# Patient Record
Sex: Female | Born: 1945 | Race: White | Hispanic: No | Smoking: Current every day smoker
Health system: Southern US, Community
[De-identification: ages and names within clinical notes are randomized; demographics above are authoritative.]

## PROBLEM LIST (undated history)

## (undated) DIAGNOSIS — C801 Malignant (primary) neoplasm, unspecified: Secondary | ICD-10-CM

## (undated) DIAGNOSIS — Z800 Family history of malignant neoplasm of digestive organs: Secondary | ICD-10-CM

## (undated) DIAGNOSIS — I10 Essential (primary) hypertension: Secondary | ICD-10-CM

## (undated) DIAGNOSIS — Z803 Family history of malignant neoplasm of breast: Secondary | ICD-10-CM

## (undated) DIAGNOSIS — Z720 Tobacco use: Secondary | ICD-10-CM

## (undated) HISTORY — DX: Family history of malignant neoplasm of digestive organs: Z80.0

## (undated) HISTORY — DX: Family history of malignant neoplasm of breast: Z80.3

## (undated) HISTORY — PX: HERNIA REPAIR: SHX51

## (undated) HISTORY — PX: OTHER SURGICAL HISTORY: SHX169

## (undated) HISTORY — PX: VESICO-VAGINAL FISTULA REPAIR: SHX5129

## (undated) HISTORY — PX: TONSILLECTOMY: SUR1361

---

## 1998-08-27 ENCOUNTER — Encounter: Payer: Self-pay | Admitting: Hematology and Oncology

## 1998-08-27 ENCOUNTER — Ambulatory Visit (HOSPITAL_COMMUNITY): Admission: RE | Admit: 1998-08-27 | Discharge: 1998-08-27 | Payer: Self-pay | Admitting: Hematology and Oncology

## 1998-12-26 ENCOUNTER — Other Ambulatory Visit: Admission: RE | Admit: 1998-12-26 | Discharge: 1998-12-26 | Payer: Self-pay | Admitting: *Deleted

## 1999-09-11 ENCOUNTER — Ambulatory Visit (HOSPITAL_COMMUNITY): Admission: RE | Admit: 1999-09-11 | Discharge: 1999-09-11 | Payer: Self-pay | Admitting: Hematology and Oncology

## 1999-09-11 ENCOUNTER — Encounter: Payer: Self-pay | Admitting: Hematology and Oncology

## 2002-07-13 ENCOUNTER — Ambulatory Visit (HOSPITAL_COMMUNITY): Admission: RE | Admit: 2002-07-13 | Discharge: 2002-07-13 | Payer: Self-pay | Admitting: Family Medicine

## 2002-07-13 ENCOUNTER — Encounter: Payer: Self-pay | Admitting: Family Medicine

## 2002-07-27 ENCOUNTER — Encounter: Payer: Self-pay | Admitting: Family Medicine

## 2002-07-27 ENCOUNTER — Ambulatory Visit (HOSPITAL_COMMUNITY): Admission: RE | Admit: 2002-07-27 | Discharge: 2002-07-27 | Payer: Self-pay | Admitting: Family Medicine

## 2004-01-09 ENCOUNTER — Ambulatory Visit (HOSPITAL_COMMUNITY): Admission: RE | Admit: 2004-01-09 | Discharge: 2004-01-09 | Payer: Self-pay | Admitting: Family Medicine

## 2007-06-22 ENCOUNTER — Ambulatory Visit (HOSPITAL_COMMUNITY): Admission: RE | Admit: 2007-06-22 | Discharge: 2007-06-22 | Payer: Self-pay | Admitting: Family Medicine

## 2007-08-19 ENCOUNTER — Encounter: Admission: RE | Admit: 2007-08-19 | Discharge: 2007-08-19 | Payer: Self-pay | Admitting: Family Medicine

## 2007-08-19 ENCOUNTER — Encounter (INDEPENDENT_AMBULATORY_CARE_PROVIDER_SITE_OTHER): Payer: Self-pay | Admitting: Radiology

## 2007-08-27 ENCOUNTER — Ambulatory Visit (HOSPITAL_COMMUNITY): Admission: RE | Admit: 2007-08-27 | Discharge: 2007-08-27 | Payer: Self-pay | Admitting: Surgery

## 2007-08-29 ENCOUNTER — Encounter: Admission: RE | Admit: 2007-08-29 | Discharge: 2007-08-29 | Payer: Self-pay | Admitting: Family Medicine

## 2007-09-16 ENCOUNTER — Encounter: Admission: RE | Admit: 2007-09-16 | Discharge: 2007-09-16 | Payer: Self-pay | Admitting: Surgery

## 2007-09-17 ENCOUNTER — Encounter (INDEPENDENT_AMBULATORY_CARE_PROVIDER_SITE_OTHER): Payer: Self-pay | Admitting: Surgery

## 2007-09-17 ENCOUNTER — Ambulatory Visit (HOSPITAL_BASED_OUTPATIENT_CLINIC_OR_DEPARTMENT_OTHER): Admission: RE | Admit: 2007-09-17 | Discharge: 2007-09-18 | Payer: Self-pay | Admitting: Surgery

## 2008-10-24 ENCOUNTER — Ambulatory Visit (HOSPITAL_COMMUNITY): Admission: RE | Admit: 2008-10-24 | Discharge: 2008-10-24 | Payer: Self-pay | Admitting: Family Medicine

## 2010-11-03 ENCOUNTER — Encounter: Payer: Self-pay | Admitting: Family Medicine

## 2010-11-03 ENCOUNTER — Encounter: Payer: Self-pay | Admitting: Internal Medicine

## 2010-11-04 ENCOUNTER — Encounter: Payer: Self-pay | Admitting: Family Medicine

## 2011-02-25 NOTE — Op Note (Signed)
NAME:  CLIMMIE, CRONCE                ACCOUNT NO.:  [id number]   MEDICAL RECORD NO.:  [id number]          PATIENT TYPE:  AMB   LOCATION:  DSC                          FACILITY:  [facility]   PHYSICIAN:  Currie Paris, M.D.DATE OF BIRTH:  1945/10/14   DATE OF PROCEDURE:  09/17/2007  DATE OF DISCHARGE:                               OPERATIVE REPORT   OFFICE MEDICAL RECORD NUMBER:  [id number]   PREOPERATIVE DIAGNOSIS:  Carcinoma, left breast, upper outer quadrant.   POSTOPERATIVE DIAGNOSIS:  Carcinoma, left breast, upper outer quadrant.   OPERATION:  Left total mastectomy.   SURGEON:  Currie Paris, M.D.   ASSISTANT:  Lennie Muckle, M.D.   ANESTHESIA:  General endotracheal.   CLINICAL HISTORY:  This is a 65 year old lady, status post a left  lumpectomy and axillary dissection for a stage II left breast cancer  treated many years ago.  She now has a new left breast cancer and  elected to proceed to mastectomy.  A preoperative sentinel node scan  showed no evidence of sentinel nodes and no plan for sentinel node  evaluation was made for today.  She has already had a prior node  dissection on this side.   DESCRIPTION OF PROCEDURE:  The patient was seen in the holding area and  she had no further questions.   The patient taken to the operating room and after satisfactory general  anesthesia had been obtained, the left breast was prepped and draped.  The time-out was performed.   I outlined an elliptical incision, trying to get fairly wide in the  upper outer quadrant to make sure I was well over the area of the new  tumor.  I raised a skin flap medially to the sternum, superiorly towards  the clavicle, laterally into the axilla and to the latissimus.  I then  made the inferior flap going to just beyond the inframammary fold and  then laterally to the latissimus; this was all done with cautery.  The  breast was then removed, starting medially and working laterally, and  taking the fascia.  I opened the clavipectoral fascia and entered the  axilla, but there were basically no axillary contents present, since she  had had a previous dissection.  I did identify both the long thoracic  and thoracodorsal nerves and there was really no intervening tissue  here.  There was a questionable small node just at the edge of the  pectoralis, which I took out.   The breast was then disconnected from its lateral attachments on the  latissimus; this was sent to Pathology.   The wound was irrigated and checked for hemostasis.  Once I was sure  everything was dry, I placed a 19 Blake drain laterally and it go up  towards the axilla and then over the pectoralis.  A last check was made  for hemostasis and then the incision closed with staples.  The patient  tolerated the procedure well.  There were no operative complications.  All counts were correct.      Currie Paris, M.D.  Electronically Signed  CJS/MEDQ  D:  09/17/2007  T:  09/18/2007  Job:  [id number]   cc:   Corrie Mckusick, M.D.  Lynett Fish, M.D.

## 2011-07-21 LAB — URINALYSIS, ROUTINE W REFLEX MICROSCOPIC
Bilirubin Urine: NEGATIVE
Glucose, UA: NEGATIVE
Ketones, ur: NEGATIVE
Nitrite: NEGATIVE
Protein, ur: NEGATIVE
Specific Gravity, Urine: 1.024
pH: 6

## 2011-07-21 LAB — COMPREHENSIVE METABOLIC PANEL
AST: 17
Albumin: 3.6
BUN: 14
Calcium: 9.3
Creatinine, Ser: 0.77
Glucose, Bld: 141 — ABNORMAL HIGH
Potassium: 4.4
Sodium: 141
Total Bilirubin: 0.6

## 2011-07-21 LAB — CBC
HCT: 38
Hemoglobin: 12.9
MCHC: 33.9
Platelets: 348
RBC: 4.5
WBC: 7

## 2011-07-21 LAB — DIFFERENTIAL
Basophils Absolute: 0.1
Eosinophils Absolute: 0.1 — ABNORMAL LOW
Lymphocytes Relative: 22
Lymphs Abs: 1.6
Monocytes Absolute: 0.6

## 2012-05-27 NOTE — H&P (Signed)
  NTS SOAP Note  Vital Signs:  Vitals as of: 05/27/2012: Systolic 165: Diastolic 106: Heart Rate 100: Temp 97.7F: Height 38ft 1in: Weight 168Lbs 0 Ounces: BMI 32  BMI : 31.74 kg/m2  Subjective: This 66 Years 1 Months old Female presents for screening TCS.  Has not had a colonoscopy in the recent past.  Denies blood per rectum.  No family h/o colon carcinoma.  Also has some dysphagia, with swallowing problems at the cervical region.  No nausea, vomiting.   Review of Symptoms:  Constitutional:unremarkable   Head:unremarkable    Eyes:unremarkable   Nose/Mouth/Throat:unremarkable Cardiovascular:  unremarkable   Respiratory:unremarkable   Genitourinary:unremarkable     Musculoskeletal:unremarkable   Skin:unremarkable Hematolgic/Lymphatic:unremarkable     Allergic/Immunologic:unremarkable     Past Medical History:    Reviewed   Past Medical History  Surgical History: mastectomy, fistula repair from childbirth Medical Problems:  Diabetes, High Blood pressure Allergies: duricef Medications: diovan, janumet, glipizide   Social History:Reviewed  Social History  Preferred Language: English ([place]) Race:  White Ethnicity: Not Hispanic / Latino Age: 66 Years 1 Months Marital Status:  D Alcohol: rarely Recreational drug(s):  No   Smoking Status: Current every day smoker reviewed on 05/27/2012 Started Date: 10/14/1967 Packs per day: 0.50   Family History:  Reviewed   Family History  Is there a family history of:No family h/o colon cancer    Objective Information: General:  Well appearing, well nourished in no distress. Neck:  Supple without lymphadenopathy.  Heart:  RRR, no murmur Lungs:    CTA bilaterally, no wheezes, rhonchi, rales.  Breathing unlabored. Abdomen:Soft, NT/ND, no HSM, no masses.   deferred to procedure  Assessment:Dysphagia, need for screening TCS  Diagnosis &amp;  Procedure: DiagnosisCode: V76.51, ProcedureCode: 16109, DiagnosisCode: 787.20,    Plan:Scheduled for EGD, TCS on 06/04/12.   Patient Education:Alternative treatments to surgery were discussed with patient (and family).  Risks and benefits  of procedure were fully explained to the patient (and family) who gave informed consent. Patient/family questions were addressed.  Follow-up:Pending Surgery

## 2012-06-03 MED ORDER — SODIUM CHLORIDE 0.9 % IV SOLN: INTRAVENOUS | Status: DC

## 2012-06-03 MED ORDER — SODIUM CHLORIDE 0.45 % IV SOLN
INTRAVENOUS | Status: DC
  Administered 2012-06-04: 1000 mL via INTRAVENOUS

## 2012-06-04 ENCOUNTER — Ambulatory Visit (HOSPITAL_COMMUNITY)
Admission: RE | Admit: 2012-06-04 | Discharge: 2012-06-04 | Disposition: A | Discharge status: Home / Self Care | Payer: 59 | Source: Ambulatory Visit | Attending: General Surgery | Admitting: General Surgery

## 2012-06-04 ENCOUNTER — Encounter (HOSPITAL_COMMUNITY): Payer: Self-pay | Admitting: *Deleted

## 2012-06-04 ENCOUNTER — Encounter (HOSPITAL_COMMUNITY)
Admission: RE | Disposition: A | Discharge status: Home / Self Care | Payer: Self-pay | Source: Ambulatory Visit | Attending: General Surgery

## 2012-06-04 DIAGNOSIS — R12 Heartburn: Secondary | ICD-10-CM | POA: Insufficient documentation

## 2012-06-04 DIAGNOSIS — K449 Diaphragmatic hernia without obstruction or gangrene: Secondary | ICD-10-CM | POA: Insufficient documentation

## 2012-06-04 DIAGNOSIS — E119 Type 2 diabetes mellitus without complications: Secondary | ICD-10-CM | POA: Insufficient documentation

## 2012-06-04 DIAGNOSIS — I10 Essential (primary) hypertension: Secondary | ICD-10-CM | POA: Insufficient documentation

## 2012-06-04 DIAGNOSIS — K222 Esophageal obstruction: Secondary | ICD-10-CM | POA: Insufficient documentation

## 2012-06-04 DIAGNOSIS — Z1211 Encounter for screening for malignant neoplasm of colon: Secondary | ICD-10-CM | POA: Insufficient documentation

## 2012-06-04 DIAGNOSIS — A048 Other specified bacterial intestinal infections: Secondary | ICD-10-CM | POA: Insufficient documentation

## 2012-06-04 DIAGNOSIS — K5730 Diverticulosis of large intestine without perforation or abscess without bleeding: Secondary | ICD-10-CM | POA: Insufficient documentation

## 2012-06-04 HISTORY — PX: COLONOSCOPY: SHX5424

## 2012-06-04 HISTORY — DX: Essential (primary) hypertension: I10

## 2012-06-04 HISTORY — DX: Malignant (primary) neoplasm, unspecified: C80.1

## 2012-06-04 HISTORY — PX: ESOPHAGOGASTRODUODENOSCOPY: SHX5428

## 2012-06-04 LAB — GLUCOSE, CAPILLARY
Glucose-Capillary: 181 mg/dL — ABNORMAL HIGH (ref 70–99)
Glucose-Capillary: 140 mg/dL — ABNORMAL HIGH (ref 70–99)

## 2012-06-04 SURGERY — EGD (ESOPHAGOGASTRODUODENOSCOPY)
Anesthesia: Moderate Sedation

## 2012-06-04 MED ORDER — MEPERIDINE HCL 25 MG/ML IJ SOLN
INTRAMUSCULAR | Status: DC | PRN
  Administered 2012-06-04: 50 mg via INTRAVENOUS

## 2012-06-04 MED ORDER — MIDAZOLAM HCL 5 MG/5ML IJ SOLN
INTRAMUSCULAR | Status: DC | PRN
  Administered 2012-06-04: 1 mg via INTRAVENOUS
  Administered 2012-06-04: 3 mg via INTRAVENOUS

## 2012-06-04 MED ORDER — BUTAMBEN-TETRACAINE-BENZOCAINE 2-2-14 % EX AERO
INHALATION_SPRAY | CUTANEOUS | Status: DC | PRN
  Administered 2012-06-04: 2 via TOPICAL

## 2012-06-04 MED ORDER — MIDAZOLAM HCL 5 MG/5ML IJ SOLN
INTRAMUSCULAR | Status: AC
  Filled 2012-06-04: qty 10

## 2012-06-04 MED ORDER — OMEPRAZOLE 20 MG PO CPDR: 20.0000 mg | DELAYED_RELEASE_CAPSULE | Freq: Every day | ORAL | Status: DC

## 2012-06-04 MED ORDER — MEPERIDINE HCL 50 MG/ML IJ SOLN
INTRAMUSCULAR | Status: AC
  Filled 2012-06-04: qty 2

## 2012-06-04 MED ORDER — MEPERIDINE HCL 50 MG/ML IJ SOLN
INTRAMUSCULAR | Status: AC
  Filled 2012-06-04: qty 1

## 2012-06-04 NOTE — Op Note (Signed)
[facility] [address] [place], [address]   ENDOSCOPY PROCEDURE REPORT  PATIENT: Kristine Rodriguez, Kristine Rodriguez  MR#: [id number] BIRTHDATE: 1946-08-16 , 66  yrs. old GENDER: Female ENDOSCOPIST: Franky Macho, MD REFERRED BY:  Assunta Found PROCEDURE DATE:  06/04/2012 PROCEDURE:  EGD w/ biopsy for H.pylori ASA CLASS:     Class II INDICATIONS:  heartburn. MEDICATIONS: Versed 4 mg IV, Demerol 50 mg IV, and Cetacaine spray x 2 TOPICAL ANESTHETIC: Cetacaine Spray  DESCRIPTION OF PROCEDURE: After the risks benefits and alternatives of the procedure were thoroughly explained, informed consent was obtained.  The EC-3890Li ([id number]) and Pentax Gastroscope [id number] endoscope was introduced through the mouth and advanced to the second portion of the duodenum. Without limitations.  The instrument was slowly withdrawn as the mucosa was fully examined.      STOMACH: The mucosa of the stomach appeared normall.  The pylorus was noted to be widely patent.    the stomach was easily distensible. On retroflexion of the scope, a sliding hiatal hernia was noted. A prepyloric biopsy was taken for a CLOtest. The Z line was noted to be at 35 cm from the teeth, the gastroesophageal junction was noted be at 38 cm from the teeth. A noncritical Schatzki's ring was noted. The esophagus was within normal limits. No evidence of Barrett's esophagitis. All air was evacuated from the stomach and esophagus prior to removal the endoscope.     The scope was then withdrawn from the patient and the procedure completed.  COMPLICATIONS: There were no complications. ENDOSCOPIC IMPRESSION: T:  hiatal hernia  RECOMMENDATIONS: await biopsy results    eSigned:  Franky Macho, MD 06/04/2012 10:46 AM   CC:

## 2012-06-04 NOTE — Op Note (Signed)
[facility] [address] [place], [address]   COLONOSCOPY PROCEDURE REPORT  PATIENT: Kristine Rodriguez, Kristine Rodriguez  MR#: [id number] BIRTHDATE: Jul 11, 1946 , 66  yrs. old GENDER: Female ENDOSCOPIST: Franky Macho, MD REFERRED NF:AOZHYQM, John PROCEDURE DATE:  06/04/2012 PROCEDURE:   Colonoscopy, screening ASA CLASS:   Class II INDICATIONS:average risk patient for colon cancer. MEDICATIONS: Demerol 50 mg IV and Versed 4 mg IV  DESCRIPTION OF PROCEDURE:   After the risks benefits and alternatives of the procedure were thoroughly explained, informed consent was obtained.  A digital rectal exam revealed no abnormalities of the rectum.   The EC-[id number] ([id number])  endoscope was introduced through the anus and advanced to the cecum, which was identified by both the appendix and ileocecal valve. No adverse events experienced.   The quality of the prep was adequate, using Trilyte  The instrument was then slowly withdrawn as the colon was fully examined.      COLON FINDINGS: Moderate diverticulosis was noted in the descending colon and sigmoid colon.  Retroflexed views revealed no abnormalities. The time to cecum=6 minutes 0 seconds  Withdrawal time=6 minutes 0 seconds.  The scope was withdrawn and the procedure completed. COMPLICATIONS: There were no complications. ENDOSCOPIC IMPRESSION: Moderate diverticulosis was noted in the descending colon and sigmoid colon  RECOMMENDATIONS: repeat Colonscopy in 10 years.  eSigned:  Franky Macho, MD 06/04/2012 10:39 AM   cc:

## 2012-06-04 NOTE — Interval H&P Note (Signed)
History and Physical Interval Note:  06/04/2012 9:55 AM  Kristine Rodriguez  has presented today for surgery, with the diagnosis of Dysphagia Special screening for malignant neoplasms, colon   The various methods of treatment have been discussed with the patient and family. After consideration of risks, benefits and other options for treatment, the patient has consented to  Procedure(s) (LRB): ESOPHAGOGASTRODUODENOSCOPY (EGD) (N/A) COLONOSCOPY (N/A) as a surgical intervention .  The patient's history has been reviewed, patient examined, no change in status, stable for surgery.  I have reviewed the patient's chart and labs.  Questions were answered to the patient's satisfaction.     Franky Macho A

## 2012-06-08 ENCOUNTER — Encounter (HOSPITAL_COMMUNITY): Payer: Self-pay | Admitting: General Surgery

## 2012-10-26 ENCOUNTER — Other Ambulatory Visit (HOSPITAL_COMMUNITY): Payer: Self-pay | Admitting: Family Medicine

## 2012-10-26 DIAGNOSIS — Z139 Encounter for screening, unspecified: Secondary | ICD-10-CM

## 2012-11-01 ENCOUNTER — Other Ambulatory Visit (HOSPITAL_COMMUNITY): Payer: 59

## 2014-10-27 ENCOUNTER — Other Ambulatory Visit (HOSPITAL_COMMUNITY): Payer: Self-pay | Admitting: Family Medicine

## 2014-10-27 DIAGNOSIS — IMO0002 Reserved for concepts with insufficient information to code with codable children: Secondary | ICD-10-CM

## 2014-10-27 DIAGNOSIS — E782 Mixed hyperlipidemia: Secondary | ICD-10-CM

## 2014-10-27 DIAGNOSIS — E1165 Type 2 diabetes mellitus with hyperglycemia: Secondary | ICD-10-CM

## 2014-10-27 DIAGNOSIS — I10 Essential (primary) hypertension: Secondary | ICD-10-CM

## 2014-11-02 ENCOUNTER — Ambulatory Visit (HOSPITAL_COMMUNITY)
Admission: RE | Admit: 2014-11-02 | Discharge: 2014-11-02 | Disposition: A | Discharge status: Home / Self Care | Payer: 59 | Source: Ambulatory Visit | Attending: Family Medicine | Admitting: Family Medicine

## 2014-11-02 DIAGNOSIS — M810 Age-related osteoporosis without current pathological fracture: Secondary | ICD-10-CM | POA: Diagnosis not present

## 2014-11-02 DIAGNOSIS — Z13820 Encounter for screening for osteoporosis: Secondary | ICD-10-CM | POA: Diagnosis present

## 2014-11-02 DIAGNOSIS — E782 Mixed hyperlipidemia: Secondary | ICD-10-CM

## 2014-11-02 DIAGNOSIS — IMO0002 Reserved for concepts with insufficient information to code with codable children: Secondary | ICD-10-CM

## 2014-11-02 DIAGNOSIS — E559 Vitamin D deficiency, unspecified: Secondary | ICD-10-CM | POA: Insufficient documentation

## 2014-11-02 DIAGNOSIS — Z780 Asymptomatic menopausal state: Secondary | ICD-10-CM | POA: Diagnosis not present

## 2014-11-02 DIAGNOSIS — Z853 Personal history of malignant neoplasm of breast: Secondary | ICD-10-CM | POA: Insufficient documentation

## 2014-11-02 DIAGNOSIS — I10 Essential (primary) hypertension: Secondary | ICD-10-CM

## 2014-11-02 DIAGNOSIS — E1165 Type 2 diabetes mellitus with hyperglycemia: Secondary | ICD-10-CM

## 2016-01-25 DIAGNOSIS — Z1389 Encounter for screening for other disorder: Secondary | ICD-10-CM | POA: Diagnosis not present

## 2016-01-25 DIAGNOSIS — F329 Major depressive disorder, single episode, unspecified: Secondary | ICD-10-CM | POA: Diagnosis not present

## 2016-01-25 DIAGNOSIS — E1165 Type 2 diabetes mellitus with hyperglycemia: Secondary | ICD-10-CM | POA: Diagnosis not present

## 2016-01-25 DIAGNOSIS — E663 Overweight: Secondary | ICD-10-CM | POA: Diagnosis not present

## 2016-01-25 DIAGNOSIS — Z6828 Body mass index (BMI) 28.0-28.9, adult: Secondary | ICD-10-CM | POA: Diagnosis not present

## 2016-01-25 DIAGNOSIS — F4321 Adjustment disorder with depressed mood: Secondary | ICD-10-CM | POA: Diagnosis not present

## 2016-01-25 DIAGNOSIS — I10 Essential (primary) hypertension: Secondary | ICD-10-CM | POA: Diagnosis not present

## 2016-01-25 DIAGNOSIS — E669 Obesity, unspecified: Secondary | ICD-10-CM | POA: Diagnosis not present

## 2016-07-22 DIAGNOSIS — E782 Mixed hyperlipidemia: Secondary | ICD-10-CM | POA: Diagnosis not present

## 2016-07-22 DIAGNOSIS — I10 Essential (primary) hypertension: Secondary | ICD-10-CM | POA: Diagnosis not present

## 2016-07-22 DIAGNOSIS — Z1389 Encounter for screening for other disorder: Secondary | ICD-10-CM | POA: Diagnosis not present

## 2016-07-22 DIAGNOSIS — Z6828 Body mass index (BMI) 28.0-28.9, adult: Secondary | ICD-10-CM | POA: Diagnosis not present

## 2016-07-22 DIAGNOSIS — E1165 Type 2 diabetes mellitus with hyperglycemia: Secondary | ICD-10-CM | POA: Diagnosis not present

## 2016-08-07 DIAGNOSIS — E1165 Type 2 diabetes mellitus with hyperglycemia: Secondary | ICD-10-CM | POA: Diagnosis not present

## 2016-08-07 DIAGNOSIS — E782 Mixed hyperlipidemia: Secondary | ICD-10-CM | POA: Diagnosis not present

## 2016-11-24 DIAGNOSIS — E1165 Type 2 diabetes mellitus with hyperglycemia: Secondary | ICD-10-CM | POA: Diagnosis not present

## 2016-11-24 DIAGNOSIS — E663 Overweight: Secondary | ICD-10-CM | POA: Diagnosis not present

## 2016-11-24 DIAGNOSIS — Z23 Encounter for immunization: Secondary | ICD-10-CM | POA: Diagnosis not present

## 2016-11-24 DIAGNOSIS — Z6827 Body mass index (BMI) 27.0-27.9, adult: Secondary | ICD-10-CM | POA: Diagnosis not present

## 2017-07-08 DIAGNOSIS — Z6825 Body mass index (BMI) 25.0-25.9, adult: Secondary | ICD-10-CM | POA: Diagnosis not present

## 2017-07-08 DIAGNOSIS — E782 Mixed hyperlipidemia: Secondary | ICD-10-CM | POA: Diagnosis not present

## 2017-07-08 DIAGNOSIS — E559 Vitamin D deficiency, unspecified: Secondary | ICD-10-CM | POA: Diagnosis not present

## 2017-07-08 DIAGNOSIS — I10 Essential (primary) hypertension: Secondary | ICD-10-CM | POA: Diagnosis not present

## 2017-07-08 DIAGNOSIS — D508 Other iron deficiency anemias: Secondary | ICD-10-CM | POA: Diagnosis not present

## 2017-07-08 DIAGNOSIS — E1165 Type 2 diabetes mellitus with hyperglycemia: Secondary | ICD-10-CM | POA: Diagnosis not present

## 2017-07-08 DIAGNOSIS — Z23 Encounter for immunization: Secondary | ICD-10-CM | POA: Diagnosis not present

## 2017-07-08 DIAGNOSIS — Z1389 Encounter for screening for other disorder: Secondary | ICD-10-CM | POA: Diagnosis not present

## 2017-07-08 DIAGNOSIS — J449 Chronic obstructive pulmonary disease, unspecified: Secondary | ICD-10-CM | POA: Diagnosis not present

## 2017-07-08 LAB — HEMOGLOBIN A1C: Hemoglobin A1C: 8.3

## 2017-07-10 DIAGNOSIS — E663 Overweight: Secondary | ICD-10-CM | POA: Diagnosis not present

## 2017-07-10 DIAGNOSIS — Z1389 Encounter for screening for other disorder: Secondary | ICD-10-CM | POA: Diagnosis not present

## 2017-07-10 DIAGNOSIS — E782 Mixed hyperlipidemia: Secondary | ICD-10-CM | POA: Diagnosis not present

## 2017-07-10 DIAGNOSIS — Z23 Encounter for immunization: Secondary | ICD-10-CM | POA: Diagnosis not present

## 2017-07-10 DIAGNOSIS — Z6825 Body mass index (BMI) 25.0-25.9, adult: Secondary | ICD-10-CM | POA: Diagnosis not present

## 2017-07-10 DIAGNOSIS — E1165 Type 2 diabetes mellitus with hyperglycemia: Secondary | ICD-10-CM | POA: Diagnosis not present

## 2017-07-13 LAB — BASIC METABOLIC PANEL
BUN: 26 — AB (ref 4–21)
CREATININE: 0.8 (ref <=1.1)

## 2017-07-13 LAB — LIPID PANEL
Cholesterol: 208 — AB (ref 0–200)
HDL: 47 (ref 35–70)
LDL Cholesterol: 84
Triglycerides: 383 — AB (ref 40–160)

## 2017-08-06 ENCOUNTER — Other Ambulatory Visit (HOSPITAL_COMMUNITY): Payer: Self-pay | Admitting: Family Medicine

## 2017-08-06 DIAGNOSIS — Z87891 Personal history of nicotine dependence: Secondary | ICD-10-CM

## 2017-08-06 DIAGNOSIS — Z122 Encounter for screening for malignant neoplasm of respiratory organs: Secondary | ICD-10-CM

## 2017-08-12 ENCOUNTER — Ambulatory Visit (INDEPENDENT_AMBULATORY_CARE_PROVIDER_SITE_OTHER): Payer: PPO | Admitting: "Endocrinology

## 2017-08-12 ENCOUNTER — Encounter: Payer: Self-pay | Admitting: "Endocrinology

## 2017-08-12 VITALS — BP 133/82 | HR 97 | Ht 61.0 in | Wt 137.0 lb

## 2017-08-12 DIAGNOSIS — I10 Essential (primary) hypertension: Secondary | ICD-10-CM | POA: Diagnosis not present

## 2017-08-12 DIAGNOSIS — E1165 Type 2 diabetes mellitus with hyperglycemia: Secondary | ICD-10-CM | POA: Diagnosis not present

## 2017-08-12 DIAGNOSIS — F17200 Nicotine dependence, unspecified, uncomplicated: Secondary | ICD-10-CM | POA: Insufficient documentation

## 2017-08-12 DIAGNOSIS — E782 Mixed hyperlipidemia: Secondary | ICD-10-CM | POA: Diagnosis not present

## 2017-08-12 MED ORDER — METFORMIN HCL 1000 MG PO TABS: 1000.0000 mg | ORAL_TABLET | Freq: Two times a day (BID) | ORAL | 2 refills | Status: DC

## 2017-08-12 NOTE — Patient Instructions (Signed)

## 2017-08-12 NOTE — Progress Notes (Signed)
Consult Note       08/12/2017, 5:20 PM   Subjective:    Patient ID: Kristine Rodriguez, female    DOB: [birth year].  she is being seen in consultation for management of currently uncontrolled symptomatic diabetes requested by  Sharilyn Sites, MD.   Past Medical History:  Diagnosis Date  . Cancer ([place])    hx of breast cancer left  . Diabetes mellitus   . Hypertension    Past Surgical History:  Procedure Laterality Date  . bladder tack    . COLONOSCOPY  06/04/2012   Procedure: COLONOSCOPY;  Surgeon: Jamesetta So, MD;  Location: [facility];  Service: Gastroenterology;  Laterality: N/A;  . ESOPHAGOGASTRODUODENOSCOPY  06/04/2012   Procedure: ESOPHAGOGASTRODUODENOSCOPY (EGD);  Surgeon: Jamesetta So, MD;  Location: [facility];  Service: Gastroenterology;  Laterality: N/A;  . HERNIA REPAIR    . left mastectomy    . TONSILLECTOMY    . VESICO-VAGINAL FISTULA REPAIR     Social History   Social History  . Marital status: Divorced    Spouse name: N/A  . Number of children: N/A  . Years of education: N/A   Social History Main Topics  . Smoking status: Current Every Day Smoker    Packs/day: 0.50    Types: Cigarettes  . Smokeless tobacco: Never Used  . Alcohol use Yes     Comment: socially drink  . Drug use: No  . Sexual activity: Yes   Other Topics Concern  . None   Social History Narrative  . None   Outpatient Encounter Prescriptions as of 08/12/2017  Medication Sig  . albuterol (PROVENTIL) (2.5 MG/3ML) 0.083% nebulizer solution Take 2.5 mg by nebulization every 6 (six) hours as needed for wheezing or shortness of breath.  . Fexofenadine HCl (MUCINEX ALLERGY PO) Take by mouth.  . metFORMIN (GLUCOPHAGE) 1000 MG tablet Take 1 tablet (1,000 mg total) by mouth 2 (two) times daily with a meal.  . Omega-3 Fatty Acids (FISH OIL OMEGA-3 PO) Take by mouth daily.  . valsartan-hydrochlorothiazide  (DIOVAN-HCT) 160-12.5 MG tablet Take 1 tablet by mouth daily.  . Vitamin D, Ergocalciferol, (DRISDOL) 50000 units CAPS capsule Take 50,000 Units by mouth every 7 (seven) days.  . [DISCONTINUED] glipiZIDE (GLUCOTROL) 10 MG tablet Take 10 mg by mouth 1 day or 1 dose.  . [DISCONTINUED] metFORMIN (GLUCOPHAGE) 1000 MG tablet Take 1,000 mg by mouth 2 (two) times daily with a meal.  . omeprazole (PRILOSEC) 20 MG capsule Take 1 capsule (20 mg total) by mouth daily.  . [DISCONTINUED] sitaGLIPtan-metformin (JANUMET) 50-1000 MG per tablet Take 1 tablet by mouth 2 (two) times daily with a meal.  . [DISCONTINUED] valsartan (DIOVAN) 160 MG tablet Take 160 mg by mouth daily.   No facility-administered encounter medications on file as of 08/12/2017.     ALLERGIES: Allergies  Allergen Reactions  . Duricef [Cefadroxil] Shortness Of Breath and Swelling  . Tape Rash    VACCINATION STATUS:  There is no immunization history on file for this patient.  Diabetes  She presents for her initial diabetic visit. She has type 2 diabetes mellitus. Onset  time: She was diagnosed at approximate age of 37 years. Her disease course has been worsening. There are no hypoglycemic associated symptoms. Pertinent negatives for hypoglycemia include no confusion, headaches, pallor or seizures. Associated symptoms include fatigue, polydipsia and polyuria. Pertinent negatives for diabetes include no chest pain and no polyphagia. There are no hypoglycemic complications. Symptoms are worsening. There are no diabetic complications. Risk factors for coronary artery disease include diabetes mellitus, dyslipidemia, family history, hypertension, sedentary lifestyle, tobacco exposure and post-menopausal. Current diabetic treatment includes oral agent (dual therapy). Her weight is decreasing steadily. She is following a generally unhealthy diet. When asked about meal planning, she reported none. She has not had a previous visit with a dietitian.  She participates in exercise intermittently. (She did not bring any meter nor logs to review today. She admits that she does not monitor blood glucose regularly. Her most recent A1c was 8.3% from 07/08/2017.) An ACE inhibitor/angiotensin II receptor blocker is being taken. She does not see a podiatrist.Eye exam is current.  Hyperlipidemia  This is a chronic problem. The current episode started more than 1 year ago. Pertinent negatives include no chest pain, myalgias or shortness of breath. Current antihyperlipidemic treatment includes fibric acid derivatives. Risk factors for coronary artery disease include diabetes mellitus, dyslipidemia, family history, hypertension and a sedentary lifestyle.  Hypertension  This is a chronic problem. The current episode started more than 1 year ago. The problem is controlled. Pertinent negatives include no chest pain, headaches, palpitations or shortness of breath. Risk factors for coronary artery disease include diabetes mellitus, dyslipidemia, smoking/tobacco exposure, sedentary lifestyle and family history. Past treatments include angiotensin blockers.      Review of Systems  Constitutional: Positive for fatigue. Negative for chills, fever and unexpected weight change.  HENT: Negative for trouble swallowing and voice change.   Eyes: Negative for visual disturbance.  Respiratory: Negative for cough, shortness of breath and wheezing.   Cardiovascular: Negative for chest pain, palpitations and leg swelling.  Gastrointestinal: Negative for diarrhea, nausea and vomiting.  Endocrine: Positive for polydipsia and polyuria. Negative for cold intolerance, heat intolerance and polyphagia.  Musculoskeletal: Negative for arthralgias and myalgias.  Skin: Negative for color change, pallor, rash and wound.  Neurological: Negative for seizures and headaches.  Psychiatric/Behavioral: Negative for confusion and suicidal ideas.    Objective:    BP 133/82   Pulse 97   Ht  5' 1"  (1.549 m)   Wt 137 lb (62.1 kg)   BMI 25.89 kg/m   Wt Readings from Last 3 Encounters:  08/12/17 137 lb (62.1 kg)  06/04/12 165 lb (74.8 kg)     Physical Exam  Constitutional: She is oriented to person, place, and time. She appears well-developed.  HENT:  Head: Normocephalic and atraumatic.  Eyes: EOM are normal.  Neck: Normal range of motion. Neck supple. No tracheal deviation present. No thyromegaly present.  Cardiovascular: Normal rate and regular rhythm.   Pulmonary/Chest: Effort normal and breath sounds normal.  Abdominal: Soft. Bowel sounds are normal. There is no tenderness. There is no guarding.  Musculoskeletal: Normal range of motion. She exhibits no edema.  Neurological: She is alert and oriented to person, place, and time. She has normal reflexes. No cranial nerve deficit. Coordination normal.  Skin: Skin is warm and dry. No rash noted. No erythema. No pallor.  Psychiatric: She has a normal mood and affect. Judgment normal.      CMP ( most recent) CMP     Component Value Date/Time  NA 141 09/16/2007 1025   K 4.4 09/16/2007 1025   CL 103 09/16/2007 1025   CO2 28 09/16/2007 1025   GLUCOSE 141 (H) 09/16/2007 1025   BUN 26 (A) 07/13/2017   CREATININE 0.8 07/13/2017   CREATININE 0.77 09/16/2007 1025   CALCIUM 9.3 09/16/2007 1025   PROT 6.8 09/16/2007 1025   ALBUMIN 3.6 09/16/2007 1025   AST 17 09/16/2007 1025   ALT 17 09/16/2007 1025   ALKPHOS 64 09/16/2007 1025   BILITOT 0.6 09/16/2007 1025   GFRNONAA >60 09/16/2007 1025   GFRAA  09/16/2007 1025    >60        The eGFR has been calculated using the MDRD equation. This calculation has not been validated in all clinical   Recent Results (from the past 2160 hour(s))  Hemoglobin A1c     Status: None   Collection Time: 07/08/17 12:00 AM  Result Value Ref Range   Hemoglobin A1C 8.3   Basic metabolic panel     Status: Abnormal   Collection Time: 07/13/17 12:00 AM  Result Value Ref Range   BUN 26 (A)  4 - 21   Creatinine 0.8 0.5 - 1.1  Lipid panel     Status: Abnormal   Collection Time: 07/13/17 12:00 AM  Result Value Ref Range   Triglycerides 383 (A) 40 - 160   Cholesterol 208 (A) 0 - 200   HDL 47 35 - 70   LDL Cholesterol 84       Assessment & Plan:   1. Uncontrolled type 2 diabetes mellitus with hyperglycemia ([place])   - Patient has currently uncontrolled symptomatic type 2 DM since  71 years of age,  with most recent A1c of 8.3 %. Recent labs reviewed.  -her diabetes is complicated by chronic heavy smoking, sedentary life and ASTOU LADA remains at a high risk for more acute and chronic complications which include CAD, CVA, CKD, retinopathy, and neuropathy. These are all discussed in detail with the patient.  - I have counseled her on diet management by adopting a carbohydrate restricted/protein rich diet.  - Suggestion is made for her to avoid simple carbohydrates  from her diet including Cakes, Sweet Desserts, Ice Cream, Soda (diet and regular), Sweet Tea, Candies, Chips, Cookies, Store Bought Juices, Alcohol in Excess of  1-2 drinks a day, Artificial Sweeteners, and "Sugar-free" Products. This will help patient to have stable blood glucose profile and potentially avoid unintended weight gain.  - I encouraged her to switch to  unprocessed or minimally processed complex starch and increased protein intake (animal or plant source), fruits, and vegetables.  - she is advised to stick to a routine mealtimes to eat 3 meals  a day and avoid unnecessary snacks ( to snack only to correct hypoglycemia).   - she will be scheduled with Jearld Fenton, RDN, CDE for individualized diabetes education.  - I have approached her with the following individualized plan to manage diabetes and patient agrees:   - I  will proceed to initiate strict monitoring of glucose 4 times a day-before meals and at bedtime, and return in one week with meter and logs to review. - She'll be considered for  brief insulin treatment if her glycemic profile is significantly above target.  -Patient is encouraged to call clinic for blood glucose levels less than 70 or above 300 mg /dl. - I will continue metformin 1000 g by mouth twice a day, therapeutically suitable for patient . - I will discontinue glipizide,  risk outweighs benefit for this patient.  - she will be considered for incretin therapy as appropriate next visit. - Patient specific target  A1c;  LDL, HDL, Triglycerides, and  Waist Circumference were discussed in detail.  2) BP/HTN: Controlled. Continue current medications including ARB. 3) Lipids/HPL:   Uncontrolled, patient is not on a statin. She is on omega-3 fatty acids given triglyceride level of 383. She'll be considered for low-dose statin  next visit. 4)  Weight/Diet: CDE Consult will be initiated , exercise, and detailed carbohydrates information provided.  5) Chronic Care/Health Maintenance:  -she  is on ARB and not on  Statin medications and  is encouraged to continue to follow up with Ophthalmology, Dentist,  Podiatrist at least yearly or according to recommendations, and advised to  quit smoking. I have recommended yearly flu vaccine and pneumonia vaccination at least every 5 years; moderate intensity exercise for up to 150 minutes weekly; and  sleep for at least 7 hours a day.  - Time spent with the patient: 1 hour, of which >50% was spent in obtaining information about her symptoms, reviewing her previous labs, evaluations, and treatments, counseling her about her   currently uncontrolled type 2 diabetes, hyperlipidemia, and hypertension, and developing a plan for long term treatment; her  questions were answered to her satisfaction.  - Patient to bring meter and  blood glucose logs during her next visit.  - I advised patient to maintain close follow up with Sharilyn Sites, MD for primary care needs.  Follow up plan: - Return in about 1 week (around 08/19/2017) for follow up  with meter and logs- no labs.  Glade Lloyd, MD [facility] [address] [place], [place] [address] Phone: [phone]  Fax: [phone]    08/12/2017, 5:20 PM  This note was partially dictated with voice recognition software. Similar sounding words can be transcribed inadequately or may not  be corrected upon review.

## 2017-08-19 ENCOUNTER — Encounter: Payer: Self-pay | Admitting: "Endocrinology

## 2017-08-19 ENCOUNTER — Ambulatory Visit (INDEPENDENT_AMBULATORY_CARE_PROVIDER_SITE_OTHER): Payer: PPO | Admitting: "Endocrinology

## 2017-08-19 VITALS — BP 119/80 | HR 87 | Ht 61.0 in | Wt 136.0 lb

## 2017-08-19 DIAGNOSIS — F17200 Nicotine dependence, unspecified, uncomplicated: Secondary | ICD-10-CM | POA: Diagnosis not present

## 2017-08-19 DIAGNOSIS — E1165 Type 2 diabetes mellitus with hyperglycemia: Secondary | ICD-10-CM | POA: Diagnosis not present

## 2017-08-19 DIAGNOSIS — E782 Mixed hyperlipidemia: Secondary | ICD-10-CM

## 2017-08-19 DIAGNOSIS — I10 Essential (primary) hypertension: Secondary | ICD-10-CM | POA: Diagnosis not present

## 2017-08-19 NOTE — Patient Instructions (Signed)

## 2017-08-19 NOTE — Progress Notes (Signed)
Consult Note       08/19/2017, 3:40 PM   Subjective:    Patient ID: Kristine Rodriguez, female    DOB: Dec 12, 1945.  she is being seen in consultation for management of currently uncontrolled symptomatic diabetes requested by  Sharilyn Sites, MD.   Past Medical History:  Diagnosis Date  . Cancer ([place])    hx of breast cancer left  . Diabetes mellitus   . Hypertension    Past Surgical History:  Procedure Laterality Date  . bladder tack    . HERNIA REPAIR    . left mastectomy    . TONSILLECTOMY    . VESICO-VAGINAL FISTULA REPAIR     Social History   Socioeconomic History  . Marital status: Divorced    Spouse name: None  . Number of children: None  . Years of education: None  . Highest education level: None  Social Needs  . Financial resource strain: None  . Food insecurity - worry: None  . Food insecurity - inability: None  . Transportation needs - medical: None  . Transportation needs - non-medical: None  Occupational History  . None  Tobacco Use  . Smoking status: Current Every Day Smoker    Packs/day: 0.50    Types: Cigarettes  . Smokeless tobacco: Never Used  Substance and Sexual Activity  . Alcohol use: Yes    Comment: socially drink  . Drug use: No  . Sexual activity: Yes  Other Topics Concern  . None  Social History Narrative  . None   Outpatient Encounter Medications as of 08/19/2017  Medication Sig  . albuterol (PROVENTIL) (2.5 MG/3ML) 0.083% nebulizer solution Take 2.5 mg by nebulization every 6 (six) hours as needed for wheezing or shortness of breath.  . Fexofenadine HCl (MUCINEX ALLERGY PO) Take by mouth.  . metFORMIN (GLUCOPHAGE) 1000 MG tablet Take 1 tablet (1,000 mg total) by mouth 2 (two) times daily with a meal.  . Omega-3 Fatty Acids (FISH OIL OMEGA-3 PO) Take by mouth daily.  Marland Kitchen omeprazole (PRILOSEC) 20 MG capsule Take 1 capsule (20 mg total) by mouth daily.  .  valsartan-hydrochlorothiazide (DIOVAN-HCT) 160-12.5 MG tablet Take 1 tablet by mouth daily.  . Vitamin D, Ergocalciferol, (DRISDOL) 50000 units CAPS capsule Take 50,000 Units by mouth every 7 (seven) days.   No facility-administered encounter medications on file as of 08/19/2017.     ALLERGIES: Allergies  Allergen Reactions  . Duricef [Cefadroxil] Shortness Of Breath and Swelling  . Tape Rash    VACCINATION STATUS:  There is no immunization history on file for this patient.  Diabetes  She presents for her follow-up diabetic visit. She has type 2 diabetes mellitus. Onset time: She was diagnosed at approximate age of 71 years. Her disease course has been improving. There are no hypoglycemic associated symptoms. Pertinent negatives for hypoglycemia include no confusion, headaches, pallor or seizures. Associated symptoms include fatigue. Pertinent negatives for diabetes include no chest pain, no polydipsia, no polyphagia and no polyuria. There are no hypoglycemic complications. Symptoms are improving. There are no diabetic complications. Risk factors for coronary artery disease include diabetes mellitus, dyslipidemia, family history, hypertension,  sedentary lifestyle, tobacco exposure and post-menopausal. Current diabetic treatment includes oral agent (dual therapy). Her weight is stable. She is following a generally unhealthy diet. When asked about meal planning, she reported none. She has not had a previous visit with a dietitian. She participates in exercise intermittently. Her breakfast blood glucose range is generally 140-180 mg/dl. Her lunch blood glucose range is generally 140-180 mg/dl. Her dinner blood glucose range is generally 140-180 mg/dl. Her bedtime blood glucose range is generally 140-180 mg/dl. Her overall blood glucose range is 140-180 mg/dl. ( Her most recent A1c was 8.3% from 07/08/2017.) An ACE inhibitor/angiotensin II receptor blocker is being taken. She does not see a  podiatrist.Eye exam is current.  Hyperlipidemia  This is a chronic problem. The current episode started more than 1 year ago. Pertinent negatives include no chest pain, myalgias or shortness of breath. Current antihyperlipidemic treatment includes fibric acid derivatives. Risk factors for coronary artery disease include diabetes mellitus, dyslipidemia, family history, hypertension and a sedentary lifestyle.  Hypertension  This is a chronic problem. The current episode started more than 1 year ago. The problem is controlled. Pertinent negatives include no chest pain, headaches, palpitations or shortness of breath. Risk factors for coronary artery disease include diabetes mellitus, dyslipidemia, smoking/tobacco exposure, sedentary lifestyle and family history. Past treatments include angiotensin blockers.      Review of Systems  Constitutional: Positive for fatigue. Negative for chills, fever and unexpected weight change.  HENT: Negative for trouble swallowing and voice change.   Eyes: Negative for visual disturbance.  Respiratory: Negative for cough, shortness of breath and wheezing.   Cardiovascular: Negative for chest pain, palpitations and leg swelling.  Gastrointestinal: Negative for diarrhea, nausea and vomiting.  Endocrine: Negative for cold intolerance, heat intolerance, polydipsia, polyphagia and polyuria.  Musculoskeletal: Negative for arthralgias and myalgias.  Skin: Negative for color change, pallor, rash and wound.  Neurological: Negative for seizures and headaches.  Psychiatric/Behavioral: Negative for confusion and suicidal ideas.    Objective:    BP 119/80   Pulse 87   Ht 5' 1"  (1.549 m)   Wt 136 lb (61.7 kg)   BMI 25.70 kg/m   Wt Readings from Last 3 Encounters:  08/19/17 136 lb (61.7 kg)  08/12/17 137 lb (62.1 kg)  06/04/12 165 lb (74.8 kg)     Physical Exam  Constitutional: She is oriented to person, place, and time. She appears well-developed.  HENT:  Head:  Normocephalic and atraumatic.  Eyes: EOM are normal.  Neck: Normal range of motion. Neck supple. No tracheal deviation present. No thyromegaly present.  Cardiovascular: Normal rate and regular rhythm.  Pulmonary/Chest: Effort normal and breath sounds normal.  Abdominal: Soft. Bowel sounds are normal. There is no tenderness. There is no guarding.  Musculoskeletal: Normal range of motion. She exhibits no edema.  Neurological: She is alert and oriented to person, place, and time. She has normal reflexes. No cranial nerve deficit. Coordination normal.  Skin: Skin is warm and dry. No rash noted. No erythema. No pallor.  Psychiatric: She has a normal mood and affect. Judgment normal.      CMP ( most recent) CMP     Component Value Date/Time   NA 141 09/16/2007 1025   K 4.4 09/16/2007 1025   CL 103 09/16/2007 1025   CO2 28 09/16/2007 1025   GLUCOSE 141 (H) 09/16/2007 1025   BUN 26 (A) 07/13/2017   CREATININE 0.8 07/13/2017   CREATININE 0.77 09/16/2007 1025   CALCIUM 9.3 09/16/2007 1025  PROT 6.8 09/16/2007 1025   ALBUMIN 3.6 09/16/2007 1025   AST 17 09/16/2007 1025   ALT 17 09/16/2007 1025   ALKPHOS 64 09/16/2007 1025   BILITOT 0.6 09/16/2007 1025   GFRNONAA >60 09/16/2007 1025   GFRAA  09/16/2007 1025    >60        The eGFR has been calculated using the MDRD equation. This calculation has not been validated in all clinical   Recent Results (from the past 2160 hour(s))  Hemoglobin A1c     Status: None   Collection Time: 07/08/17 12:00 AM  Result Value Ref Range   Hemoglobin A1C 8.3   Basic metabolic panel     Status: Abnormal   Collection Time: 07/13/17 12:00 AM  Result Value Ref Range   BUN 26 (A) 4 - 21   Creatinine 0.8 0.5 - 1.1  Lipid panel     Status: Abnormal   Collection Time: 07/13/17 12:00 AM  Result Value Ref Range   Triglycerides 383 (A) 40 - 160   Cholesterol 208 (A) 0 - 200   HDL 47 35 - 70   LDL Cholesterol 84       Assessment & Plan:   1.  Uncontrolled type 2 diabetes mellitus with hyperglycemia ([place])   - Patient has currently uncontrolled symptomatic type 2 DM since  71 years of age. - She came with significantly improved blood glucose profile close to target. -  her most recent A1c was 8.3%.    -her diabetes is complicated by chronic heavy smoking, sedentary life and ALISSAH REDMON remains at a high risk for more acute and chronic complications which include CAD, CVA, CKD, retinopathy, and neuropathy. These are all discussed in detail with the patient.  - I have counseled her on diet management by adopting a carbohydrate restricted/protein rich diet.  -  Suggestion is made for her to avoid simple carbohydrates  from her diet including Cakes, Sweet Desserts / Pastries, Ice Cream, Soda (diet and regular), Sweet Tea, Candies, Chips, Cookies, Store Bought Juices, Alcohol in Excess of  1-2 drinks a day, Artificial Sweeteners, and "Sugar-free" Products. This will help patient to have stable blood glucose profile and potentially avoid unintended weight gain.   - I encouraged her to switch to  unprocessed or minimally processed complex starch and increased protein intake (animal or plant source), fruits, and vegetables.  - she is advised to stick to a routine mealtimes to eat 3 meals  a day and avoid unnecessary snacks ( to snack only to correct hypoglycemia).   - she will be scheduled with Jearld Fenton, RDN, CDE for individualized diabetes education- consult pending.  - I have approached her with the following individualized plan to manage diabetes and patient agrees:   - Based on her resenting glycemic profile she will not need insulin treatment at this time.  - I will continue metformin 1000 g by mouth twice a day, therapeutically suitable for patient . - I will discontinue glipizide, risk outweighs benefit for this patient.  - she will be considered for incretin therapy as appropriate next visit. - Patient specific target   A1c;  LDL, HDL, Triglycerides, and  Waist Circumference were discussed in detail.  2) BP/HTN:  Controlled. She is advised to continue her current medications including  ARB. 3) Lipids/HPL:   Uncontrolled, patient is not on a statin. She is on omega-3 fatty acids given triglyceride level of 383. She'll be considered for low-dose statin  next visit. 4)  Weight/Diet: CDE Consult will be initiated , exercise, and detailed carbohydrates information provided.  5) Chronic Care/Health Maintenance:  -she  is on ARB and not on  Statin medications and  is encouraged to continue to follow up with Ophthalmology, Dentist,  Podiatrist at least yearly or according to recommendations, and advised to  quit smoking. I have recommended yearly flu vaccine and pneumonia vaccination at least every 5 years; moderate intensity exercise for up to 150 minutes weekly; and  sleep for at least 7 hours a day.  - I advised patient to maintain close follow up with Sharilyn Sites, MD for primary care needs. - Time spent with the patient: 25 min, of which >50% was spent in reviewing her sugar logs , discussing her hypo- and hyper-glycemic episodes, reviewing her current and  previous labs and insulin doses and developing a plan to avoid hypo- and hyper-glycemia.   Follow up plan: - Return in about 10 weeks (around 10/28/2017).  Glade Lloyd, MD [facility] [address] [place], [place] [address] Phone: [phone]  Fax: [phone]    08/19/2017, 3:40 PM  This note was partially dictated with voice recognition software. Similar sounding words can be transcribed inadequately or may not  be corrected upon review.

## 2017-09-23 ENCOUNTER — Ambulatory Visit: Payer: Self-pay | Admitting: Nutrition

## 2017-09-23 ENCOUNTER — Telehealth: Payer: Self-pay | Admitting: Nutrition

## 2017-09-23 NOTE — Telephone Encounter (Signed)
No VM to leave message to reschedule missed appt.

## 2017-10-20 ENCOUNTER — Other Ambulatory Visit: Payer: Self-pay | Admitting: "Endocrinology

## 2017-10-20 DIAGNOSIS — E1165 Type 2 diabetes mellitus with hyperglycemia: Secondary | ICD-10-CM | POA: Diagnosis not present

## 2017-10-21 LAB — COMPREHENSIVE METABOLIC PANEL
A/G RATIO: 1.6 (ref 1.2–2.2)
ALK PHOS: 71 IU/L (ref 39–117)
ALT: 9 IU/L (ref 0–32)
AST: 12 IU/L (ref 0–40)
Albumin: 4.2 g/dL (ref 3.5–4.8)
BILIRUBIN TOTAL: 0.2 mg/dL (ref 0.0–1.2)
BUN/Creatinine Ratio: 24 (ref 12–28)
BUN: 20 mg/dL (ref 8–27)
CHLORIDE: 100 mmol/L (ref 96–106)
CO2: 17 mmol/L — ABNORMAL LOW (ref 20–29)
Calcium: 10.3 mg/dL (ref 8.7–10.3)
Creatinine, Ser: 0.83 mg/dL (ref 0.57–1.00)
GFR calc Af Amer: 83 mL/min/{1.73_m2} (ref >59)
GFR calc non Af Amer: 72 mL/min/{1.73_m2} (ref >59)
GLOBULIN, TOTAL: 2.7 g/dL (ref 1.5–4.5)
Glucose: 191 mg/dL — ABNORMAL HIGH (ref 65–99)
POTASSIUM: 4.9 mmol/L (ref 3.5–5.2)
SODIUM: 137 mmol/L (ref 134–144)
Total Protein: 6.9 g/dL (ref 6.0–8.5)

## 2017-10-21 LAB — MICROALBUMIN, URINE: Microalbumin, Urine: 30.9 ug/mL

## 2017-10-21 LAB — VITAMIN D 25 HYDROXY (VIT D DEFICIENCY, FRACTURES): Vit D, 25-Hydroxy: 22.8 ng/mL — ABNORMAL LOW (ref 30.0–100.0)

## 2017-10-21 LAB — T4 AND TSH
T4 TOTAL: 6.8 ug/dL (ref 4.5–12.0)
TSH: 1.6 u[IU]/mL (ref 0.450–4.500)

## 2017-10-21 LAB — HGB A1C W/O EAG: HEMOGLOBIN A1C: 9.5 % — AB (ref 4.8–5.6)

## 2017-10-27 ENCOUNTER — Ambulatory Visit (INDEPENDENT_AMBULATORY_CARE_PROVIDER_SITE_OTHER): Payer: PPO | Admitting: "Endocrinology

## 2017-10-27 ENCOUNTER — Encounter: Payer: Self-pay | Admitting: "Endocrinology

## 2017-10-27 VITALS — BP 104/82 | HR 97 | Ht 61.0 in | Wt 137.0 lb

## 2017-10-27 DIAGNOSIS — I10 Essential (primary) hypertension: Secondary | ICD-10-CM | POA: Diagnosis not present

## 2017-10-27 DIAGNOSIS — F17200 Nicotine dependence, unspecified, uncomplicated: Secondary | ICD-10-CM

## 2017-10-27 DIAGNOSIS — E1165 Type 2 diabetes mellitus with hyperglycemia: Secondary | ICD-10-CM | POA: Diagnosis not present

## 2017-10-27 DIAGNOSIS — E782 Mixed hyperlipidemia: Secondary | ICD-10-CM | POA: Diagnosis not present

## 2017-10-27 MED ORDER — VALSARTAN-HYDROCHLOROTHIAZIDE 160-12.5 MG PO TABS: 1.0000 | ORAL_TABLET | Freq: Every day | ORAL | 3 refills | Status: DC

## 2017-10-27 NOTE — Progress Notes (Signed)
Consult Note       10/27/2017, 1:40 PM   Subjective:    Patient ID: Kristine Rodriguez, female    DOB: 06-16-1946.  she is being seen in consultation for management of currently uncontrolled symptomatic diabetes requested by  Sharilyn Sites, MD.   Past Medical History:  Diagnosis Date  . Cancer ([place])    hx of breast cancer left  . Diabetes mellitus   . Hypertension    Past Surgical History:  Procedure Laterality Date  . bladder tack    . COLONOSCOPY  06/04/2012   Procedure: COLONOSCOPY;  Surgeon: Jamesetta So, MD;  Location: [facility];  Service: Gastroenterology;  Laterality: N/A;  . ESOPHAGOGASTRODUODENOSCOPY  06/04/2012   Procedure: ESOPHAGOGASTRODUODENOSCOPY (EGD);  Surgeon: Jamesetta So, MD;  Location: [facility];  Service: Gastroenterology;  Laterality: N/A;  . HERNIA REPAIR    . left mastectomy    . TONSILLECTOMY    . VESICO-VAGINAL FISTULA REPAIR     Social History   Socioeconomic History  . Marital status: Divorced    Spouse name: None  . Number of children: None  . Years of education: None  . Highest education level: None  Social Needs  . Financial resource strain: None  . Food insecurity - worry: None  . Food insecurity - inability: None  . Transportation needs - medical: None  . Transportation needs - non-medical: None  Occupational History  . None  Tobacco Use  . Smoking status: Current Every Day Smoker    Packs/day: 0.50    Types: Cigarettes  . Smokeless tobacco: Never Used  Substance and Sexual Activity  . Alcohol use: Yes    Comment: socially drink  . Drug use: No  . Sexual activity: Yes  Other Topics Concern  . None  Social History Narrative  . None   Outpatient Encounter Medications as of 10/27/2017  Medication Sig  . albuterol (PROVENTIL) (2.5 MG/3ML) 0.083% nebulizer solution Take 2.5 mg by nebulization every 6 (six) hours as needed for wheezing or  shortness of breath.  . Fexofenadine HCl (MUCINEX ALLERGY PO) Take by mouth.  . metFORMIN (GLUCOPHAGE) 1000 MG tablet Take 1 tablet (1,000 mg total) by mouth 2 (two) times daily with a meal.  . Omega-3 Fatty Acids (FISH OIL OMEGA-3 PO) Take by mouth daily.  Marland Kitchen omeprazole (PRILOSEC) 20 MG capsule Take 1 capsule (20 mg total) by mouth daily.  . valsartan-hydrochlorothiazide (DIOVAN-HCT) 160-12.5 MG tablet Take 1 tablet by mouth daily.  . Vitamin D, Ergocalciferol, (DRISDOL) 50000 units CAPS capsule Take 50,000 Units by mouth every 7 (seven) days.  . [DISCONTINUED] valsartan-hydrochlorothiazide (DIOVAN-HCT) 160-12.5 MG tablet Take 1 tablet by mouth daily.   No facility-administered encounter medications on file as of 10/27/2017.     ALLERGIES: Allergies  Allergen Reactions  . Duricef [Cefadroxil] Shortness Of Breath and Swelling  . Tape Rash    VACCINATION STATUS:  There is no immunization history on file for this patient.  Diabetes  She presents for her follow-up diabetic visit. She has type 2 diabetes mellitus. Onset time: She was diagnosed at approximate age of 29 years. Her  disease course has been worsening. There are no hypoglycemic associated symptoms. Pertinent negatives for hypoglycemia include no confusion, headaches, pallor or seizures. Associated symptoms include fatigue, polydipsia and polyuria. Pertinent negatives for diabetes include no chest pain and no polyphagia. There are no hypoglycemic complications. Symptoms are worsening. There are no diabetic complications. Risk factors for coronary artery disease include diabetes mellitus, dyslipidemia, family history, hypertension, sedentary lifestyle, tobacco exposure and post-menopausal. Current diabetic treatment includes oral agent (dual therapy). Her weight is stable. She is following a generally unhealthy diet. When asked about meal planning, she reported none. She has not had a previous visit with a dietitian. She participates in  exercise intermittently. Her breakfast blood glucose range is generally 140-180 mg/dl. Her lunch blood glucose range is generally 140-180 mg/dl. Her dinner blood glucose range is generally 140-180 mg/dl. Her bedtime blood glucose range is generally 140-180 mg/dl. Her overall blood glucose range is 140-180 mg/dl. ( Her most recent A1c was 8.3% from 07/08/2017.) An ACE inhibitor/angiotensin II receptor blocker is being taken. She does not see a podiatrist.Eye exam is current.  Hyperlipidemia  This is a chronic problem. The current episode started more than 1 year ago. Pertinent negatives include no chest pain, myalgias or shortness of breath. Current antihyperlipidemic treatment includes fibric acid derivatives. Risk factors for coronary artery disease include diabetes mellitus, dyslipidemia, family history, hypertension and a sedentary lifestyle.  Hypertension  This is a chronic problem. The current episode started more than 1 year ago. The problem is controlled. Pertinent negatives include no chest pain, headaches, palpitations or shortness of breath. Risk factors for coronary artery disease include diabetes mellitus, dyslipidemia, smoking/tobacco exposure, sedentary lifestyle and family history. Past treatments include angiotensin blockers.      Review of Systems  Constitutional: Positive for fatigue. Negative for chills, fever and unexpected weight change.  HENT: Negative for trouble swallowing and voice change.   Eyes: Negative for visual disturbance.  Respiratory: Negative for cough, shortness of breath and wheezing.   Cardiovascular: Negative for chest pain, palpitations and leg swelling.  Gastrointestinal: Negative for diarrhea, nausea and vomiting.  Endocrine: Positive for polydipsia and polyuria. Negative for cold intolerance, heat intolerance and polyphagia.  Musculoskeletal: Negative for arthralgias and myalgias.  Skin: Negative for color change, pallor, rash and wound.  Neurological:  Negative for seizures and headaches.  Psychiatric/Behavioral: Negative for confusion and suicidal ideas.    Objective:    BP 104/82   Pulse 97   Ht 5\' 1"  (1.549 m)   Wt 137 lb (62.1 kg)   BMI 25.89 kg/m   Wt Readings from Last 3 Encounters:  10/27/17 137 lb (62.1 kg)  08/19/17 136 lb (61.7 kg)  08/12/17 137 lb (62.1 kg)     Physical Exam  Constitutional: She is oriented to person, place, and time. She appears well-developed.  HENT:  Head: Normocephalic and atraumatic.  Eyes: EOM are normal.  Neck: Normal range of motion. Neck supple. No tracheal deviation present. No thyromegaly present.  Cardiovascular: Normal rate and regular rhythm.  Pulmonary/Chest: Effort normal and breath sounds normal.  Abdominal: Soft. Bowel sounds are normal. There is no tenderness. There is no guarding.  Musculoskeletal: Normal range of motion. She exhibits no edema.  Neurological: She is alert and oriented to person, place, and time. She has normal reflexes. No cranial nerve deficit. Coordination normal.  Skin: Skin is warm and dry. No rash noted. No erythema. No pallor.  Psychiatric: She has a normal mood and affect. Judgment normal.  CMP ( most recent) CMP     Component Value Date/Time   NA 137 10/20/2017 1321   K 4.9 10/20/2017 1321   CL 100 10/20/2017 1321   CO2 17 (L) 10/20/2017 1321   GLUCOSE 191 (H) 10/20/2017 1321   GLUCOSE 141 (H) 09/16/2007 1025   BUN 20 10/20/2017 1321   CREATININE 0.83 10/20/2017 1321   CALCIUM 10.3 10/20/2017 1321   PROT 6.9 10/20/2017 1321   ALBUMIN 4.2 10/20/2017 1321   AST 12 10/20/2017 1321   ALT 9 10/20/2017 1321   ALKPHOS 71 10/20/2017 1321   BILITOT 0.2 10/20/2017 1321   GFRNONAA 72 10/20/2017 1321   GFRAA 83 10/20/2017 1321   Recent Results (from the past 2160 hour(s))  Comprehensive metabolic panel     Status: Abnormal   Collection Time: 10/20/17  1:21 PM  Result Value Ref Range   Glucose 191 (H) 65 - 99 mg/dL   BUN 20 8 - 27 mg/dL    Creatinine, Ser 0.83 0.57 - 1.00 mg/dL   GFR calc non Af Amer 72 >59 mL/min/1.73   GFR calc Af Amer 83 >59 mL/min/1.73   BUN/Creatinine Ratio 24 12 - 28   Sodium 137 134 - 144 mmol/L   Potassium 4.9 3.5 - 5.2 mmol/L   Chloride 100 96 - 106 mmol/L   CO2 17 (L) 20 - 29 mmol/L   Calcium 10.3 8.7 - 10.3 mg/dL   Total Protein 6.9 6.0 - 8.5 g/dL   Albumin 4.2 3.5 - 4.8 g/dL   Globulin, Total 2.7 1.5 - 4.5 g/dL   Albumin/Globulin Ratio 1.6 1.2 - 2.2   Bilirubin Total 0.2 0.0 - 1.2 mg/dL   Alkaline Phosphatase 71 39 - 117 IU/L   AST 12 0 - 40 IU/L   ALT 9 0 - 32 IU/L  T4 AND TSH     Status: None   Collection Time: 10/20/17  1:21 PM  Result Value Ref Range   TSH 1.600 0.450 - 4.500 uIU/mL   T4, Total 6.8 4.5 - 12.0 ug/dL  Hgb A1c w/o eAG     Status: Abnormal   Collection Time: 10/20/17  1:21 PM  Result Value Ref Range   Hgb A1c MFr Bld 9.5 (H) 4.8 - 5.6 %    Comment:          Prediabetes: 5.7 - 6.4          Diabetes: >6.4          Glycemic control for adults with diabetes: <7.0   VITAMIN D 25 Hydroxy (Vit-D Deficiency, Fractures)     Status: Abnormal   Collection Time: 10/20/17  1:21 PM  Result Value Ref Range   Vit D, 25-Hydroxy 22.8 (L) 30.0 - 100.0 ng/mL    Comment: Vitamin D deficiency has been defined by the Institute of Medicine and an Endocrine Society practice guideline as a level of serum 25-OH vitamin D less than 20 ng/mL (1,2). The Endocrine Society went on to further define vitamin D insufficiency as a level between 21 and 29 ng/mL (2). 1. IOM (Institute of Medicine). 2010. Dietary reference    intakes for calcium and D. [place]: The    [employer]. 2. Holick MF, Binkley Milburn, Bischoff-Ferrari HA, et Kristine.    Evaluation, treatment, and prevention of vitamin D    deficiency: an Endocrine Society clinical practice    guideline. JCEM. 2011 Jul; 96(7):1911-30.   Microalbumin, urine     Status: None   Collection Time:  10/20/17  1:21 PM  Result Value Ref  Range   Albumin, Urine 30.9 Not Estab. ug/mL      Assessment & Plan:   1. Uncontrolled type 2 diabetes mellitus with hyperglycemia ([place])  - Patient has currently uncontrolled symptomatic type 2 DM since  72 years of age. - She came with significantly  elevated A1c of 9.5% from 8.3%.  She admits to significant dietary indiscretion over the holiday season.   -her diabetes is complicated by chronic heavy smoking, sedentary life and Kristine Rodriguez remains at a high risk for more acute and chronic complications which include CAD, CVA, CKD, retinopathy, and neuropathy. These are all discussed in detail with the patient.  - I have counseled her on diet management by adopting a carbohydrate restricted/protein rich diet.  -  Suggestion is made for her to avoid simple carbohydrates  from her diet including Cakes, Sweet Desserts / Pastries, Ice Cream, Soda (diet and regular), Sweet Tea, Candies, Chips, Cookies, Store Bought Juices, Alcohol in Excess of  1-2 drinks a day, Artificial Sweeteners, and "Sugar-free" Products. This will help patient to have stable blood glucose profile and potentially avoid unintended weight gain.   - I encouraged her to switch to  unprocessed or minimally processed complex starch and increased protein intake (animal or plant source), fruits, and vegetables.  - she is advised to stick to a routine mealtimes to eat 3 meals  a day and avoid unnecessary snacks ( to snack only to correct hypoglycemia).   - she will be scheduled with Jearld Fenton, RDN, CDE for individualized diabetes education- consult pending.  - I have approached her with the following individualized plan to manage diabetes and patient agrees:   - Based on her presenting high A1c of 9.5%, she was approached for initiation of basal insulin. However she declined this offer for now. She promises to do better on her diet.  - I will continue metformin 1000 mg by mouth twice a day, therapeutically suitable for  patient .   - she will be considered for incretin therapy as appropriate next visit. - Patient specific target  A1c;  LDL, HDL, Triglycerides, and  Waist Circumference were discussed in detail.  2) BP/HTN:  Her blood pressure is controlled to target.  She is advised to continue her current medications including  ARB. 3) Lipids/HPL:   Uncontrolled, patient is not on a statin. She is on omega-3 fatty acids given triglyceride level of 383. She'll be considered for low-dose statin  next visit. 4)  Weight/Diet: CDE Consult will be initiated , exercise, and detailed carbohydrates information provided.  5) Chronic Care/Health Maintenance:  -she  is on ARB and not on  Statin medications and  is encouraged to continue to follow up with Ophthalmology, Dentist,  Podiatrist at least yearly or according to recommendations, and advised to  quit smoking. I have recommended yearly flu vaccine and pneumonia vaccination at least every 5 years; moderate intensity exercise for up to 150 minutes weekly; and  sleep for at least 7 hours a day.  - I advised patient to maintain close follow up with Sharilyn Sites, MD for primary care needs.    Follow up plan: - Return in about 2 weeks (around 11/10/2017) for follow up with meter and logs- no labs.  Glade Lloyd, MD [facility] [address] [place], [place] [address] Phone: [phone]  Fax: [phone]    10/27/2017, 1:40 PM  This note was partially dictated  with voice recognition software. Similar sounding words can be transcribed inadequately or may not  be corrected upon review.

## 2017-10-27 NOTE — Patient Instructions (Signed)

## 2017-11-05 ENCOUNTER — Other Ambulatory Visit: Payer: Self-pay | Admitting: "Endocrinology

## 2017-11-10 ENCOUNTER — Encounter: Payer: Self-pay | Admitting: "Endocrinology

## 2017-11-10 ENCOUNTER — Ambulatory Visit (INDEPENDENT_AMBULATORY_CARE_PROVIDER_SITE_OTHER): Payer: PPO | Admitting: "Endocrinology

## 2017-11-10 VITALS — BP 138/85 | HR 93 | Ht 61.0 in | Wt 133.0 lb

## 2017-11-10 DIAGNOSIS — E782 Mixed hyperlipidemia: Secondary | ICD-10-CM

## 2017-11-10 DIAGNOSIS — E1165 Type 2 diabetes mellitus with hyperglycemia: Secondary | ICD-10-CM | POA: Diagnosis not present

## 2017-11-10 DIAGNOSIS — F17200 Nicotine dependence, unspecified, uncomplicated: Secondary | ICD-10-CM | POA: Diagnosis not present

## 2017-11-10 DIAGNOSIS — I10 Essential (primary) hypertension: Secondary | ICD-10-CM

## 2017-11-10 MED ORDER — INSULIN PEN NEEDLE 31G X 8 MM MISC: 1.0000 | 3 refills | Status: DC

## 2017-11-10 MED ORDER — INSULIN GLARGINE 300 UNIT/ML ~~LOC~~ SOPN: 20.0000 [IU] | PEN_INJECTOR | Freq: Every day | SUBCUTANEOUS | 2 refills | Status: DC

## 2017-11-10 NOTE — Progress Notes (Signed)
Consult Note       11/10/2017, 1:36 PM   Subjective:    Patient ID: Kristine Rodriguez, female    DOB: Jun 20, 1946.  she is being seen in consultation for management of currently uncontrolled symptomatic diabetes requested by  Sharilyn Sites, MD.   Past Medical History:  Diagnosis Date  . Cancer ([place])    hx of breast cancer left  . Diabetes mellitus   . Hypertension    Past Surgical History:  Procedure Laterality Date  . bladder tack    . COLONOSCOPY  06/04/2012   Procedure: COLONOSCOPY;  Surgeon: Jamesetta So, MD;  Location: [facility];  Service: Gastroenterology;  Laterality: N/A;  . ESOPHAGOGASTRODUODENOSCOPY  06/04/2012   Procedure: ESOPHAGOGASTRODUODENOSCOPY (EGD);  Surgeon: Jamesetta So, MD;  Location: [facility];  Service: Gastroenterology;  Laterality: N/A;  . HERNIA REPAIR    . left mastectomy    . TONSILLECTOMY    . VESICO-VAGINAL FISTULA REPAIR     Social History   Socioeconomic History  . Marital status: Divorced    Spouse name: None  . Number of children: None  . Years of education: None  . Highest education level: None  Social Needs  . Financial resource strain: None  . Food insecurity - worry: None  . Food insecurity - inability: None  . Transportation needs - medical: None  . Transportation needs - non-medical: None  Occupational History  . None  Tobacco Use  . Smoking status: Current Every Day Smoker    Packs/day: 0.50    Types: Cigarettes  . Smokeless tobacco: Never Used  Substance and Sexual Activity  . Alcohol use: Yes    Comment: socially drink  . Drug use: No  . Sexual activity: Yes  Other Topics Concern  . None  Social History Narrative  . None   Outpatient Encounter Medications as of 11/10/2017  Medication Sig  . albuterol (PROVENTIL) (2.5 MG/3ML) 0.083% nebulizer solution Take 2.5 mg by nebulization every 6 (six) hours as needed for wheezing or  shortness of breath.  . Fexofenadine HCl (MUCINEX ALLERGY PO) Take by mouth.  . Insulin Glargine (TOUJEO SOLOSTAR) 300 UNIT/ML SOPN Inject 20 Units into the skin at bedtime.  . Insulin Pen Needle (B-D ULTRAFINE III SHORT PEN) 31G X 8 MM MISC 1 each by Does not apply route as directed.  . metFORMIN (GLUCOPHAGE) 1000 MG tablet take 1 tablet twice a day with food  . Omega-3 Fatty Acids (FISH OIL OMEGA-3 PO) Take by mouth daily.  Marland Kitchen omeprazole (PRILOSEC) 20 MG capsule Take 1 capsule (20 mg total) by mouth daily.  . valsartan-hydrochlorothiazide (DIOVAN-HCT) 160-12.5 MG tablet Take 1 tablet by mouth daily.  . Vitamin D, Ergocalciferol, (DRISDOL) 50000 units CAPS capsule Take 50,000 Units by mouth every 7 (seven) days.   No facility-administered encounter medications on file as of 11/10/2017.     ALLERGIES: Allergies  Allergen Reactions  . Duricef [Cefadroxil] Shortness Of Breath and Swelling  . Tape Rash    VACCINATION STATUS:  There is no immunization history on file for this patient.  Diabetes  She presents for her follow-up  diabetic visit. She has type 2 diabetes mellitus. Onset time: She was diagnosed at approximate age of 9 years. Her disease course has been worsening. There are no hypoglycemic associated symptoms. Pertinent negatives for hypoglycemia include no confusion, headaches, pallor or seizures. Associated symptoms include fatigue, polydipsia and polyuria. Pertinent negatives for diabetes include no chest pain and no polyphagia. There are no hypoglycemic complications. Symptoms are worsening. There are no diabetic complications. Risk factors for coronary artery disease include diabetes mellitus, dyslipidemia, family history, hypertension, sedentary lifestyle, tobacco exposure and post-menopausal. Current diabetic treatment includes oral agent (dual therapy). Her weight is decreasing steadily. She is following a generally unhealthy diet. When asked about meal planning, she reported  none. She has not had a previous visit with a dietitian. She participates in exercise intermittently. Her breakfast blood glucose range is generally 180-200 mg/dl. Her lunch blood glucose range is generally 180-200 mg/dl. Her dinner blood glucose range is generally 180-200 mg/dl. Her bedtime blood glucose range is generally 180-200 mg/dl. Her overall blood glucose range is 180-200 mg/dl. An ACE inhibitor/angiotensin II receptor blocker is being taken. She does not see a podiatrist.Eye exam is current.  Hyperlipidemia  This is a chronic problem. The current episode started more than 1 year ago. Pertinent negatives include no chest pain, myalgias or shortness of breath. Current antihyperlipidemic treatment includes fibric acid derivatives. Risk factors for coronary artery disease include diabetes mellitus, dyslipidemia, family history, hypertension and a sedentary lifestyle.  Hypertension  This is a chronic problem. The current episode started more than 1 year ago. The problem is controlled. Pertinent negatives include no chest pain, headaches, palpitations or shortness of breath. Risk factors for coronary artery disease include diabetes mellitus, dyslipidemia, smoking/tobacco exposure, sedentary lifestyle and family history. Past treatments include angiotensin blockers.      Review of Systems  Constitutional: Positive for fatigue. Negative for chills, fever and unexpected weight change.  HENT: Negative for trouble swallowing and voice change.   Eyes: Negative for visual disturbance.  Respiratory: Negative for cough, shortness of breath and wheezing.   Cardiovascular: Negative for chest pain, palpitations and leg swelling.  Gastrointestinal: Negative for diarrhea, nausea and vomiting.  Endocrine: Positive for polydipsia and polyuria. Negative for cold intolerance, heat intolerance and polyphagia.  Musculoskeletal: Negative for arthralgias and myalgias.  Skin: Negative for color change, pallor, rash  and wound.  Neurological: Negative for seizures and headaches.  Psychiatric/Behavioral: Negative for confusion and suicidal ideas.    Objective:    BP 138/85   Pulse 93   Ht 5\' 1"  (1.549 m)   Wt 133 lb (60.3 kg)   BMI 25.13 kg/m   Wt Readings from Last 3 Encounters:  11/10/17 133 lb (60.3 kg)  10/27/17 137 lb (62.1 kg)  08/19/17 136 lb (61.7 kg)     Physical Exam  Constitutional: She is oriented to person, place, and time. She appears well-developed.  HENT:  Head: Normocephalic and atraumatic.  Eyes: EOM are normal.  Neck: Normal range of motion. Neck supple. No tracheal deviation present. No thyromegaly present.  Cardiovascular: Normal rate and regular rhythm.  Pulmonary/Chest: Effort normal and breath sounds normal.  Abdominal: Soft. Bowel sounds are normal. There is no tenderness. There is no guarding.  Musculoskeletal: Normal range of motion. She exhibits no edema.  Neurological: She is alert and oriented to person, place, and time. She has normal reflexes. No cranial nerve deficit. Coordination normal.  Skin: Skin is warm and dry. No rash noted. No erythema. No pallor.  Psychiatric: She has a normal mood and affect. Judgment normal.    CMP ( most recent) CMP     Component Value Date/Time   NA 137 10/20/2017 1321   K 4.9 10/20/2017 1321   CL 100 10/20/2017 1321   CO2 17 (L) 10/20/2017 1321   GLUCOSE 191 (H) 10/20/2017 1321   GLUCOSE 141 (H) 09/16/2007 1025   BUN 20 10/20/2017 1321   CREATININE 0.83 10/20/2017 1321   CALCIUM 10.3 10/20/2017 1321   PROT 6.9 10/20/2017 1321   ALBUMIN 4.2 10/20/2017 1321   AST 12 10/20/2017 1321   ALT 9 10/20/2017 1321   ALKPHOS 71 10/20/2017 1321   BILITOT 0.2 10/20/2017 1321   GFRNONAA 72 10/20/2017 1321   GFRAA 83 10/20/2017 1321   Recent Results (from the past 2160 hour(s))  Comprehensive metabolic panel     Status: Abnormal   Collection Time: 10/20/17  1:21 PM  Result Value Ref Range   Glucose 191 (H) 65 - 99 mg/dL    BUN 20 8 - 27 mg/dL   Creatinine, Ser 0.83 0.57 - 1.00 mg/dL   GFR calc non Af Amer 72 >59 mL/min/1.73   GFR calc Af Amer 83 >59 mL/min/1.73   BUN/Creatinine Ratio 24 12 - 28   Sodium 137 134 - 144 mmol/L   Potassium 4.9 3.5 - 5.2 mmol/L   Chloride 100 96 - 106 mmol/L   CO2 17 (L) 20 - 29 mmol/L   Calcium 10.3 8.7 - 10.3 mg/dL   Total Protein 6.9 6.0 - 8.5 g/dL   Albumin 4.2 3.5 - 4.8 g/dL   Globulin, Total 2.7 1.5 - 4.5 g/dL   Albumin/Globulin Ratio 1.6 1.2 - 2.2   Bilirubin Total 0.2 0.0 - 1.2 mg/dL   Alkaline Phosphatase 71 39 - 117 IU/L   AST 12 0 - 40 IU/L   ALT 9 0 - 32 IU/L  T4 AND TSH     Status: None   Collection Time: 10/20/17  1:21 PM  Result Value Ref Range   TSH 1.600 0.450 - 4.500 uIU/mL   T4, Total 6.8 4.5 - 12.0 ug/dL  Hgb A1c w/o eAG     Status: Abnormal   Collection Time: 10/20/17  1:21 PM  Result Value Ref Range   Hgb A1c MFr Bld 9.5 (H) 4.8 - 5.6 %    Comment:          Prediabetes: 5.7 - 6.4          Diabetes: >6.4          Glycemic control for adults with diabetes: <7.0   VITAMIN D 25 Hydroxy (Vit-D Deficiency, Fractures)     Status: Abnormal   Collection Time: 10/20/17  1:21 PM  Result Value Ref Range   Vit D, 25-Hydroxy 22.8 (L) 30.0 - 100.0 ng/mL    Comment: Vitamin D deficiency has been defined by the Institute of Medicine and an Endocrine Society practice guideline as a level of serum 25-OH vitamin D less than 20 ng/mL (1,2). The Endocrine Society went on to further define vitamin D insufficiency as a level between 21 and 29 ng/mL (2). 1. IOM (Institute of Medicine). 2010. Dietary reference    intakes for calcium and D. [place]: The    [employer]. 2. Holick MF, Binkley Stickney, Bischoff-Ferrari HA, et al.    Evaluation, treatment, and prevention of vitamin D    deficiency: an Endocrine Society clinical practice    guideline. JCEM. 2011 Jul; 96(7):1911-30.  Microalbumin, urine     Status: None   Collection Time: 10/20/17  1:21  PM  Result Value Ref Range   Microalbumin, Urine 30.9 Not Estab. ug/mL      Assessment & Plan:   1. Uncontrolled type 2 diabetes mellitus with hyperglycemia ([place])  - Patient has currently uncontrolled symptomatic type 2 DM since  72 years of age. - She came with significantly  Above target glucose readings , both fasting and post prandial, her recent  A1c of 9.5% from 8.3%.  She admits to significant dietary indiscretion over the holiday season.   -her diabetes is complicated by chronic heavy smoking, sedentary life and Kristine Rodriguez remains at a high risk for more acute and chronic complications which include CAD, CVA, CKD, retinopathy, and neuropathy. These are all discussed in detail with the patient.  - I have counseled her on diet management by adopting a carbohydrate restricted/protein rich diet.  -  Suggestion is made for her to avoid simple carbohydrates  from her diet including Cakes, Sweet Desserts / Pastries, Ice Cream, Soda (diet and regular), Sweet Tea, Candies, Chips, Cookies, Store Bought Juices, Alcohol in Excess of  1-2 drinks a day, Artificial Sweeteners, and "Sugar-free" Products. This will help patient to have stable blood glucose profile and potentially avoid unintended weight gain.  - I encouraged her to switch to  unprocessed or minimally processed complex starch and increased protein intake (animal or plant source), fruits, and vegetables.  - she is advised to stick to a routine mealtimes to eat 3 meals  a day and avoid unnecessary snacks ( to snack only to correct hypoglycemia).   - she will be scheduled with Jearld Fenton, RDN, CDE for individualized diabetes education- consult pending.  - I have approached her with the following individualized plan to manage diabetes and patient agrees:   - Based on her presenting glycemia burden and high A1c of 9.5%, she is  approached for initiation of basal insulin, and accepts this time. - I discussed and initiated basal  insulin Toujeo 20 units qhs, associated with strict monitoring of glucose ac and hs. - I will continue metformin 1000 mg by mouth twice a day, therapeutically suitable for patient .  - Patient specific target  A1c;  LDL, HDL, Triglycerides, and  Waist Circumference were discussed in detail.  2) BP/HTN:  Her blood pressure is controlled to target.  She is advised to continue her current medications including  ARB. 3) Lipids/HPL:   Uncontrolled, patient is not on a statin. She is on omega-3 fatty acids given triglyceride level of 383. She'll be considered for low-dose statin  next visit. 4)  Weight/Diet: CDE Consult will be initiated , exercise, and detailed carbohydrates information provided.  5) Chronic Care/Health Maintenance:  -she  is on ARB and not on  Statin medications and  is encouraged to continue to follow up with Ophthalmology, Dentist,  Podiatrist at least yearly or according to recommendations, and advised to  quit smoking. I have recommended yearly flu vaccine and pneumonia vaccination at least every 5 years; moderate intensity exercise for up to 150 minutes weekly; and  sleep for at least 7 hours a day.  - Time spent with the patient: 25 min, of which >50% was spent in reviewing her blood glucose logs , discussing her hypo- and hyper-glycemic episodes, reviewing her current and  previous labs and insulin doses and developing a plan to avoid hypo- and hyper-glycemia. Please refer to Patient Instructions for Blood Glucose  Monitoring and Insulin/Medications Dosing Guide"  in media tab for additional information.  - I advised patient to maintain close follow up with Sharilyn Sites, MD for primary care needs.    Follow up plan: - Return in about 2 weeks (around 11/24/2017) for follow up with meter and logs- no labs.  Glade Lloyd, MD [facility] [address] [place], [place] [address] Phone: [phone]  Fax: [phone]     11/10/2017, 1:36 PM  This note was partially dictated with voice recognition software. Similar sounding words can be transcribed inadequately or may not  be corrected upon review.

## 2017-11-10 NOTE — Patient Instructions (Signed)

## 2017-11-11 ENCOUNTER — Other Ambulatory Visit: Payer: Self-pay

## 2017-11-11 MED ORDER — INSULIN PEN NEEDLE 31G X 8 MM MISC: 1.0000 | Freq: Every day | 3 refills | Status: DC

## 2017-11-24 ENCOUNTER — Encounter: Payer: Self-pay | Admitting: "Endocrinology

## 2017-11-24 ENCOUNTER — Ambulatory Visit (INDEPENDENT_AMBULATORY_CARE_PROVIDER_SITE_OTHER): Payer: PPO | Admitting: "Endocrinology

## 2017-11-24 VITALS — BP 133/77 | HR 90 | Ht 61.0 in | Wt 133.0 lb

## 2017-11-24 DIAGNOSIS — E1165 Type 2 diabetes mellitus with hyperglycemia: Secondary | ICD-10-CM

## 2017-11-24 DIAGNOSIS — F17200 Nicotine dependence, unspecified, uncomplicated: Secondary | ICD-10-CM | POA: Diagnosis not present

## 2017-11-24 DIAGNOSIS — I10 Essential (primary) hypertension: Secondary | ICD-10-CM

## 2017-11-24 DIAGNOSIS — E782 Mixed hyperlipidemia: Secondary | ICD-10-CM

## 2017-11-24 NOTE — Progress Notes (Signed)
Consult Note       11/24/2017, 12:41 PM   Subjective:    Patient ID: Kristine Rodriguez, female    DOB: Mar 02, 1946.  she is being seen in consultation for management of currently uncontrolled symptomatic diabetes requested by  Sharilyn Sites, MD.   Past Medical History:  Diagnosis Date  . Cancer ([place])    hx of breast cancer left  . Diabetes mellitus   . Hypertension    Past Surgical History:  Procedure Laterality Date  . bladder tack    . COLONOSCOPY  06/04/2012   Procedure: COLONOSCOPY;  Surgeon: Jamesetta So, MD;  Location: [facility];  Service: Gastroenterology;  Laterality: N/A;  . ESOPHAGOGASTRODUODENOSCOPY  06/04/2012   Procedure: ESOPHAGOGASTRODUODENOSCOPY (EGD);  Surgeon: Jamesetta So, MD;  Location: [facility];  Service: Gastroenterology;  Laterality: N/A;  . HERNIA REPAIR    . left mastectomy    . TONSILLECTOMY    . VESICO-VAGINAL FISTULA REPAIR     Social History   Socioeconomic History  . Marital status: Divorced    Spouse name: None  . Number of children: None  . Years of education: None  . Highest education level: None  Social Needs  . Financial resource strain: None  . Food insecurity - worry: None  . Food insecurity - inability: None  . Transportation needs - medical: None  . Transportation needs - non-medical: None  Occupational History  . None  Tobacco Use  . Smoking status: Current Every Day Smoker    Packs/day: 0.50    Types: Cigarettes  . Smokeless tobacco: Never Used  Substance and Sexual Activity  . Alcohol use: Yes    Comment: socially drink  . Drug use: No  . Sexual activity: Yes  Other Topics Concern  . None  Social History Narrative  . None   Outpatient Encounter Medications as of 11/24/2017  Medication Sig  . albuterol (PROVENTIL) (2.5 MG/3ML) 0.083% nebulizer solution Take 2.5 mg by nebulization every 6 (six) hours as needed for wheezing or  shortness of breath.  . Fexofenadine HCl (MUCINEX ALLERGY PO) Take by mouth.  . Insulin Glargine (TOUJEO SOLOSTAR) 300 UNIT/ML SOPN Inject 20 Units into the skin at bedtime.  . Insulin Pen Needle (B-D ULTRAFINE III SHORT PEN) 31G X 8 MM MISC 1 each by Does not apply route at bedtime.  . metFORMIN (GLUCOPHAGE) 1000 MG tablet take 1 tablet twice a day with food  . Omega-3 Fatty Acids (FISH OIL OMEGA-3 PO) Take by mouth daily.  Mar[place] Kitchen omeprazole (PRILOSEC) 20 MG capsule Take 1 capsule (20 mg total) by mouth daily.  . valsartan-hydrochlorothiazide (DIOVAN-HCT) 160-12.5 MG tablet Take 1 tablet by mouth daily.  . Vitamin D, Ergocalciferol, (DRISDOL) 50000 units CAPS capsule Take 50,000 Units by mouth every 7 (seven) days.   No facility-administered encounter medications on file as of 11/24/2017.     ALLERGIES: Allergies  Allergen Reactions  . Duricef [Cefadroxil] Shortness Of Breath and Swelling  . Tape Rash    VACCINATION STATUS:  There is no immunization history on file for this patient.  Diabetes  She presents for her follow-up  diabetic visit. She has type 2 diabetes mellitus. Onset time: She was diagnosed at approximate age of 67 years. Her disease course has been improving. There are no hypoglycemic associated symptoms. Pertinent negatives for hypoglycemia include no confusion, headaches, pallor or seizures. Associated symptoms include fatigue. Pertinent negatives for diabetes include no chest pain, no polydipsia, no polyphagia and no polyuria. There are no hypoglycemic complications. Symptoms are improving. There are no diabetic complications. Risk factors for coronary artery disease include diabetes mellitus, dyslipidemia, family history, hypertension, sedentary lifestyle, tobacco exposure and post-menopausal. Current diabetic treatment includes oral agent (dual therapy). Her weight is stable. She is following a generally unhealthy diet. When asked about meal planning, she reported none. She has  not had a previous visit with a dietitian. She participates in exercise intermittently. Her breakfast blood glucose range is generally 140-180 mg/dl. Her lunch blood glucose range is generally 140-180 mg/dl. Her dinner blood glucose range is generally 140-180 mg/dl. Her bedtime blood glucose range is generally 180-200 mg/dl. Her overall blood glucose range is 140-180 mg/dl. An ACE inhibitor/angiotensin II receptor blocker is being taken. She does not see a podiatrist.Eye exam is current.  Hyperlipidemia  This is a chronic problem. The current episode started more than 1 year ago. Exacerbating diseases include diabetes. She has no history of obesity. Pertinent negatives include no chest pain, myalgias or shortness of breath. Current antihyperlipidemic treatment includes fibric acid derivatives. Risk factors for coronary artery disease include diabetes mellitus, dyslipidemia, family history, hypertension and a sedentary lifestyle.  Hypertension  This is a chronic problem. The current episode started more than 1 year ago. The problem is controlled. Pertinent negatives include no chest pain, headaches, palpitations or shortness of breath. Risk factors for coronary artery disease include diabetes mellitus, dyslipidemia, smoking/tobacco exposure, sedentary lifestyle and family history. Past treatments include angiotensin blockers.    Review of Systems  Constitutional: Positive for fatigue. Negative for chills, fever and unexpected weight change.  HENT: Negative for trouble swallowing and voice change.   Eyes: Negative for visual disturbance.  Respiratory: Negative for cough, shortness of breath and wheezing.   Cardiovascular: Negative for chest pain, palpitations and leg swelling.  Gastrointestinal: Negative for diarrhea, nausea and vomiting.  Endocrine: Negative for cold intolerance, heat intolerance, polydipsia, polyphagia and polyuria.  Musculoskeletal: Negative for arthralgias and myalgias.  Skin:  Negative for color change, pallor, rash and wound.  Neurological: Negative for seizures and headaches.  Psychiatric/Behavioral: Negative for confusion and suicidal ideas.    Objective:    BP 133/77   Pulse 90   Ht 5\' 1"  (1.549 m)   Wt 133 lb (60.3 kg)   BMI 25.13 kg/m   Wt Readings from Last 3 Encounters:  11/24/17 133 lb (60.3 kg)  11/10/17 133 lb (60.3 kg)  10/27/17 137 lb (62.1 kg)     Physical Exam  Constitutional: She is oriented to person, place, and time. She appears well-developed.  HENT:  Head: Normocephalic and atraumatic.  Eyes: EOM are normal.  Neck: Normal range of motion. Neck supple. No tracheal deviation present. No thyromegaly present.  Cardiovascular: Normal rate and regular rhythm.  Pulmonary/Chest: Effort normal and breath sounds normal.  Abdominal: Soft. Bowel sounds are normal. There is no tenderness. There is no guarding.  Musculoskeletal: Normal range of motion. She exhibits no edema.  Neurological: She is alert and oriented to person, place, and time. She has normal reflexes. No cranial nerve deficit. Coordination normal.  Skin: Skin is warm and dry. No rash noted.  No erythema. No pallor.  Psychiatric: She has a normal mood and affect. Judgment normal.    CMP ( most recent) CMP     Component Value Date/Time   NA 137 10/20/2017 1321   K 4.9 10/20/2017 1321   CL 100 10/20/2017 1321   CO2 17 (L) 10/20/2017 1321   GLUCOSE 191 (H) 10/20/2017 1321   GLUCOSE 141 (H) 09/16/2007 1025   BUN 20 10/20/2017 1321   CREATININE 0.83 10/20/2017 1321   CALCIUM 10.3 10/20/2017 1321   PROT 6.9 10/20/2017 1321   ALBUMIN 4.2 10/20/2017 1321   AST 12 10/20/2017 1321   ALT 9 10/20/2017 1321   ALKPHOS 71 10/20/2017 1321   BILITOT 0.2 10/20/2017 1321   GFRNONAA 72 10/20/2017 1321   GFRAA 83 10/20/2017 1321   Recent Results (from the past 2160 hour(s))  Comprehensive metabolic panel     Status: Abnormal   Collection Time: 10/20/17  1:21 PM  Result Value Ref  Range   Glucose 191 (H) 65 - 99 mg/dL   BUN 20 8 - 27 mg/dL   Creatinine, Ser 0.83 0.57 - 1.00 mg/dL   GFR calc non Af Amer 72 >59 mL/min/1.73   GFR calc Af Amer 83 >59 mL/min/1.73   BUN/Creatinine Ratio 24 12 - 28   Sodium 137 134 - 144 mmol/L   Potassium 4.9 3.5 - 5.2 mmol/L   Chloride 100 96 - 106 mmol/L   CO2 17 (L) 20 - 29 mmol/L   Calcium 10.3 8.7 - 10.3 mg/dL   Total Protein 6.9 6.0 - 8.5 g/dL   Albumin 4.2 3.5 - 4.8 g/dL   Globulin, Total 2.7 1.5 - 4.5 g/dL   Albumin/Globulin Ratio 1.6 1.2 - 2.2   Bilirubin Total 0.2 0.0 - 1.2 mg/dL   Alkaline Phosphatase 71 39 - 117 IU/L   AST 12 0 - 40 IU/L   ALT 9 0 - 32 IU/L  T4 AND TSH     Status: None   Collection Time: 10/20/17  1:21 PM  Result Value Ref Range   TSH 1.600 0.450 - 4.500 uIU/mL   T4, Total 6.8 4.5 - 12.0 ug/dL  Hgb A1c w/o eAG     Status: Abnormal   Collection Time: 10/20/17  1:21 PM  Result Value Ref Range   Hgb A1c MFr Bld 9.5 (H) 4.8 - 5.6 %    Comment:          Prediabetes: 5.7 - 6.4          Diabetes: >6.4          Glycemic control for adults with diabetes: <7.0   VITAMIN D 25 Hydroxy (Vit-D Deficiency, Fractures)     Status: Abnormal   Collection Time: 10/20/17  1:21 PM  Result Value Ref Range   Vit D, 25-Hydroxy 22.8 (L) 30.0 - 100.0 ng/mL    Comment: Vitamin D deficiency has been defined by the Institute of Medicine and an Endocrine Society practice guideline as a level of serum 25-OH vitamin D less than 20 ng/mL (1,2). The Endocrine Society went on to further define vitamin D insufficiency as a level between 21 and 29 ng/mL (2). 1. IOM (Institute of Medicine). 2010. Dietary reference    intakes for calcium and D. [place]: The    [employer]. 2. Holick MF, Binkley Vidalia, Bischoff-Ferrari HA, et al.    Evaluation, treatment, and prevention of vitamin D    deficiency: an Endocrine Society clinical practice    guideline.  JCEM. 2011 Jul; 96(7):1911-30.   Microalbumin, urine      Status: None   Collection Time: 10/20/17  1:21 PM  Result Value Ref Range   Microalbumin, Urine 30.9 Not Estab. ug/mL     Assessment & Plan:   1. Uncontrolled type 2 diabetes mellitus with hyperglycemia ([place])  - Patient has currently uncontrolled symptomatic type 2 DM since  72 years of age. - She came with significantly improved blood glucose profile both fasting and postprandial after she was started on basal insulin.   Her recent A1c was high at 9.5%, increasing from 8.3%.    -her diabetes is complicated by chronic heavy smoking, sedentary life and Kristine Rodriguez remains at a high risk for more acute and chronic complications which include CAD, CVA, CKD, retinopathy, and neuropathy. These are all discussed in detail with the patient.  - I have counseled her on diet management by adopting a carbohydrate restricted/protein rich diet.  -  Suggestion is made for her to avoid simple carbohydrates  from her diet including Cakes, Sweet Desserts / Pastries, Ice Cream, Soda (diet and regular), Sweet Tea, Candies, Chips, Cookies, Store Bought Juices, Alcohol in Excess of  1-2 drinks a day, Artificial Sweeteners, and "Sugar-free" Products. This will help patient to have stable blood glucose profile and potentially avoid unintended weight gain.  - I encouraged her to switch to  unprocessed or minimally processed complex starch and increased protein intake (animal or plant source), fruits, and vegetables.  - she is advised to stick to a routine mealtimes to eat 3 meals  a day and avoid unnecessary snacks ( to snack only to correct hypoglycemia).   - she will be scheduled with Jearld Fenton, RDN, CDE for individualized diabetes education- consult pending.  - I have approached her with the following individualized plan to manage diabetes and patient agrees:   -She came with significant improvement in her glycemic profile.    - I approached her to stay on basal insulin Toujeo 20 units nightly  associated with monitoring of blood glucose 2 times a day-daily before breakfast and at bedtime.    - I will continue metformin 1000 mg by mouth twice a day, therapeutically suitable for patient .  - Patient specific target  A1c;  LDL, HDL, Triglycerides, and  Waist Circumference were discussed in detail.  2) BP/HTN:  Her blood pressure is controlled to target.  She is advised to continue her current medications including  ARB. 3) Lipids/HPL:   Uncontrolled, patient is not on a statin. She is on omega-3 fatty acids given triglyceride level of 383.  She will be considered for low-dose statin therapy next visit.    4)  Weight/Diet: CDE Consult will be initiated , exercise, and detailed carbohydrates information provided.  5) Chronic Care/Health Maintenance:  -she  is on ARB and not on  Statin medications and  is encouraged to continue to follow up with Ophthalmology, Dentist,  Podiatrist at least yearly or according to recommendations, and advised to  quit smoking. I have recommended yearly flu vaccine and pneumonia vaccination at least every 5 years; moderate intensity exercise for up to 150 minutes weekly; and  sleep for at least 7 hours a day.  - Time spent with the patient: 25 min, of which >50% was spent in reviewing her blood glucose logs , discussing her hypo- and hyper-glycemic episodes, reviewing her current and  previous labs and insulin doses and developing a plan to avoid hypo- and hyper-glycemia. Please refer  to Patient Instructions for Blood Glucose Monitoring and Insulin/Medications Dosing Guide"  in media tab for additional information.  - I advised patient to maintain close follow up with Sharilyn Sites, MD for primary care needs.    Follow up plan: - Return in about 3 months (around 02/21/2018) for meter, and logs, follow up with pre-visit labs, meter, and logs.  Glade Lloyd, MD [facility] [address] [place], [place]  [address] Phone: [phone]  Fax: [phone]    11/24/2017, 12:41 PM  This note was partially dictated with voice recognition software. Similar sounding words can be transcribed inadequately or may not  be corrected upon review.

## 2017-11-24 NOTE — Patient Instructions (Signed)

## 2017-11-25 ENCOUNTER — Encounter: Payer: Self-pay | Admitting: Nutrition

## 2017-11-25 ENCOUNTER — Encounter: Payer: PPO | Attending: Family Medicine | Admitting: Nutrition

## 2017-11-25 VITALS — Ht 61.0 in | Wt 133.0 lb

## 2017-11-25 DIAGNOSIS — E119 Type 2 diabetes mellitus without complications: Secondary | ICD-10-CM | POA: Diagnosis not present

## 2017-11-25 DIAGNOSIS — F17200 Nicotine dependence, unspecified, uncomplicated: Secondary | ICD-10-CM | POA: Insufficient documentation

## 2017-11-25 DIAGNOSIS — IMO0002 Reserved for concepts with insufficient information to code with codable children: Secondary | ICD-10-CM

## 2017-11-25 DIAGNOSIS — E785 Hyperlipidemia, unspecified: Secondary | ICD-10-CM | POA: Diagnosis not present

## 2017-11-25 DIAGNOSIS — Z713 Dietary counseling and surveillance: Secondary | ICD-10-CM | POA: Diagnosis not present

## 2017-11-25 DIAGNOSIS — E1165 Type 2 diabetes mellitus with hyperglycemia: Secondary | ICD-10-CM

## 2017-11-25 DIAGNOSIS — E782 Mixed hyperlipidemia: Secondary | ICD-10-CM

## 2017-11-25 DIAGNOSIS — E118 Type 2 diabetes mellitus with unspecified complications: Secondary | ICD-10-CM

## 2017-11-25 NOTE — Patient Instructions (Addendum)
Goals 1. Follow My Plate 2. Eat 2-3 carb choices per meals 3. Keep drinking water dailey. 4. Exercise 30 minutes twice  A week. Get A1C down to 7% Take 20 units of Toujeo at night about 9 pm daily.

## 2017-11-25 NOTE — Progress Notes (Signed)
Diabetes Self-Management Education  Visit Type: First/Initial  Appt. Start Time: 0800 Appt. End Time: 0930  11/25/2017  [name], identified by name and date of birth, is a 72 y.o. female with a diagnosis of Diabetes: Type 2., Hyperlipidemia. Sees Dr. Dorris Fetch, Endocrinology.   Had DM for about 10+ yrs. Never meet with an RD/CDE before. Has been avoiding carbs. Still smokes. Working on cutting back.Kristine Rodriguez for herself. Eats out frequently.  A1C 9.5%. Was recently started on 20 units of Toujeo and takes 1000 MG BID of Metformin.. BS are much better since taking insulin. BS are now lower into 100-180's. Still having some blood sugars higher than 200's but has been restricting carbs at meals thinking that was what she should do.   Not exercising but willing to start going to [facility] or use her elliptical she has. Current diet is inconsistent to meet her needs. Needs more high fiber foods with more fresh fruits and vegetables and less high fat high salt processed foods.    Has hyperlipidemia and is not on a statin or baby aspirin. Dr. Dorris Fetch will address at next visit per notes.   Lipid Panel     Component Value Date/Time   CHOL 208 (A) 07/13/2017   TRIG 383 (A) 07/13/2017   HDL 47 07/13/2017   LDLCALC 84 07/13/2017     CMP Latest Ref Rng & Units 10/20/2017 07/13/2017 09/16/2007  Glucose 65 - 99 mg/dL 191(H) - 141(H)  BUN 8 - 27 mg/dL 20 26(A) 14  Creatinine 0.57 - 1.00 mg/dL 0.83 0.8 0.77  Sodium 134 - 144 mmol/L 137 - 141  Potassium 3.5 - 5.2 mmol/L 4.9 - 4.4  Chloride 96 - 106 mmol/L 100 - 103  CO2 20 - 29 mmol/L 17(L) - 28  Calcium 8.7 - 10.3 mg/dL 10.3 - 9.3  Total Protein 6.0 - 8.5 g/dL 6.9 - 6.8  Total Bilirubin 0.0 - 1.2 mg/dL 0.2 - 0.6  Alkaline Phos 39 - 117 IU/L 71 - 64  AST 0 - 40 IU/L 12 - 17  ALT 0 - 32 IU/L 9 - 17    Lab Results  Component Value Date   HGBA1C 9.5 (H) 10/20/2017     ASSESSMENT  Height 5\' 1"  (1.549 m), weight 133 lb (60.3 kg). Body mass index is  25.13 kg/m.  Diabetes Self-Management Education - 11/25/17 0810      Visit Information   Visit Type  First/Initial      Initial Visit   Diabetes Type  Type 2    Are you currently following a meal plan?  No    Are you taking your medications as prescribed?  Yes    Date Diagnosed  2000      Health Coping   How would you rate your overall health?  Good      Psychosocial Assessment   Patient Belief/Attitude about Diabetes  Motivated to manage diabetes    Self-care barriers  None    Self-management support  Doctor's office    Other persons present  Patient    Patient Concerns  Monitoring;Healthy Lifestyle;Nutrition/Meal planning;Medication    Special Needs  None    Preferred Learning Style  No preference indicated    Learning Readiness  Change in progress    How often do you need to have someone help you when you read instructions, pamphlets, or other written materials from your doctor or pharmacy?  1 - Never    What is the last grade level you  completed in school?  12      Pre-Education Assessment   Patient understands the diabetes disease and treatment process.  Needs Review    Patient understands incorporating nutritional management into lifestyle.  Needs Review    Patient undertands incorporating physical activity into lifestyle.  Needs Review    Patient understands using medications safely.  Needs Review    Patient understands monitoring blood glucose, interpreting and using results  Needs Review    Patient understands prevention, detection, and treatment of acute complications.  Needs Review    Patient understands prevention, detection, and treatment of chronic complications.  Needs Review    Patient understands how to develop strategies to address psychosocial issues.  Needs Review    Patient understands how to develop strategies to promote health/change behavior.  Needs Review      Complications   Last HgB A1C per patient/outside source  9 %    How often do you check your  blood sugar?  1-2 times/day    Fasting Blood glucose range (mg/dL)  70-129    Postprandial Blood glucose range (mg/dL)  180-200    Number of hypoglycemic episodes per month  0    Number of hyperglycemic episodes per week  5    Can you tell when your blood sugar is high?  No    Have you had a dilated eye exam in the past 12 months?  No    Have you had a dental exam in the past 12 months?  No    Are you checking your feet?  Yes    How many days per week are you checking your feet?  7      Dietary Intake   Breakfast  Cottage cheese, fruit or apple with cheddar cheese or spam/eggs,  coffee with splenda and coffee mate    Lunch  meat, broccolli, collard, green beans,  water or milk    Dinner  peanuts handful, pineapple, 1/2 c, water    Beverage(s)  water      Exercise   Exercise Type  ADL's      Patient Education   Previous Diabetes Education  No    Disease state   Definition of diabetes, type 1 and 2, and the diagnosis of diabetes;Factors that contribute to the development of diabetes;Explored patient's options for treatment of their diabetes    Nutrition management   Role of diet in the treatment of diabetes and the relationship between the three main macronutrients and blood glucose level;Food label reading, portion sizes and measuring food.;Carbohydrate counting    Physical activity and exercise   Role of exercise on diabetes management, blood pressure control and cardiac health.;Identified with patient nutritional and/or medication changes necessary with exercise.    Medications  Taught/reviewed insulin injection, site rotation, insulin storage and needle disposal.;Reviewed patients medication for diabetes, action, purpose, timing of dose and side effects.    Monitoring  Purpose and frequency of SMBG.;Taught/discussed recording of test results and interpretation of SMBG.;Daily foot exams;Interpreting lab values - A1C, lipid, urine microalbumina.    Acute complications  Taught treatment of  hypoglycemia - the 15 rule.;Discussed and identified patients' treatment of hyperglycemia.    Chronic complications  Relationship between chronic complications and blood glucose control;Lipid levels, blood glucose control and heart disease;Dental care;Assessed and discussed foot care and prevention of foot problems;Identified and discussed with patient  current chronic complications    Psychosocial adjustment  Role of stress on diabetes;Helped patient identify a support system for diabetes  management;Travel strategies    Personal strategies to promote health  Review risk of smoking and offered smoking cessation;Helped patient develop diabetes management plan for (enter comment);Lifestyle issues that need to be addressed for better diabetes care      Individualized Goals (developed by patient)   Nutrition  Follow meal plan discussed;General guidelines for healthy choices and portions discussed    Physical Activity  Exercise 3-5 times per week;30 minutes per day    Medications  take my medication as prescribed    Monitoring   test my blood glucose as discussed    Reducing Risk  examine blood glucose patterns;stop smoking;get labs drawn;do foot checks daily;treat hypoglycemia with 15 grams of carbs if blood glucose less than 70mg /dL;increase portions of nuts and seeds      Post-Education Assessment   Patient understands the diabetes disease and treatment process.  Needs Review    Patient understands incorporating nutritional management into lifestyle.  Needs Review    Patient undertands incorporating physical activity into lifestyle.  Needs Review    Patient understands using medications safely.  Needs Review    Patient understands monitoring blood glucose, interpreting and using results  Needs Review    Patient understands prevention, detection, and treatment of acute complications.  Needs Review    Patient understands prevention, detection, and treatment of chronic complications.  Needs Review     Patient understands how to develop strategies to address psychosocial issues.  Needs Review    Patient understands how to develop strategies to promote health/change behavior.  Needs Review      Outcomes   Expected Outcomes  Demonstrated interest in learning. Expect positive outcomes    Future DMSE  3-4 months    Program Status  Completed       Individualized Plan for Diabetes Self-Management Training:   Learning Objective:  Patient will have a greater understanding of diabetes self-management. Patient education plan is to attend individual and/or group sessions per assessed needs and concerns.   Plan:   Patient Instructions  Goals 1. Follow My Plate 2. Eat 2-3 carb choices per meals 3. Keep drinking water dailey. 4. Exercise 30 minutes twice  A week. Get A1C down to 7% Taking 20 units of Toujeo daily.    Expected Outcomes:  Demonstrated interest in learning. Expect positive outcomes  Education material provided: Living Well with Diabetes, Food label handouts, A1C conversion sheet, Meal plan card, My Plate and Carbohydrate counting sheet  If problems or questions, patient to contact team via:  Phone and Email  Future DSME appointment: 3-4 months  Follow up on statin and baby aspirin for hyperlipidemia.

## 2017-11-30 ENCOUNTER — Telehealth: Payer: Self-pay | Admitting: "Endocrinology

## 2017-11-30 ENCOUNTER — Other Ambulatory Visit: Payer: Self-pay

## 2017-11-30 MED ORDER — INSULIN GLARGINE 300 UNIT/ML ~~LOC~~ SOPN: 20.0000 [IU] | PEN_INJECTOR | Freq: Every day | SUBCUTANEOUS | 2 refills | Status: DC

## 2017-11-30 NOTE — Telephone Encounter (Signed)
No changes for now.

## 2017-11-30 NOTE — Telephone Encounter (Signed)
Kristine Rodriguez 02/15 AM-167 PM-264  Sat 02/16 AM-122 PM-232  Sun 02/17 AM-140 PM-205  Mon 02/18 AM 133  Please advise of any changes if necessary

## 2017-11-30 NOTE — Telephone Encounter (Signed)
Left message for pt to call back  °

## 2017-12-01 NOTE — Telephone Encounter (Signed)
Pt notified by VM 

## 2017-12-08 ENCOUNTER — Other Ambulatory Visit: Payer: Self-pay

## 2017-12-08 MED ORDER — GLUCOSE BLOOD VI STRP: ORAL_STRIP | 5 refills | Status: DC

## 2018-02-03 ENCOUNTER — Other Ambulatory Visit: Payer: Self-pay | Admitting: "Endocrinology

## 2018-02-16 ENCOUNTER — Other Ambulatory Visit: Payer: Self-pay | Admitting: "Endocrinology

## 2018-02-16 DIAGNOSIS — E1165 Type 2 diabetes mellitus with hyperglycemia: Secondary | ICD-10-CM | POA: Diagnosis not present

## 2018-02-17 LAB — COMPREHENSIVE METABOLIC PANEL
A/G RATIO: 1.6 (ref 1.2–2.2)
ALBUMIN: 4.1 g/dL (ref 3.5–4.8)
ALK PHOS: 69 IU/L (ref 39–117)
ALT: 6 IU/L (ref 0–32)
AST: 9 IU/L (ref 0–40)
BUN/Creatinine Ratio: 33 — ABNORMAL HIGH (ref 12–28)
BUN: 32 mg/dL — ABNORMAL HIGH (ref 8–27)
Bilirubin Total: 0.2 mg/dL (ref 0.0–1.2)
CO2: 18 mmol/L — AB (ref 20–29)
Calcium: 9.8 mg/dL (ref 8.7–10.3)
Chloride: 101 mmol/L (ref 96–106)
Creatinine, Ser: 0.96 mg/dL (ref 0.57–1.00)
GFR calc Af Amer: 69 mL/min/{1.73_m2} (ref >59)
GFR calc non Af Amer: 60 mL/min/{1.73_m2} (ref >59)
GLUCOSE: 263 mg/dL — AB (ref 65–99)
Globulin, Total: 2.5 g/dL (ref 1.5–4.5)
Potassium: 4.7 mmol/L (ref 3.5–5.2)
Sodium: 141 mmol/L (ref 134–144)
Total Protein: 6.6 g/dL (ref 6.0–8.5)

## 2018-02-17 LAB — HGB A1C W/O EAG: Hgb A1c MFr Bld: 9 % — ABNORMAL HIGH (ref 4.8–5.6)

## 2018-02-22 ENCOUNTER — Encounter: Payer: Self-pay | Admitting: "Endocrinology

## 2018-02-22 ENCOUNTER — Ambulatory Visit (INDEPENDENT_AMBULATORY_CARE_PROVIDER_SITE_OTHER): Payer: PPO | Admitting: "Endocrinology

## 2018-02-22 ENCOUNTER — Encounter: Payer: Self-pay | Admitting: Nutrition

## 2018-02-22 ENCOUNTER — Encounter: Payer: PPO | Attending: Family Medicine | Admitting: Nutrition

## 2018-02-22 VITALS — BP 101/61 | HR 71 | Ht 61.0 in | Wt 132.0 lb

## 2018-02-22 VITALS — Ht 61.0 in | Wt 132.0 lb

## 2018-02-22 DIAGNOSIS — Z713 Dietary counseling and surveillance: Secondary | ICD-10-CM | POA: Diagnosis not present

## 2018-02-22 DIAGNOSIS — E1165 Type 2 diabetes mellitus with hyperglycemia: Secondary | ICD-10-CM

## 2018-02-22 DIAGNOSIS — F17200 Nicotine dependence, unspecified, uncomplicated: Secondary | ICD-10-CM | POA: Diagnosis not present

## 2018-02-22 DIAGNOSIS — E118 Type 2 diabetes mellitus with unspecified complications: Secondary | ICD-10-CM

## 2018-02-22 DIAGNOSIS — I10 Essential (primary) hypertension: Secondary | ICD-10-CM | POA: Diagnosis not present

## 2018-02-22 DIAGNOSIS — IMO0002 Reserved for concepts with insufficient information to code with codable children: Secondary | ICD-10-CM

## 2018-02-22 DIAGNOSIS — E782 Mixed hyperlipidemia: Secondary | ICD-10-CM

## 2018-02-22 MED ORDER — INSULIN GLARGINE 300 UNIT/ML ~~LOC~~ SOPN: 30.0000 [IU] | PEN_INJECTOR | Freq: Every day | SUBCUTANEOUS | 2 refills | Status: DC

## 2018-02-22 NOTE — Progress Notes (Signed)
Diabetes Self-Management Education  Visit Type:    Appt. Start Time: 1130 Appt. End Time:1215 02/22/2018  [name], identified by name and date of birth, is a 72 y.o. female with a diagnosis of Diabetes:  .Saw Dr. Dorris Fetch today. A1C down to 9% from 9.5%. She is increased in her Toujeo to 30 units a day starting today along with Metformin 1000 mg BID.  She notes she is about be  in donut hole and cost of meds is an issue.Gave her PAP packet to fill out and bring back to DR. Nida's office. Still smoking. Eating a little better but diet needs more fresh fruits, whole grains. Still smoking a lot. Says she will work on cutting back     No treatment for Hyperlipidemia yet. Encouraged a high fiber diet.  Lipid Panel     Component Value Date/Time   CHOL 208 (A) 07/13/2017   TRIG 383 (A) 07/13/2017   HDL 47 07/13/2017   LDLCALC 84 07/13/2017     CMP Latest Ref Rng & Units 02/16/2018 10/20/2017 07/13/2017  Glucose 65 - 99 mg/dL 263(H) 191(H) -  BUN 8 - 27 mg/dL 32(H) 20 26(A)  Creatinine 0.57 - 1.00 mg/dL 0.96 0.83 0.8  Sodium 134 - 144 mmol/L 141 137 -  Potassium 3.5 - 5.2 mmol/L 4.7 4.9 -  Chloride 96 - 106 mmol/L 101 100 -  CO2 20 - 29 mmol/L 18(L) 17(L) -  Calcium 8.7 - 10.3 mg/dL 9.8 10.3 -  Total Protein 6.0 - 8.5 g/dL 6.6 6.9 -  Total Bilirubin 0.0 - 1.2 mg/dL 0.2 0.2 -  Alkaline Phos 39 - 117 IU/L 69 71 -  AST 0 - 40 IU/L 9 12 -  ALT 0 - 32 IU/L 6 9 -    Lab Results  Component Value Date   HGBA1C 9.0 (H) 02/16/2018     ASSESSMENT  Height 5\' 1"  (1.549 m), weight 132 lb (59.9 kg). Body mass index is 24.94 kg/m.    Individualized Plan for Diabetes Self-Management Training:   Learning Objective:  Patient will have a greater understanding of diabetes self-management. Patient education plan is to attend individual and/or group sessions per assessed needs and concerns.   Plan:   Goals 1. Cut down on smoking 2. Increase vegetables with lunch and dinner 3. Eat fruit  only with meals 4. Increase water intake to 5 bottles per day. Get A1C down to 8%.  Expected Outcomes:    Improved self management of DM.  Education material provided: Living Well with Diabetes, Food label handouts, A1C conversion sheet, Meal plan card, My Plate and Carbohydrate counting sheet  If problems or questions, patient to contact team via:  Phone and Email  Future DSME appointment:    Follow up on statin and baby aspirin for hyperlipidemia.

## 2018-02-22 NOTE — Patient Instructions (Signed)
Goals 1. Cut down on smoking 2. Increase vegetables with lunch and dinner 3. Eat fruit only with meals 4. Increase water intake to 5 bottles per day. Get A1C down to 8%.

## 2018-02-22 NOTE — Progress Notes (Signed)
Consult Note       02/22/2018, 1:33 PM   Subjective:    Patient ID: Kristine Rodriguez, female    DOB: Feb 05, 1946.  she is being seen in consultation for management of currently uncontrolled symptomatic diabetes requested by  Sharilyn Sites, MD.   Past Medical History:  Diagnosis Date  . Cancer ([place])    hx of breast cancer left  . Diabetes mellitus   . Hypertension    Past Surgical History:  Procedure Laterality Date  . bladder tack    . COLONOSCOPY  06/04/2012   Procedure: COLONOSCOPY;  Surgeon: Jamesetta So, MD;  Location: [facility];  Service: Gastroenterology;  Laterality: N/A;  . ESOPHAGOGASTRODUODENOSCOPY  06/04/2012   Procedure: ESOPHAGOGASTRODUODENOSCOPY (EGD);  Surgeon: Jamesetta So, MD;  Location: [facility];  Service: Gastroenterology;  Laterality: N/A;  . HERNIA REPAIR    . left mastectomy    . TONSILLECTOMY    . VESICO-VAGINAL FISTULA REPAIR     Social History   Socioeconomic History  . Marital status: Divorced    Spouse name: Not on file  . Number of children: Not on file  . Years of education: Not on file  . Highest education level: Not on file  Occupational History  . Not on file  Social Needs  . Financial resource strain: Not on file  . Food insecurity:    Worry: Not on file    Inability: Not on file  . Transportation needs:    Medical: Not on file    Non-medical: Not on file  Tobacco Use  . Smoking status: Current Every Day Smoker    Packs/day: 0.50    Types: Cigarettes  . Smokeless tobacco: Never Used  Substance and Sexual Activity  . Alcohol use: Yes    Comment: socially drink  . Drug use: No  . Sexual activity: Yes  Lifestyle  . Physical activity:    Days per week: Not on file    Minutes per session: Not on file  . Stress: Not on file  Relationships  . Social connections:    Talks on phone: Not on file    Gets together: Not on file    Attends  religious service: Not on file    Active member of club or organization: Not on file    Attends meetings of clubs or organizations: Not on file    Relationship status: Not on file  Other Topics Concern  . Not on file  Social History Narrative  . Not on file   Outpatient Encounter Medications as of 02/22/2018  Medication Sig  . Vitamin D, Ergocalciferol, 2000 units CAPS Take by mouth daily.  Marland Kitchen albuterol (PROVENTIL) (2.5 MG/3ML) 0.083% nebulizer solution Take 2.5 mg by nebulization every 6 (six) hours as needed for wheezing or shortness of breath.  . Fexofenadine HCl (MUCINEX ALLERGY PO) Take by mouth.  Marland Kitchen glucose blood test strip Use as instructed bid. E11.65  . Insulin Glargine (TOUJEO SOLOSTAR) 300 UNIT/ML SOPN Inject 30 Units into the skin at bedtime.  . Insulin Pen Needle (B-D ULTRAFINE III SHORT PEN) 31G X 8 MM MISC 1  each by Does not apply route at bedtime.  . metFORMIN (GLUCOPHAGE) 1000 MG tablet TAKE 1 TABLET BY MOUTH TWICE A DAY WITH FOOD  . Omega-3 Fatty Acids (FISH OIL OMEGA-3 PO) Take by mouth daily.  Marland Kitchen omeprazole (PRILOSEC) 20 MG capsule Take 1 capsule (20 mg total) by mouth daily.  . valsartan-hydrochlorothiazide (DIOVAN-HCT) 160-12.5 MG tablet Take 1 tablet by mouth daily.  . [DISCONTINUED] Insulin Glargine (TOUJEO SOLOSTAR) 300 UNIT/ML SOPN Inject 20 Units into the skin at bedtime.  . [DISCONTINUED] Vitamin D, Ergocalciferol, (DRISDOL) 50000 units CAPS capsule Take 50,000 Units by mouth every 7 (seven) days.   No facility-administered encounter medications on file as of 02/22/2018.     ALLERGIES: Allergies  Allergen Reactions  . Duricef [Cefadroxil] Shortness Of Breath and Swelling  . Tape Rash    VACCINATION STATUS:  There is no immunization history on file for this patient.  Diabetes  She presents for her follow-up diabetic visit. She has type 2 diabetes mellitus. Onset time: She was diagnosed at approximate age of 72 years. Her disease course has been improving.  There are no hypoglycemic associated symptoms. Pertinent negatives for hypoglycemia include no confusion, headaches, pallor or seizures. Associated symptoms include fatigue. Pertinent negatives for diabetes include no chest pain, no polydipsia, no polyphagia and no polyuria. There are no hypoglycemic complications. Symptoms are improving. There are no diabetic complications. Risk factors for coronary artery disease include diabetes mellitus, dyslipidemia, family history, hypertension, sedentary lifestyle, tobacco exposure and post-menopausal. Current diabetic treatment includes oral agent (dual therapy). Her weight is stable. She is following a generally unhealthy diet. When asked about meal planning, she reported none. She has not had a previous visit with a dietitian. She participates in exercise intermittently. Her breakfast blood glucose range is generally 140-180 mg/dl. Her bedtime blood glucose range is generally >200 mg/dl. Her overall blood glucose range is 180-200 mg/dl. An ACE inhibitor/angiotensin II receptor blocker is being taken. She does not see a podiatrist.Eye exam is current.  Hyperlipidemia  This is a chronic problem. The current episode started more than 1 year ago. Exacerbating diseases include diabetes. She has no history of obesity. Pertinent negatives include no chest pain, myalgias or shortness of breath. Current antihyperlipidemic treatment includes fibric acid derivatives. Risk factors for coronary artery disease include diabetes mellitus, dyslipidemia, family history, hypertension and a sedentary lifestyle.  Hypertension  This is a chronic problem. The current episode started more than 1 year ago. The problem is controlled. Pertinent negatives include no chest pain, headaches, palpitations or shortness of breath. Risk factors for coronary artery disease include diabetes mellitus, dyslipidemia, smoking/tobacco exposure, sedentary lifestyle and family history. Past treatments include  angiotensin blockers.    Review of Systems  Constitutional: Positive for fatigue. Negative for chills, fever and unexpected weight change.  HENT: Negative for trouble swallowing and voice change.   Eyes: Negative for visual disturbance.  Respiratory: Negative for cough, shortness of breath and wheezing.   Cardiovascular: Negative for chest pain, palpitations and leg swelling.  Gastrointestinal: Negative for diarrhea, nausea and vomiting.  Endocrine: Negative for cold intolerance, heat intolerance, polydipsia, polyphagia and polyuria.  Musculoskeletal: Negative for arthralgias and myalgias.  Skin: Negative for color change, pallor, rash and wound.  Neurological: Negative for seizures and headaches.  Psychiatric/Behavioral: Negative for confusion and suicidal ideas.    Objective:    BP 101/61   Pulse 71   Ht 5\' 1"  (1.549 m)   Wt 132 lb (59.9 kg)   BMI 24.94 kg/m  Wt Readings from Last 3 Encounters:  02/22/18 132 lb (59.9 kg)  02/22/18 132 lb (59.9 kg)  11/25/17 133 lb (60.3 kg)     Physical Exam  Constitutional: She is oriented to person, place, and time. She appears well-developed.  HENT:  Head: Normocephalic and atraumatic.  Eyes: EOM are normal.  Neck: Normal range of motion. Neck supple. No tracheal deviation present. No thyromegaly present.  Cardiovascular: Normal rate and regular rhythm.  Pulmonary/Chest: Effort normal and breath sounds normal.  Abdominal: Soft. Bowel sounds are normal. There is no tenderness. There is no guarding.  Musculoskeletal: Normal range of motion. She exhibits no edema.  Neurological: She is alert and oriented to person, place, and time. She has normal reflexes. No cranial nerve deficit. Coordination normal.  Skin: Skin is warm and dry. No rash noted. No erythema. No pallor.  Psychiatric: She has a normal mood and affect. Judgment normal.    CMP ( most recent) CMP     Component Value Date/Time   NA 141 02/16/2018 1129   K 4.7  02/16/2018 1129   CL 101 02/16/2018 1129   CO2 18 (L) 02/16/2018 1129   GLUCOSE 263 (H) 02/16/2018 1129   GLUCOSE 141 (H) 09/16/2007 1025   BUN 32 (H) 02/16/2018 1129   CREATININE 0.96 02/16/2018 1129   CALCIUM 9.8 02/16/2018 1129   PROT 6.6 02/16/2018 1129   ALBUMIN 4.1 02/16/2018 1129   AST 9 02/16/2018 1129   ALT 6 02/16/2018 1129   ALKPHOS 69 02/16/2018 1129   BILITOT 0.2 02/16/2018 1129   GFRNONAA 60 02/16/2018 1129   GFRAA 69 02/16/2018 1129   Recent Results (from the past 2160 hour(s))  Comprehensive metabolic panel     Status: Abnormal   Collection Time: 02/16/18 11:29 AM  Result Value Ref Range   Glucose 263 (H) 65 - 99 mg/dL   BUN 32 (H) 8 - 27 mg/dL   Creatinine, Ser 0.96 0.57 - 1.00 mg/dL   GFR calc non Af Amer 60 >59 mL/min/1.73   GFR calc Af Amer 69 >59 mL/min/1.73   BUN/Creatinine Ratio 33 (H) 12 - 28   Sodium 141 134 - 144 mmol/L   Potassium 4.7 3.5 - 5.2 mmol/L   Chloride 101 96 - 106 mmol/L   CO2 18 (L) 20 - 29 mmol/L   Calcium 9.8 8.7 - 10.3 mg/dL   Total Protein 6.6 6.0 - 8.5 g/dL   Albumin 4.1 3.5 - 4.8 g/dL   Globulin, Total 2.5 1.5 - 4.5 g/dL   Albumin/Globulin Ratio 1.6 1.2 - 2.2   Bilirubin Total 0.2 0.0 - 1.2 mg/dL   Alkaline Phosphatase 69 39 - 117 IU/L   AST 9 0 - 40 IU/L   ALT 6 0 - 32 IU/L  Hgb A1c w/o eAG     Status: Abnormal   Collection Time: 02/16/18 11:29 AM  Result Value Ref Range   Hgb A1c MFr Bld 9.0 (H) 4.8 - 5.6 %    Comment:          Prediabetes: 5.7 - 6.4          Diabetes: >6.4          Glycemic control for adults with diabetes: <7.0      Assessment & Plan:   1. Uncontrolled type 2 diabetes mellitus with hyperglycemia ([place])  - Patient has currently uncontrolled symptomatic type 2 DM since  72 years of age. - She came with significantly improved fasting blood glucose profile, still significantly  above target postprandial readings.    -She is only on basal insulin.  Her A1c has improved to 9% from 9.5%.   -Her recent  labs are reviewed, showing normal renal function.   -her diabetes is complicated by chronic heavy smoking, sedentary life and Kristine Rodriguez remains at a high risk for more acute and chronic complications which include CAD, CVA, CKD, retinopathy, and neuropathy. These are all discussed in detail with the patient.  - I have counseled her on diet management by adopting a carbohydrate restricted/protein rich diet.  -  Suggestion is made for her to avoid simple carbohydrates  from her diet including Cakes, Sweet Desserts / Pastries, Ice Cream, Soda (diet and regular), Sweet Tea, Candies, Chips, Cookies, Store Bought Juices, Alcohol in Excess of  1-2 drinks a day, Artificial Sweeteners, and "Sugar-free" Products. This will help patient to have stable blood glucose profile and potentially avoid unintended weight gain.   - I encouraged her to switch to  unprocessed or minimally processed complex starch and increased protein intake (animal or plant source), fruits, and vegetables.  - she is advised to stick to a routine mealtimes to eat 3 meals  a day and avoid unnecessary snacks ( to snack only to correct hypoglycemia).   - she is  scheduled with Jearld Fenton, RDN, CDE for individualized diabetes education.  - I have approached her with the following individualized plan to manage diabetes and patient agrees:   -She came with improvement in her overall glycemic profile.  Before considering prandial insulin, I advised her to increase her Toujeo to 30 units nightly, associated with strict monitoring of blood glucose 2 times a day-daily before breakfast and at bedtime.   - I will continue metformin 1000 mg by mouth twice a day, therapeutically suitable for patient .  - Patient specific target  A1c;  LDL, HDL, Triglycerides, and  Waist Circumference were discussed in detail.  2) BP/HTN: Her blood pressure is controlled to target.   She is advised to continue her current medications including  ARB. 3)  Lipids/HPL:   Uncontrolled, patient is not on a statin. She is on omega-3 fatty acids given triglyceride level of 383.  She will be considered for low-dose statin therapy next visit.    4)  Weight/Diet: CDE Consult will be initiated , exercise, and detailed carbohydrates information provided.  5) Chronic Care/Health Maintenance:  -she  is on ARB and not on  Statin medications and  is encouraged to continue to follow up with Ophthalmology, Dentist,  Podiatrist at least yearly or according to recommendations, and advised to  quit smoking. I have recommended yearly flu vaccine and pneumonia vaccination at least every 5 years; moderate intensity exercise for up to 150 minutes weekly; and  sleep for at least 7 hours a day.  - Time spent with the patient: 25 min, of which >50% was spent in reviewing her blood glucose logs , discussing her hypo- and hyper-glycemic episodes, reviewing her current and  previous labs and insulin doses and developing a plan to avoid hypo- and hyper-glycemia. Please refer to Patient Instructions for Blood Glucose Monitoring and Insulin/Medications Dosing Guide"  in media tab for additional information. Forestine Na participated in the discussions, expressed understanding, and voiced agreement with the above plans.  All questions were answered to her satisfaction. she is encouraged to contact clinic should she have any questions or concerns prior to her return visit.   - I advised patient to maintain close follow  up with Sharilyn Sites, MD for primary care needs.    Follow up plan: - Return in about 3 months (around 05/25/2018) for follow up with pre-visit labs, meter, and logs.  Glade Lloyd, MD [facility] [address] [place], [place] [address] Phone: [phone]  Fax: [phone]    02/22/2018, 1:33 PM  This note was partially dictated with voice recognition software. Similar sounding words can be transcribed  inadequately or may not  be corrected upon review.

## 2018-02-23 ENCOUNTER — Encounter: Payer: Self-pay | Admitting: Nutrition

## 2018-03-05 ENCOUNTER — Other Ambulatory Visit: Payer: Self-pay | Admitting: "Endocrinology

## 2018-03-10 ENCOUNTER — Telehealth: Payer: Self-pay

## 2018-03-10 NOTE — Telephone Encounter (Signed)
Pt states she has had high BG readings.   Date Before breakfast Before lunch Before supper Bedtime  5/27 120   452 - Had BDay cake  5/28 159   233  5/29 147             Pt taking:  MTF 1000 mg bid, Toujeo 30 units qhs

## 2018-03-10 NOTE — Telephone Encounter (Signed)
Increase Toujeo to 36 units, do not eat cake.

## 2018-03-10 NOTE — Telephone Encounter (Signed)
Pt notified and agrees. 

## 2018-05-07 ENCOUNTER — Other Ambulatory Visit: Payer: Self-pay | Admitting: "Endocrinology

## 2018-05-20 ENCOUNTER — Other Ambulatory Visit: Payer: Self-pay | Admitting: "Endocrinology

## 2018-05-20 DIAGNOSIS — E1165 Type 2 diabetes mellitus with hyperglycemia: Secondary | ICD-10-CM | POA: Diagnosis not present

## 2018-05-21 LAB — COMPREHENSIVE METABOLIC PANEL
ALK PHOS: 66 IU/L (ref 39–117)
ALT: 10 IU/L (ref 0–32)
AST: 10 IU/L (ref 0–40)
Albumin/Globulin Ratio: 1.6 (ref 1.2–2.2)
Albumin: 4.4 g/dL (ref 3.5–4.8)
BILIRUBIN TOTAL: 0.4 mg/dL (ref 0.0–1.2)
BUN/Creatinine Ratio: 27 (ref 12–28)
BUN: 25 mg/dL (ref 8–27)
CHLORIDE: 101 mmol/L (ref 96–106)
CO2: 20 mmol/L (ref 20–29)
Calcium: 10.3 mg/dL (ref 8.7–10.3)
Creatinine, Ser: 0.91 mg/dL (ref 0.57–1.00)
GFR calc Af Amer: 73 mL/min/{1.73_m2} (ref >59)
GFR calc non Af Amer: 64 mL/min/{1.73_m2} (ref >59)
GLUCOSE: 140 mg/dL — AB (ref 65–99)
Globulin, Total: 2.8 g/dL (ref 1.5–4.5)
Potassium: 4.7 mmol/L (ref 3.5–5.2)
Sodium: 142 mmol/L (ref 134–144)
TOTAL PROTEIN: 7.2 g/dL (ref 6.0–8.5)

## 2018-05-21 LAB — SPECIMEN STATUS REPORT

## 2018-05-21 LAB — HGB A1C W/O EAG: HEMOGLOBIN A1C: 8.2 % — AB (ref 4.8–5.6)

## 2018-05-27 ENCOUNTER — Ambulatory Visit (INDEPENDENT_AMBULATORY_CARE_PROVIDER_SITE_OTHER): Payer: PPO | Admitting: "Endocrinology

## 2018-05-27 ENCOUNTER — Encounter: Payer: Self-pay | Admitting: "Endocrinology

## 2018-05-27 VITALS — BP 130/77 | HR 81 | Ht 61.0 in | Wt 137.0 lb

## 2018-05-27 DIAGNOSIS — E1165 Type 2 diabetes mellitus with hyperglycemia: Secondary | ICD-10-CM | POA: Diagnosis not present

## 2018-05-27 DIAGNOSIS — E782 Mixed hyperlipidemia: Secondary | ICD-10-CM | POA: Diagnosis not present

## 2018-05-27 DIAGNOSIS — F17200 Nicotine dependence, unspecified, uncomplicated: Secondary | ICD-10-CM | POA: Diagnosis not present

## 2018-05-27 DIAGNOSIS — I10 Essential (primary) hypertension: Secondary | ICD-10-CM

## 2018-05-27 MED ORDER — INSULIN GLARGINE 300 UNIT/ML ~~LOC~~ SOPN: 30.0000 [IU] | PEN_INJECTOR | Freq: Every day | SUBCUTANEOUS | 2 refills | Status: DC

## 2018-05-27 NOTE — Progress Notes (Signed)
Endocrinology follow-up note       05/27/2018, 1:25 PM   Subjective:    Patient ID: Kristine Rodriguez, female    DOB: 06/08/1946.  she is being seen in follow-up for management of currently uncontrolled symptomatic diabetes requested by  Sharilyn Sites, MD.   Past Medical History:  Diagnosis Date  . Cancer ([place])    hx of breast cancer left  . Diabetes mellitus   . Hypertension    Past Surgical History:  Procedure Laterality Date  . bladder tack    . COLONOSCOPY  06/04/2012   Procedure: COLONOSCOPY;  Surgeon: Jamesetta So, MD;  Location: [facility];  Service: Gastroenterology;  Laterality: N/A;  . ESOPHAGOGASTRODUODENOSCOPY  06/04/2012   Procedure: ESOPHAGOGASTRODUODENOSCOPY (EGD);  Surgeon: Jamesetta So, MD;  Location: [facility];  Service: Gastroenterology;  Laterality: N/A;  . HERNIA REPAIR    . left mastectomy    . TONSILLECTOMY    . VESICO-VAGINAL FISTULA REPAIR     Social History   Socioeconomic History  . Marital status: Divorced    Spouse name: Not on file  . Number of children: Not on file  . Years of education: Not on file  . Highest education level: Not on file  Occupational History  . Not on file  Social Needs  . Financial resource strain: Not on file  . Food insecurity:    Worry: Not on file    Inability: Not on file  . Transportation needs:    Medical: Not on file    Non-medical: Not on file  Tobacco Use  . Smoking status: Current Every Day Smoker    Packs/day: 0.50    Types: Cigarettes  . Smokeless tobacco: Never Used  Substance and Sexual Activity  . Alcohol use: Yes    Comment: socially drink  . Drug use: No  . Sexual activity: Yes  Lifestyle  . Physical activity:    Days per week: Not on file    Minutes per session: Not on file  . Stress: Not on file  Relationships  . Social connections:    Talks on phone: Not on file    Gets together: Not on file     Attends religious service: Not on file    Active member of club or organization: Not on file    Attends meetings of clubs or organizations: Not on file    Relationship status: Not on file  Other Topics Concern  . Not on file  Social History Narrative  . Not on file   Outpatient Encounter Medications as of 05/27/2018  Medication Sig  . albuterol (PROVENTIL) (2.5 MG/3ML) 0.083% nebulizer solution Take 2.5 mg by nebulization every 6 (six) hours as needed for wheezing or shortness of breath.  . Fexofenadine HCl (MUCINEX ALLERGY PO) Take by mouth.  Marland Kitchen glucose blood test strip Use as instructed bid. E11.65  . Insulin Glargine (TOUJEO SOLOSTAR) 300 UNIT/ML SOPN Inject 30 Units into the skin at bedtime.  . Insulin Pen Needle (B-D ULTRAFINE III SHORT PEN) 31G X 8 MM MISC 1 each by Does not apply route at bedtime.  Marland Kitchen  metFORMIN (GLUCOPHAGE) 1000 MG tablet TAKE 1 TABLET BY MOUTH TWICE DAILY WITH FOOD  . Omega-3 Fatty Acids (FISH OIL OMEGA-3 PO) Take by mouth daily.  Marland Kitchen omeprazole (PRILOSEC) 20 MG capsule Take 1 capsule (20 mg total) by mouth daily.  . valsartan-hydrochlorothiazide (DIOVAN-HCT) 160-12.5 MG tablet TAKE 1 TABLET ONCE DAILY  . Vitamin D, Ergocalciferol, 2000 units CAPS Take by mouth daily.  . [DISCONTINUED] Insulin Glargine (TOUJEO SOLOSTAR) 300 UNIT/ML SOPN Inject 30 Units into the skin at bedtime. (Patient taking differently: Inject 36 Units into the skin at bedtime. )   No facility-administered encounter medications on file as of 05/27/2018.     ALLERGIES: Allergies  Allergen Reactions  . Duricef [Cefadroxil] Shortness Of Breath and Swelling  . Tape Rash    VACCINATION STATUS:  There is no immunization history on file for this patient.  Diabetes  She presents for her follow-up diabetic visit. She has type 2 diabetes mellitus. Onset time: She was diagnosed at approximate age of 3 years. Her disease course has been improving. There are no hypoglycemic associated symptoms.  Pertinent negatives for hypoglycemia include no confusion, headaches, pallor or seizures. Associated symptoms include fatigue. Pertinent negatives for diabetes include no chest pain, no polydipsia, no polyphagia and no polyuria. There are no hypoglycemic complications. Symptoms are improving. There are no diabetic complications. Risk factors for coronary artery disease include diabetes mellitus, dyslipidemia, family history, hypertension, sedentary lifestyle, tobacco exposure and post-menopausal. Current diabetic treatment includes oral agent (dual therapy). Her weight is increasing steadily. She is following a generally unhealthy diet. When asked about meal planning, she reported none. She has not had a previous visit with a dietitian. She participates in exercise intermittently. Her breakfast blood glucose range is generally 130-140 mg/dl. Her bedtime blood glucose range is generally 180-200 mg/dl. Her overall blood glucose range is 180-200 mg/dl. An ACE inhibitor/angiotensin II receptor blocker is being taken. She does not see a podiatrist.Eye exam is current.  Hyperlipidemia  This is a chronic problem. The current episode started more than 1 year ago. Exacerbating diseases include diabetes. She has no history of obesity. Pertinent negatives include no chest pain, myalgias or shortness of breath. Current antihyperlipidemic treatment includes fibric acid derivatives. Risk factors for coronary artery disease include diabetes mellitus, dyslipidemia, family history, hypertension and a sedentary lifestyle.  Hypertension  This is a chronic problem. The current episode started more than 1 year ago. The problem is controlled. Pertinent negatives include no chest pain, headaches, palpitations or shortness of breath. Risk factors for coronary artery disease include diabetes mellitus, dyslipidemia, smoking/tobacco exposure, sedentary lifestyle and family history. Past treatments include angiotensin blockers.     Review of Systems  Constitutional: Positive for fatigue. Negative for chills, fever and unexpected weight change.  HENT: Negative for trouble swallowing and voice change.   Eyes: Negative for visual disturbance.  Respiratory: Negative for cough, shortness of breath and wheezing.   Cardiovascular: Negative for chest pain, palpitations and leg swelling.  Gastrointestinal: Negative for diarrhea, nausea and vomiting.  Endocrine: Negative for cold intolerance, heat intolerance, polydipsia, polyphagia and polyuria.  Musculoskeletal: Negative for arthralgias and myalgias.  Skin: Negative for color change, pallor, rash and wound.  Neurological: Negative for seizures and headaches.  Psychiatric/Behavioral: Negative for confusion and suicidal ideas.    Objective:    BP 130/77   Pulse 81   Ht 5\' 1"  (1.549 m)   Wt 137 lb (62.1 kg)   BMI 25.89 kg/m   Wt Readings from Last 3  Encounters:  05/27/18 137 lb (62.1 kg)  02/22/18 132 lb (59.9 kg)  02/22/18 132 lb (59.9 kg)     Physical Exam  Constitutional: She is oriented to person, place, and time. She appears well-developed.  HENT:  Head: Normocephalic and atraumatic.  Eyes: EOM are normal.  Neck: Normal range of motion. Neck supple. No tracheal deviation present. No thyromegaly present.  Cardiovascular: Normal rate.  Pulmonary/Chest: Effort normal.  Abdominal: There is no tenderness. There is no guarding.  Musculoskeletal: Normal range of motion. She exhibits no edema.  Neurological: She is alert and oriented to person, place, and time. No cranial nerve deficit. Coordination normal.  Skin: Skin is warm and dry. No rash noted. No erythema. No pallor.  Psychiatric: She has a normal mood and affect. Judgment normal.    CMP ( most recent) CMP     Component Value Date/Time   NA 142 05/20/2018 1045   K 4.7 05/20/2018 1045   CL 101 05/20/2018 1045   CO2 20 05/20/2018 1045   GLUCOSE 140 (H) 05/20/2018 1045   GLUCOSE 141 (H)  09/16/2007 1025   BUN 25 05/20/2018 1045   CREATININE 0.91 05/20/2018 1045   CALCIUM 10.3 05/20/2018 1045   PROT 7.2 05/20/2018 1045   ALBUMIN 4.4 05/20/2018 1045   AST 10 05/20/2018 1045   ALT 10 05/20/2018 1045   ALKPHOS 66 05/20/2018 1045   BILITOT 0.4 05/20/2018 1045   GFRNONAA 64 05/20/2018 1045   GFRAA 73 05/20/2018 1045   Recent Results (from the past 2160 hour(s))  Comprehensive metabolic panel     Status: Abnormal   Collection Time: 05/20/18 10:45 AM  Result Value Ref Range   Glucose 140 (H) 65 - 99 mg/dL   BUN 25 8 - 27 mg/dL   Creatinine, Ser 0.91 0.57 - 1.00 mg/dL   GFR calc non Af Amer 64 >59 mL/min/1.73   GFR calc Af Amer 73 >59 mL/min/1.73   BUN/Creatinine Ratio 27 12 - 28   Sodium 142 134 - 144 mmol/L   Potassium 4.7 3.5 - 5.2 mmol/L   Chloride 101 96 - 106 mmol/L   CO2 20 20 - 29 mmol/L   Calcium 10.3 8.7 - 10.3 mg/dL   Total Protein 7.2 6.0 - 8.5 g/dL   Albumin 4.4 3.5 - 4.8 g/dL   Globulin, Total 2.8 1.5 - 4.5 g/dL   Albumin/Globulin Ratio 1.6 1.2 - 2.2   Bilirubin Total 0.4 0.0 - 1.2 mg/dL   Alkaline Phosphatase 66 39 - 117 IU/L   AST 10 0 - 40 IU/L   ALT 10 0 - 32 IU/L  Hgb A1c w/o eAG     Status: Abnormal   Collection Time: 05/20/18 10:45 AM  Result Value Ref Range   Hgb A1c MFr Bld 8.2 (H) 4.8 - 5.6 %    Comment:          Prediabetes: 5.7 - 6.4          Diabetes: >6.4          Glycemic control for adults with diabetes: <7.0   Specimen status report     Status: None   Collection Time: 05/20/18 10:45 AM  Result Value Ref Range   specimen status report Comment     Comment: [facility] CMP14 Default Ambig Abbrev CMP14 Default A hand-written panel/profile was received from your office. In accordance with the [facility] Ambiguous Test Code Policy dated July 3710, we have completed your order by using the closest currently or  formerly recognized AMA panel.  We have assigned Comprehensive Metabolic Panel (14), Test Code #322000 to this request.  If  this is not the testing you wished to receive on this specimen, please contact the [facility] Client Inquiry/Technical Services Department to clarify the test order.  We appreciate your business.      Assessment & Plan:   1. Uncontrolled type 2 diabetes mellitus with hyperglycemia ([place])  - Patient has currently uncontrolled symptomatic type 2 DM since  72 years of age. - She came with significantly improved fasting blood glucose profile, still significantly above target postprandial readings.    -She is only on basal insulin, along with her metformin.  Her previsit labs show A1c of 8.2%, progressively improving from 9.5%.  -Her recent labs are reviewed, showing normal renal function.   -her diabetes is complicated by chronic heavy smoking, sedentary life and Kristine Rodriguez remains at a high risk for more acute and chronic complications which include CAD, CVA, CKD, retinopathy, and neuropathy. These are all discussed in detail with the patient.  - I have counseled her on diet management by adopting a carbohydrate restricted/protein rich diet.  -  Suggestion is made for her to avoid simple carbohydrates  from her diet including Cakes, Sweet Desserts / Pastries, Ice Cream, Soda (diet and regular), Sweet Tea, Candies, Chips, Cookies, Store Bought Juices, Alcohol in Excess of  1-2 drinks a day, Artificial Sweeteners, and "Sugar-free" Products. This will help patient to have stable blood glucose profile and potentially avoid unintended weight gain.  - I encouraged her to switch to  unprocessed or minimally processed complex starch and increased protein intake (animal or plant source), fruits, and vegetables.  - she is advised to stick to a routine mealtimes to eat 3 meals  a day and avoid unnecessary snacks ( to snack only to correct hypoglycemia).   - she is  scheduled with Jearld Fenton, RDN, CDE for individualized diabetes education.  - I have approached her with the following  individualized plan to manage diabetes and patient agrees:   -She came with improvement in her overall glycemic profile.  Based on her presentation with near target glycemia and A1c of 8.2%, she will not need prandial insulin at this time.  -Given some tight fasting glucose readings, I advised her to lower her Toujeo to 30 units nightly, associated with strict monitoring of blood glucose 2 times a day-daily before breakfast and at bedtime.    - I will continue metformin 1000 mg by mouth twice a day, therapeutically suitable for patient .  - Patient specific target  A1c;  LDL, HDL, Triglycerides, and  Waist Circumference were discussed in detail.  2) BP/HTN: Her blood pressure is controlled to target.  She is advised to continue her current blood pressure medications including Diovan HCT 160-12.5 mg p.o. daily.   3) Lipids/HPL: Her recent lipid panel showed uncontrolled hyperlipidemia with hypertriglyceridemia of 383. She is on omega-3 fatty acids given triglyceride level of 383.  She will be considered for low-dose statin therapy next visit, her fasting lipid panel.  4)  Weight/Diet: CDE Consult is been initiated , exercise, and detailed carbohydrates information provided.  5) Chronic Care/Health Maintenance:  -she  is on ARB and not on  Statin medications and  is encouraged to continue to follow up with Ophthalmology, Dentist,  Podiatrist at least yearly or according to recommendations, and advised to  quit smoking. I have recommended yearly flu vaccine and pneumonia vaccination at least every 5  years; moderate intensity exercise for up to 150 minutes weekly; and  sleep for at least 7 hours a day.  - I advised patient to maintain close follow up with Sharilyn Sites, MD for primary care needs.   - Time spent with the patient: 25 min, of which >50% was spent in reviewing her blood glucose logs , discussing her hypo- and hyper-glycemic episodes, reviewing her current and  previous labs and insulin  doses and developing a plan to avoid hypo- and hyper-glycemia. Please refer to Patient Instructions for Blood Glucose Monitoring and Insulin/Medications Dosing Guide"  in media tab for additional information. Forestine Na participated in the discussions, expressed understanding, and voiced agreement with the above plans.  All questions were answered to her satisfaction. she is encouraged to contact clinic should she have any questions or concerns prior to her return visit.   Follow up plan: - Return in about 4 months (around 09/26/2018) for Follow up with Pre-visit Labs, Meter, and Logs.  Glade Lloyd, MD [facility] [address] [place], [place] [address] Phone: [phone]  Fax: [phone]    05/27/2018, 1:25 PM  This note was partially dictated with voice recognition software. Similar sounding words can be transcribed inadequately or may not  be corrected upon review.

## 2018-05-27 NOTE — Patient Instructions (Signed)

## 2018-06-04 ENCOUNTER — Other Ambulatory Visit: Payer: Self-pay | Admitting: "Endocrinology

## 2018-06-05 ENCOUNTER — Other Ambulatory Visit: Payer: Self-pay | Admitting: "Endocrinology

## 2018-07-02 ENCOUNTER — Other Ambulatory Visit: Payer: Self-pay | Admitting: "Endocrinology

## 2018-07-26 ENCOUNTER — Other Ambulatory Visit: Payer: Self-pay | Admitting: "Endocrinology

## 2018-09-05 ENCOUNTER — Other Ambulatory Visit: Payer: Self-pay | Admitting: "Endocrinology

## 2018-09-20 ENCOUNTER — Other Ambulatory Visit: Payer: Self-pay | Admitting: "Endocrinology

## 2018-09-20 DIAGNOSIS — E1165 Type 2 diabetes mellitus with hyperglycemia: Secondary | ICD-10-CM | POA: Diagnosis not present

## 2018-09-20 DIAGNOSIS — E782 Mixed hyperlipidemia: Secondary | ICD-10-CM | POA: Diagnosis not present

## 2018-09-21 LAB — COMPREHENSIVE METABOLIC PANEL
A/G RATIO: 1.5 (ref 1.2–2.2)
ALK PHOS: 74 IU/L (ref 39–117)
ALT: 10 IU/L (ref 0–32)
AST: 10 IU/L (ref 0–40)
Albumin: 4.3 g/dL (ref 3.5–4.8)
BUN/Creatinine Ratio: 23 (ref 12–28)
BUN: 19 mg/dL (ref 8–27)
Bilirubin Total: 0.2 mg/dL (ref 0.0–1.2)
CO2: 19 mmol/L — AB (ref 20–29)
CREATININE: 0.82 mg/dL (ref 0.57–1.00)
Calcium: 10.2 mg/dL (ref 8.7–10.3)
Chloride: 100 mmol/L (ref 96–106)
GFR calc non Af Amer: 72 mL/min/{1.73_m2} (ref >59)
GFR, EST AFRICAN AMERICAN: 83 mL/min/{1.73_m2} (ref >59)
GLOBULIN, TOTAL: 2.8 g/dL (ref 1.5–4.5)
Glucose: 193 mg/dL — ABNORMAL HIGH (ref 65–99)
POTASSIUM: 4.5 mmol/L (ref 3.5–5.2)
Sodium: 139 mmol/L (ref 134–144)
TOTAL PROTEIN: 7.1 g/dL (ref 6.0–8.5)

## 2018-09-21 LAB — LIPID PANEL W/O CHOL/HDL RATIO
Cholesterol, Total: 209 mg/dL — ABNORMAL HIGH (ref 100–199)
HDL: 55 mg/dL (ref >39)
LDL Calculated: 104 mg/dL — ABNORMAL HIGH (ref 0–99)
Triglycerides: 252 mg/dL — ABNORMAL HIGH (ref 0–149)
VLDL CHOLESTEROL CAL: 50 mg/dL — AB (ref 5–40)

## 2018-09-21 LAB — HGB A1C W/O EAG: HEMOGLOBIN A1C: 8.7 % — AB (ref 4.8–5.6)

## 2018-09-21 LAB — SPECIMEN STATUS REPORT

## 2018-09-27 ENCOUNTER — Ambulatory Visit (INDEPENDENT_AMBULATORY_CARE_PROVIDER_SITE_OTHER): Payer: PPO | Admitting: "Endocrinology

## 2018-09-27 ENCOUNTER — Encounter: Payer: Self-pay | Admitting: "Endocrinology

## 2018-09-27 VITALS — BP 136/88 | HR 96 | Ht 61.0 in | Wt 138.0 lb

## 2018-09-27 DIAGNOSIS — E1165 Type 2 diabetes mellitus with hyperglycemia: Secondary | ICD-10-CM | POA: Diagnosis not present

## 2018-09-27 DIAGNOSIS — I10 Essential (primary) hypertension: Secondary | ICD-10-CM | POA: Diagnosis not present

## 2018-09-27 DIAGNOSIS — E782 Mixed hyperlipidemia: Secondary | ICD-10-CM | POA: Diagnosis not present

## 2018-09-27 NOTE — Progress Notes (Signed)
Endocrinology follow-up note       09/27/2018, 1:26 PM   Subjective:    Patient ID: Kristine Rodriguez, female    DOB: Aug 19, 1946.  she is being seen in follow-up for management of currently uncontrolled symptomatic type 2 diabetes, hypertension. PMD: Sharilyn Sites, MD.   Past Medical History:  Diagnosis Date  . Cancer ([place])    hx of breast cancer left  . Diabetes mellitus   . Hypertension    Past Surgical History:  Procedure Laterality Date  . bladder tack    . COLONOSCOPY  06/04/2012   Procedure: COLONOSCOPY;  Surgeon: Jamesetta So, MD;  Location: [facility];  Service: Gastroenterology;  Laterality: N/A;  . ESOPHAGOGASTRODUODENOSCOPY  06/04/2012   Procedure: ESOPHAGOGASTRODUODENOSCOPY (EGD);  Surgeon: Jamesetta So, MD;  Location: [facility];  Service: Gastroenterology;  Laterality: N/A;  . HERNIA REPAIR    . left mastectomy    . TONSILLECTOMY    . VESICO-VAGINAL FISTULA REPAIR     Social History   Socioeconomic History  . Marital status: Divorced    Spouse name: Not on file  . Number of children: Not on file  . Years of education: Not on file  . Highest education level: Not on file  Occupational History  . Not on file  Social Needs  . Financial resource strain: Not on file  . Food insecurity:    Worry: Not on file    Inability: Not on file  . Transportation needs:    Medical: Not on file    Non-medical: Not on file  Tobacco Use  . Smoking status: Current Every Day Smoker    Packs/day: 0.50    Types: Cigarettes  . Smokeless tobacco: Never Used  Substance and Sexual Activity  . Alcohol use: Yes    Comment: socially drink  . Drug use: No  . Sexual activity: Yes  Lifestyle  . Physical activity:    Days per week: Not on file    Minutes per session: Not on file  . Stress: Not on file  Relationships  . Social connections:    Talks on phone: Not on file    Gets  together: Not on file    Attends religious service: Not on file    Active member of club or organization: Not on file    Attends meetings of clubs or organizations: Not on file    Relationship status: Not on file  Other Topics Concern  . Not on file  Social History Narrative  . Not on file   Outpatient Encounter Medications as of 09/27/2018  Medication Sig  . albuterol (PROVENTIL) (2.5 MG/3ML) 0.083% nebulizer solution Take 2.5 mg by nebulization every 6 (six) hours as needed for wheezing or shortness of breath.  Marland Kitchen glucose blood (ONE TOUCH ULTRA TEST) test strip TEST TWICE DAILY  . Insulin Glargine (TOUJEO SOLOSTAR) 300 UNIT/ML SOPN Inject 30 Units into the skin at bedtime.  . Insulin Pen Needle (B-D ULTRAFINE III SHORT PEN) 31G X 8 MM MISC 1 each by Does not apply route at bedtime.  . metFORMIN (GLUCOPHAGE) 1000 MG tablet TAKE 1  TABLET BY MOUTH TWICE DAILY WITH FOOD  . Omega-3 Fatty Acids (FISH OIL OMEGA-3 PO) Take by mouth daily.  . valsartan-hydrochlorothiazide (DIOVAN-HCT) 160-12.5 MG tablet TAKE 1 TABLET BY MOUTH EVERY DAY  . Vitamin D, Ergocalciferol, 2000 units CAPS Take by mouth daily.  . [DISCONTINUED] Fexofenadine HCl (MUCINEX ALLERGY PO) Take by mouth.  . [DISCONTINUED] omeprazole (PRILOSEC) 20 MG capsule Take 1 capsule (20 mg total) by mouth daily.  . [DISCONTINUED] TOUJEO SOLOSTAR 300 UNIT/ML SOPN INJECT 30 UNITS INTO THE SKIN AT BEDTIME   No facility-administered encounter medications on file as of 09/27/2018.     ALLERGIES: Allergies  Allergen Reactions  . Duricef [Cefadroxil] Shortness Of Breath and Swelling  . Tape Rash    VACCINATION STATUS:  There is no immunization history on file for this patient.  Diabetes  She presents for her follow-up diabetic visit. She has type 2 diabetes mellitus. Onset time: She was diagnosed at approximate age of 72 years. Her disease course has been worsening. There are no hypoglycemic associated symptoms. Pertinent negatives for  hypoglycemia include no confusion, headaches, pallor or seizures. Associated symptoms include fatigue. Pertinent negatives for diabetes include no chest pain, no polydipsia, no polyphagia and no polyuria. There are no hypoglycemic complications. Symptoms are worsening. There are no diabetic complications. Risk factors for coronary artery disease include diabetes mellitus, dyslipidemia, family history, hypertension, sedentary lifestyle, tobacco exposure and post-menopausal. Current diabetic treatment includes oral agent (dual therapy). Her weight is increasing steadily. She is following a generally unhealthy diet. When asked about meal planning, she reported none. She has not had a previous visit with a dietitian. She participates in exercise intermittently. Her breakfast blood glucose range is generally 110-130 mg/dl. Her bedtime blood glucose range is generally 180-200 mg/dl. Her overall blood glucose range is 180-200 mg/dl. An ACE inhibitor/angiotensin II receptor blocker is being taken. She does not see a podiatrist.Eye exam is current.  Hyperlipidemia  This is a chronic problem. The current episode started more than 1 year ago. Exacerbating diseases include diabetes. She has no history of obesity. Pertinent negatives include no chest pain, myalgias or shortness of breath. Current antihyperlipidemic treatment includes fibric acid derivatives. Risk factors for coronary artery disease include diabetes mellitus, dyslipidemia, family history, hypertension and a sedentary lifestyle.  Hypertension  This is a chronic problem. The current episode started more than 1 year ago. The problem is controlled. Pertinent negatives include no chest pain, headaches, palpitations or shortness of breath. Risk factors for coronary artery disease include diabetes mellitus, dyslipidemia, smoking/tobacco exposure, sedentary lifestyle and family history. Past treatments include angiotensin blockers.    Review of Systems   Constitutional: Positive for fatigue. Negative for chills, fever and unexpected weight change.  HENT: Negative for trouble swallowing and voice change.   Eyes: Negative for visual disturbance.  Respiratory: Negative for cough, shortness of breath and wheezing.   Cardiovascular: Negative for chest pain, palpitations and leg swelling.  Gastrointestinal: Negative for diarrhea, nausea and vomiting.  Endocrine: Negative for cold intolerance, heat intolerance, polydipsia, polyphagia and polyuria.  Musculoskeletal: Negative for arthralgias and myalgias.  Skin: Negative for color change, pallor, rash and wound.  Neurological: Negative for seizures and headaches.  Psychiatric/Behavioral: Negative for confusion and suicidal ideas.    Objective:    BP 136/88   Pulse 96   Ht 5\' 1"  (1.549 m)   Wt 138 lb (62.6 kg)   BMI 26.07 kg/m   Wt Readings from Last 3 Encounters:  09/27/18 138 lb (62.6 kg)  05/27/18 137 lb (62.1 kg)  02/22/18 132 lb (59.9 kg)     Physical Exam  Constitutional: She is oriented to person, place, and time. She appears well-developed.  HENT:  Head: Normocephalic and atraumatic.  Eyes: EOM are normal.  Neck: Normal range of motion. Neck supple. No tracheal deviation present. No thyromegaly present.  Cardiovascular: Normal rate.  Pulmonary/Chest: Effort normal.  Abdominal: There is no abdominal tenderness. There is no guarding.  Musculoskeletal: Normal range of motion.        General: No edema.     Comments: Her foot exam is negative for neuropathy and PAD today.  Neurological: She is alert and oriented to person, place, and time. No cranial nerve deficit. Coordination normal.  Skin: Skin is warm and dry. No rash noted. No erythema. No pallor.  Psychiatric: She has a normal mood and affect. Judgment normal.    CMP ( most recent) CMP     Component Value Date/Time   NA 139 09/20/2018 1311   K 4.5 09/20/2018 1311   CL 100 09/20/2018 1311   CO2 19 (L) 09/20/2018  1311   GLUCOSE 193 (H) 09/20/2018 1311   GLUCOSE 141 (H) 09/16/2007 1025   BUN 19 09/20/2018 1311   CREATININE 0.82 09/20/2018 1311   CALCIUM 10.2 09/20/2018 1311   PROT 7.1 09/20/2018 1311   ALBUMIN 4.3 09/20/2018 1311   AST 10 09/20/2018 1311   ALT 10 09/20/2018 1311   ALKPHOS 74 09/20/2018 1311   BILITOT 0.2 09/20/2018 1311   GFRNONAA 72 09/20/2018 1311   GFRAA 83 09/20/2018 1311   Recent Results (from the past 2160 hour(s))  Comprehensive metabolic panel     Status: Abnormal   Collection Time: 09/20/18  1:11 PM  Result Value Ref Range   Glucose 193 (H) 65 - 99 mg/dL   BUN 19 8 - 27 mg/dL   Creatinine, Ser 0.82 0.57 - 1.00 mg/dL   GFR calc non Af Amer 72 >59 mL/min/1.73   GFR calc Af Amer 83 >59 mL/min/1.73   BUN/Creatinine Ratio 23 12 - 28   Sodium 139 134 - 144 mmol/L   Potassium 4.5 3.5 - 5.2 mmol/L   Chloride 100 96 - 106 mmol/L   CO2 19 (L) 20 - 29 mmol/L   Calcium 10.2 8.7 - 10.3 mg/dL   Total Protein 7.1 6.0 - 8.5 g/dL   Albumin 4.3 3.5 - 4.8 g/dL   Globulin, Total 2.8 1.5 - 4.5 g/dL   Albumin/Globulin Ratio 1.5 1.2 - 2.2   Bilirubin Total 0.2 0.0 - 1.2 mg/dL   Alkaline Phosphatase 74 39 - 117 IU/L   AST 10 0 - 40 IU/L   ALT 10 0 - 32 IU/L  Lipid Panel w/o Chol/HDL Ratio     Status: Abnormal   Collection Time: 09/20/18  1:11 PM  Result Value Ref Range   Cholesterol, Total 209 (H) 100 - 199 mg/dL   Triglycerides 252 (H) 0 - 149 mg/dL   HDL 55 >39 mg/dL   VLDL Cholesterol Cal 50 (H) 5 - 40 mg/dL   LDL Calculated 104 (H) 0 - 99 mg/dL  Hgb A1c w/o eAG     Status: Abnormal   Collection Time: 09/20/18  1:11 PM  Result Value Ref Range   Hgb A1c MFr Bld 8.7 (H) 4.8 - 5.6 %    Comment:          Prediabetes: 5.7 - 6.4          Diabetes: >6.4  Glycemic control for adults with diabetes: <7.0   Specimen status report     Status: None   Collection Time: 09/20/18  1:11 PM  Result Value Ref Range   specimen status report Comment     Comment: Isac Caddy  CMP14 Default Ambig Abbrev CMP14 Default A hand-written panel/profile was received from your office. In accordance with the [facility] Ambiguous Test Code Policy dated July 1610, we have completed your order by using the closest currently or formerly recognized AMA panel.  We have assigned Comprehensive Metabolic Panel (14), Test Code #322000 to this request.  If this is not the testing you wished to receive on this specimen, please contact the [facility] Client Inquiry/Technical Services Department to clarify the test order.  We appreciate your business. Ambig Abbrev LP Default Ambig Abbrev LP Default A hand-written panel/profile was received from your office. In accordance with the [facility] Ambiguous Test Code Policy dated July 9604, we have completed your order by using the closest currently or formerly recognized AMA panel.  We have assigned Lipid Panel, Test Code [id number] to this request. If this is not the testing you wished to receive on this specimen, plea se contact the [facility] Client Inquiry/Technical Services Department to clarify the test order.  We appreciate your business.      Assessment & Plan:   1. Uncontrolled type 2 diabetes mellitus with hyperglycemia ([place])  - Patient has currently uncontrolled symptomatic type 2 DM since  72 years of age. - She came with significantly improved fasting blood glucose profile, still significantly above target postprandial readings.    -She is only on basal insulin, along with her metformin.  Her previsit labs show A1c increasing to 8.7% from 8.2%.    -Her recent labs are reviewed, showing normal renal function.   -her diabetes is complicated by chronic heavy smoking, sedentary life and BRYAN OMURA remains at a high risk for more acute and chronic complications which include CAD, CVA, CKD, retinopathy, and neuropathy. These are all discussed in detail with the patient.  - I have counseled her on diet management by adopting a  carbohydrate restricted/protein rich diet.  -  Suggestion is made for her to avoid simple carbohydrates  from her diet including Cakes, Sweet Desserts / Pastries, Ice Cream, Soda (diet and regular), Sweet Tea, Candies, Chips, Cookies, Store Bought Juices, Alcohol in Excess of  1-2 drinks a day, Artificial Sweeteners, and "Sugar-free" Products. This will help patient to have stable blood glucose profile and potentially avoid unintended weight gain.   - I encouraged her to switch to  unprocessed or minimally processed complex starch and increased protein intake (animal or plant source), fruits, and vegetables.  - she is advised to stick to a routine mealtimes to eat 3 meals  a day and avoid unnecessary snacks ( to snack only to correct hypoglycemia).   - she is  scheduled with Jearld Fenton, RDN, CDE for individualized diabetes education.  - I have approached her with the following individualized plan to manage diabetes and patient agrees:   -She came with target fasting blood glucose profile, however significantly above target postprandial glycemic profile.   She would not tolerate any higher dose of Toujeo.   -She may very soon require multiple daily injections of insulin in order for her to achieve and maintain control of diabetes to target.    -For now, I advised her to stay on Toujeo 30 units nightly, associated with strict monitoring of blood glucose 2 times a  day-daily before breakfast and at bedtime.    -Normal renal function, she is advised to continue metformin 1000 mg by mouth twice a day, therapeutically suitable for patient .  - Patient specific target  A1c;  LDL, HDL, Triglycerides, and  Waist Circumference were discussed in detail.  2) BP/HTN: Her blood pressure is controlled to target.  She is advised to continue her current blood pressure medications including Diovan HCT 160-12.5 mg p.o. daily.   3) Lipids/HPL: Her recent lipid panel showed improved triglycerides to 252 from  383, LDL at 104.     She will be considered for low-dose statin therapy next visit, her fasting lipid panel.  4)  Weight/Diet: CDE Consult is been initiated , exercise, and detailed carbohydrates information provided.  5) Chronic Care/Health Maintenance:  -she  is on ARB and not on  Statin medications and  is encouraged to continue to follow up with Ophthalmology, Dentist,  Podiatrist at least yearly or according to recommendations, and advised to  quit smoking. I have recommended yearly flu vaccine and pneumonia vaccination at least every 5 years; moderate intensity exercise for up to 150 minutes weekly; and  sleep for at least 7 hours a day.  - I advised patient to maintain close follow up with Sharilyn Sites, MD for primary care needs.   - Time spent with the patient: 25 min, of which >50% was spent in reviewing her blood glucose logs , discussing her hypo- and hyper-glycemic episodes, reviewing her current and  previous labs and insulin doses and developing a plan to avoid hypo- and hyper-glycemia. Please refer to Patient Instructions for Blood Glucose Monitoring and Insulin/Medications Dosing Guide"  in media tab for additional information. Forestine Na participated in the discussions, expressed understanding, and voiced agreement with the above plans.  All questions were answered to her satisfaction. she is encouraged to contact clinic should she have any questions or concerns prior to her return visit.   Follow up plan: - Return in about 3 months (around 12/27/2018) for Follow up with Pre-visit Labs, Meter, and Logs.  Glade Lloyd, MD [facility] [address] [place], [place] [address] Phone: [phone]  Fax: [phone]    09/27/2018, 1:26 PM  This note was partially dictated with voice recognition software. Similar sounding words can be transcribed inadequately or may not  be corrected upon review.

## 2018-09-27 NOTE — Patient Instructions (Signed)

## 2018-11-03 ENCOUNTER — Other Ambulatory Visit: Payer: Self-pay | Admitting: "Endocrinology

## 2018-12-04 ENCOUNTER — Other Ambulatory Visit: Payer: Self-pay | Admitting: "Endocrinology

## 2018-12-14 ENCOUNTER — Other Ambulatory Visit: Payer: Self-pay | Admitting: "Endocrinology

## 2018-12-22 ENCOUNTER — Other Ambulatory Visit: Payer: Self-pay | Admitting: "Endocrinology

## 2018-12-22 DIAGNOSIS — E1165 Type 2 diabetes mellitus with hyperglycemia: Secondary | ICD-10-CM | POA: Diagnosis not present

## 2018-12-22 LAB — HEMOGLOBIN A1C: Hemoglobin A1C: 8.5

## 2018-12-23 LAB — COMPREHENSIVE METABOLIC PANEL
ALT: 14 IU/L (ref 0–32)
AST: 14 IU/L (ref 0–40)
Albumin/Globulin Ratio: 1.6 (ref 1.2–2.2)
Albumin: 4.1 g/dL (ref 3.7–4.7)
Alkaline Phosphatase: 94 IU/L (ref 39–117)
BUN/Creatinine Ratio: 23 (ref 12–28)
BUN: 22 mg/dL (ref 8–27)
Bilirubin Total: <0.2 mg/dL (ref 0.0–1.2)
CALCIUM: 9.8 mg/dL (ref 8.7–10.3)
CO2: 19 mmol/L — ABNORMAL LOW (ref 20–29)
Chloride: 96 mmol/L (ref 96–106)
Creatinine, Ser: 0.96 mg/dL (ref 0.57–1.00)
GFR calc Af Amer: 69 mL/min/{1.73_m2} (ref >59)
GFR, EST NON AFRICAN AMERICAN: 60 mL/min/{1.73_m2} (ref >59)
GLOBULIN, TOTAL: 2.5 g/dL (ref 1.5–4.5)
Glucose: 103 mg/dL — ABNORMAL HIGH (ref 65–99)
Potassium: 4.7 mmol/L (ref 3.5–5.2)
Sodium: 136 mmol/L (ref 134–144)
Total Protein: 6.6 g/dL (ref 6.0–8.5)

## 2018-12-23 LAB — HGB A1C W/O EAG: Hgb A1c MFr Bld: 8.5 % — ABNORMAL HIGH (ref 4.8–5.6)

## 2018-12-27 DIAGNOSIS — M9902 Segmental and somatic dysfunction of thoracic region: Secondary | ICD-10-CM | POA: Diagnosis not present

## 2018-12-27 DIAGNOSIS — M546 Pain in thoracic spine: Secondary | ICD-10-CM | POA: Diagnosis not present

## 2018-12-28 ENCOUNTER — Ambulatory Visit (INDEPENDENT_AMBULATORY_CARE_PROVIDER_SITE_OTHER): Payer: PPO | Admitting: Orthopaedic Surgery

## 2018-12-28 ENCOUNTER — Encounter: Payer: Self-pay | Admitting: "Endocrinology

## 2018-12-28 ENCOUNTER — Other Ambulatory Visit: Payer: Self-pay

## 2018-12-28 ENCOUNTER — Ambulatory Visit (INDEPENDENT_AMBULATORY_CARE_PROVIDER_SITE_OTHER): Payer: PPO

## 2018-12-28 ENCOUNTER — Encounter: Payer: Self-pay | Admitting: Orthopaedic Surgery

## 2018-12-28 ENCOUNTER — Ambulatory Visit (INDEPENDENT_AMBULATORY_CARE_PROVIDER_SITE_OTHER): Payer: PPO | Admitting: "Endocrinology

## 2018-12-28 VITALS — BP 124/82 | HR 120 | Ht 61.0 in | Wt 127.0 lb

## 2018-12-28 VITALS — HR 72 | Ht 61.0 in | Wt 125.0 lb

## 2018-12-28 DIAGNOSIS — I10 Essential (primary) hypertension: Secondary | ICD-10-CM | POA: Diagnosis not present

## 2018-12-28 DIAGNOSIS — M546 Pain in thoracic spine: Secondary | ICD-10-CM

## 2018-12-28 DIAGNOSIS — E782 Mixed hyperlipidemia: Secondary | ICD-10-CM | POA: Diagnosis not present

## 2018-12-28 DIAGNOSIS — M545 Low back pain, unspecified: Secondary | ICD-10-CM

## 2018-12-28 DIAGNOSIS — G8929 Other chronic pain: Secondary | ICD-10-CM

## 2018-12-28 DIAGNOSIS — F17200 Nicotine dependence, unspecified, uncomplicated: Secondary | ICD-10-CM | POA: Diagnosis not present

## 2018-12-28 DIAGNOSIS — E1165 Type 2 diabetes mellitus with hyperglycemia: Secondary | ICD-10-CM

## 2018-12-28 MED ORDER — HYDROCODONE-ACETAMINOPHEN 5-325 MG PO TABS: ORAL_TABLET | ORAL | 0 refills | Status: DC

## 2018-12-28 NOTE — Progress Notes (Signed)
Endocrinology follow-up note       12/28/2018, 11:41 AM   Subjective:    Patient ID: Kristine Rodriguez, female    DOB: 08/11/1946.  she is being seen in follow-up for management of currently uncontrolled symptomatic type 2 diabetes, hypertension. PMD: Sharilyn Sites, MD.   Past Medical History:  Diagnosis Date  . Cancer ([place])    hx of breast cancer left  . Diabetes mellitus   . Hypertension    Past Surgical History:  Procedure Laterality Date  . bladder tack    . COLONOSCOPY  06/04/2012   Procedure: COLONOSCOPY;  Surgeon: Jamesetta So, MD;  [facility];  Service: Gastroenterology;  Laterality: N/A;  . ESOPHAGOGASTRODUODENOSCOPY  06/04/2012   Procedure: ESOPHAGOGASTRODUODENOSCOPY (EGD);  Surgeon: Jamesetta So, MD;  Location: [facility];  Service: Gastroenterology;  Laterality: N/A;  . HERNIA REPAIR    . left mastectomy    . TONSILLECTOMY    . VESICO-VAGINAL FISTULA REPAIR     Social History   Socioeconomic History  . Marital status: Divorced    Spouse name: Not on file  . Number of children: Not on file  . Years of education: Not on file  . Highest education level: Not on file  Occupational History  . Not on file  Social Needs  . Financial resource strain: Not on file  . Food insecurity:    Worry: Not on file    Inability: Not on file  . Transportation needs:    Medical: Not on file    Non-medical: Not on file  Tobacco Use  . Smoking status: Current Every Day Smoker    Packs/day: 0.50    Types: Cigarettes  . Smokeless tobacco: Never Used  Substance and Sexual Activity  . Alcohol use: Yes    Comment: socially drink  . Drug use: No  . Sexual activity: Yes  Lifestyle  . Physical activity:    Days per week: Not on file    Minutes per session: Not on file  . Stress: Not on file  Relationships  . Social connections:    Talks on phone: Not on file    Gets together:  Not on file    Attends religious service: Not on file    Active member of club or organization: Not on file    Attends meetings of clubs or organizations: Not on file    Relationship status: Not on file  Other Topics Concern  . Not on file  Social History Narrative  . Not on file   Outpatient Encounter Medications as of 12/28/2018  Medication Sig  . Insulin Glargine, 1 Unit Dial, 300 UNIT/ML SOPN Inject 30 Units into the skin daily with breakfast.  . albuterol (PROVENTIL) (2.5 MG/3ML) 0.083% nebulizer solution Take 2.5 mg by nebulization every 6 (six) hours as needed for wheezing or shortness of breath.  . B-D ULTRAFINE III SHORT PEN 31G X 8 MM MISC USE ONCE AT BEDTIME  . glucose blood (ONE TOUCH ULTRA TEST) test strip TEST TWICE DAILY  . HYDROcodone-acetaminophen (NORCO/VICODIN) 5-325 MG tablet One tablet every four hours as needed for  acute pain.  Limit of five days per [place].  . metFORMIN (GLUCOPHAGE) 1000 MG tablet TAKE 1 TABLET BY MOUTH TWICE DAILY WITH FOOD  . Omega-3 Fatty Acids (FISH OIL OMEGA-3 PO) Take by mouth daily.  . valsartan-hydrochlorothiazide (DIOVAN-HCT) 160-12.5 MG tablet TAKE 1 TABLET BY MOUTH EVERY DAY  . Vitamin D, Ergocalciferol, 2000 units CAPS Take by mouth daily.  . [DISCONTINUED] Insulin Glargine (TOUJEO SOLOSTAR) 300 UNIT/ML SOPN Inject 30 Units into the skin at bedtime.   No facility-administered encounter medications on file as of 12/28/2018.     ALLERGIES: Allergies  Allergen Reactions  . Duricef [Cefadroxil] Shortness Of Breath and Swelling  . Tape Rash    VACCINATION STATUS:  There is no immunization history on file for this patient.  Diabetes  She presents for her follow-up diabetic visit. She has type 2 diabetes mellitus. Onset time: She was diagnosed at approximate age of 78 years. Her disease course has been worsening. There are no hypoglycemic associated symptoms. Pertinent negatives for hypoglycemia include no confusion, headaches,  pallor or seizures. Associated symptoms include fatigue. Pertinent negatives for diabetes include no chest pain, no polydipsia, no polyphagia and no polyuria. There are no hypoglycemic complications. Symptoms are worsening. There are no diabetic complications. Risk factors for coronary artery disease include diabetes mellitus, dyslipidemia, family history, hypertension, sedentary lifestyle, tobacco exposure and post-menopausal. Current diabetic treatment includes oral agent (dual therapy). Her weight is increasing steadily. She is following a generally unhealthy diet. When asked about meal planning, she reported none. She has not had a previous visit with a dietitian. She participates in exercise intermittently. Her breakfast blood glucose range is generally 110-130 mg/dl. Her bedtime blood glucose range is generally 180-200 mg/dl. Her overall blood glucose range is 180-200 mg/dl. An ACE inhibitor/angiotensin II receptor blocker is being taken. She does not see a podiatrist.Eye exam is current.  Hyperlipidemia  This is a chronic problem. The current episode started more than 1 year ago. Exacerbating diseases include diabetes. She has no history of obesity. Pertinent negatives include no chest pain, myalgias or shortness of breath. Current antihyperlipidemic treatment includes fibric acid derivatives. Risk factors for coronary artery disease include diabetes mellitus, dyslipidemia, family history, hypertension and a sedentary lifestyle.  Hypertension  This is a chronic problem. The current episode started more than 1 year ago. The problem is controlled. Pertinent negatives include no chest pain, headaches, palpitations or shortness of breath. Risk factors for coronary artery disease include diabetes mellitus, dyslipidemia, smoking/tobacco exposure, sedentary lifestyle and family history. Past treatments include angiotensin blockers.    Review of Systems  Constitutional: Positive for fatigue. Negative for  chills, fever and unexpected weight change.  HENT: Negative for trouble swallowing and voice change.   Eyes: Negative for visual disturbance.  Respiratory: Negative for cough, shortness of breath and wheezing.   Cardiovascular: Negative for chest pain, palpitations and leg swelling.  Gastrointestinal: Negative for diarrhea, nausea and vomiting.  Endocrine: Negative for cold intolerance, heat intolerance, polydipsia, polyphagia and polyuria.  Musculoskeletal: Negative for arthralgias and myalgias.  Skin: Negative for color change, pallor, rash and wound.  Neurological: Negative for seizures and headaches.  Psychiatric/Behavioral: Negative for confusion and suicidal ideas.    Objective:    Pulse 72   Ht 5\' 1"  (1.549 m)   Wt 125 lb (56.7 kg)   BMI 23.62 kg/m   Wt Readings from Last 3 Encounters:  12/28/18 125 lb (56.7 kg)  12/28/18 127 lb (57.6 kg)  09/27/18 138 lb (  62.6 kg)     Physical Exam  Constitutional: She is oriented to person, place, and time. She appears well-developed.  HENT:  Head: Normocephalic and atraumatic.  Eyes: EOM are normal.  Neck: Normal range of motion. Neck supple. No tracheal deviation present. No thyromegaly present.  Cardiovascular: Normal rate.  Pulmonary/Chest: Effort normal.  Abdominal: There is no abdominal tenderness. There is no guarding.  Musculoskeletal: Normal range of motion.        General: No edema.     Comments: Her foot exam is negative for neuropathy and PAD today.  Neurological: She is alert and oriented to person, place, and time. No cranial nerve deficit. Coordination normal.  Skin: Skin is warm and dry. No rash noted. No erythema. No pallor.  Psychiatric: She has a normal mood and affect. Judgment normal.    CMP ( most recent) CMP     Component Value Date/Time   NA 139 09/20/2018 1311   K 4.5 09/20/2018 1311   CL 100 09/20/2018 1311   CO2 19 (L) 09/20/2018 1311   GLUCOSE 193 (H) 09/20/2018 1311   GLUCOSE 141 (H) 09/16/2007  1025   BUN 19 09/20/2018 1311   CREATININE 0.82 09/20/2018 1311   CALCIUM 10.2 09/20/2018 1311   PROT 7.1 09/20/2018 1311   ALBUMIN 4.3 09/20/2018 1311   AST 10 09/20/2018 1311   ALT 10 09/20/2018 1311   ALKPHOS 74 09/20/2018 1311   BILITOT 0.2 09/20/2018 1311   GFRNONAA 72 09/20/2018 1311   GFRAA 83 09/20/2018 1311   Recent Results (from the past 2160 hour(s))  Hemoglobin A1c     Status: None   Collection Time: 12/22/18 12:00 AM  Result Value Ref Range   Hemoglobin A1C 8.5      Assessment & Plan:   1. Uncontrolled type 2 diabetes mellitus with hyperglycemia ([place])  - Patient has currently uncontrolled symptomatic type 2 DM since  73 years of age. - She came with tightly controlled fasting blood glucose profile, still significantly above target postprandial glucose readings and A1c of 8.5%.   -Her recent labs are reviewed, showing normal renal function.   -her diabetes is complicated by chronic heavy smoking, sedentary life and ELYSHA DAW remains at a high risk for more acute and chronic complications which include CAD, CVA, CKD, retinopathy, and neuropathy. These are all discussed in detail with the patient.  - I have counseled her on diet management by adopting a carbohydrate restricted/protein rich diet.  - Patient admits there is a room for improvement in her diet and drink choices. -  Suggestion is made for her to avoid simple carbohydrates  from her diet including Cakes, Sweet Desserts / Pastries, Ice Cream, Soda (diet and regular), Sweet Tea, Candies, Chips, Cookies, Store Bought Juices, Alcohol in Excess of  1-2 drinks a day, Artificial Sweeteners, and "Sugar-free" Products. This will help patient to have stable blood glucose profile and potentially avoid unintended weight gain.   - I encouraged her to switch to  unprocessed or minimally processed complex starch and increased protein intake (animal or plant source), fruits, and vegetables.  - she is advised to stick  to a routine mealtimes to eat 3 meals  a day and avoid unnecessary snacks ( to snack only to correct hypoglycemia).   - she is  scheduled with Jearld Fenton, RDN, CDE for individualized diabetes education.  - I have approached her with the following individualized plan to manage diabetes and patient agrees:   -She came with  tight control of fasting blood glucose profile, A1c still above target at 8.5%.  She will not need prandial insulin for now. -She is advised to switch her Toujeo to daily with breakfast at 30 units, continue to monitor blood glucose at least 2 times a day-daily before breakfast and at bedtime.    - she has had normal renal function, she is advised to continue metformin 1000 mg by mouth twice a day, therapeutically suitable for patient .  - Patient specific target  A1c;  LDL, HDL, Triglycerides, and  Waist Circumference were discussed in detail.  2) BP/HTN: She does not allow blood pressure measurement this morning.    She is advised to continue her current blood pressure medications including Diovan HCT 160-12.5 mg p.o. daily.   3) Lipids/HPL: Her recent lipid panel showed improved triglycerides to 252 from 383, LDL at 104.     She will be considered for low-dose statin therapy next visit, her fasting lipid panel.  4)  Weight/Diet: She lost weight unintentionally.  She is not a candidate for weight loss.  CDE Consult is been initiated , exercise, and detailed carbohydrates information provided.  5) Chronic Care/Health Maintenance:  -she  is on ARB and not on  Statin medications and  is encouraged to continue to follow up with Ophthalmology, Dentist,  Podiatrist at least yearly or according to recommendations, and advised to  quit smoking. I have recommended yearly flu vaccine and pneumonia vaccination at least every 5 years; moderate intensity exercise for up to 150 minutes weekly; and  sleep for at least 7 hours a day.  - I advised patient to maintain close follow up  with Sharilyn Sites, MD for primary care needs.   - Time spent with the patient: 25 min, of which >50% was spent in reviewing her blood glucose logs , discussing her hypoglycemia and hyperglycemia episodes, reviewing her current and  previous labs / studies and medications  doses and developing a plan to avoid hypoglycemia and hyperglycemia. Please refer to Patient Instructions for Blood Glucose Monitoring and Insulin/Medications Dosing Guide"  in media tab for additional information. Please  also refer to " Patient Self Inventory" in the Media  tab for reviewed elements of pertinent patient history.  Delray Alt participated in the discussions, expressed understanding, and voiced agreement with the above plans.  All questions were answered to her satisfaction. she is encouraged to contact clinic should she have any questions or concerns prior to her return visit.    Follow up plan: - Return in about 3 months (around 03/30/2019) for Meter, and Logs, Follow up with Pre-visit Labs, Meter, and Logs.  Glade Lloyd, MD [facility] [address] [place], [place] [address] Phone: [phone]  Fax: [phone]    12/28/2018, 11:41 AM  This note was partially dictated with voice recognition software. Similar sounding words can be transcribed inadequately or may not  be corrected upon review.

## 2018-12-28 NOTE — Patient Instructions (Signed)

## 2018-12-28 NOTE — Progress Notes (Signed)
Subjective:    Patient ID: Kristine Rodriguez, female    DOB: 1946/03/15, 73 y.o.   MRN: [id number]  HPI She has had lower back pain since December.  Her son picked her up and she has had back pain since then. She has tried ice, heat, rest, Tylenol with no help. She saw Dr. Carlis Abbott last week and he suggested she come here.  She has no numbness. She has localized pain that is getting worse. She has no weakness.   Review of Systems  Constitutional: Positive for activity change.  Musculoskeletal: Positive for arthralgias and back pain.  All other systems reviewed and are negative.  For Review of Systems, all other systems reviewed and are negative.  The following is a summary of the past history medically, past history surgically, known current medicines, social history and family history.  This information is gathered electronically by the computer from prior information and documentation.  I review this each visit and have found including this information at this point in the chart is beneficial and informative.   Past Medical History:  Diagnosis Date  . Cancer ([place])    hx of breast cancer left  . Diabetes mellitus   . Hypertension     Past Surgical History:  Procedure Laterality Date  . bladder tack    . COLONOSCOPY  06/04/2012   Procedure: COLONOSCOPY;  Surgeon: Jamesetta So, MD;  [facility];  Service: Gastroenterology;  Laterality: N/A;  . ESOPHAGOGASTRODUODENOSCOPY  06/04/2012   Procedure: ESOPHAGOGASTRODUODENOSCOPY (EGD);  Surgeon: Jamesetta So, MD;  [facility];  Service: Gastroenterology;  Laterality: N/A;  . HERNIA REPAIR    . left mastectomy    . TONSILLECTOMY    . VESICO-VAGINAL FISTULA REPAIR      Current Outpatient Medications on File Prior to Visit  Medication Sig Dispense Refill  . albuterol (PROVENTIL) (2.5 MG/3ML) 0.083% nebulizer solution Take 2.5 mg by nebulization every 6 (six) hours as needed for wheezing or shortness of breath.    .  B-D ULTRAFINE III SHORT PEN 31G X 8 MM MISC USE ONCE AT BEDTIME 100 each 3  . glucose blood (ONE TOUCH ULTRA TEST) test strip TEST TWICE DAILY 100 each 5  . Insulin Glargine (TOUJEO SOLOSTAR) 300 UNIT/ML SOPN Inject 30 Units into the skin at bedtime. 4.5 mL 2  . metFORMIN (GLUCOPHAGE) 1000 MG tablet TAKE 1 TABLET BY MOUTH TWICE DAILY WITH FOOD 60 tablet 2  . Omega-3 Fatty Acids (FISH OIL OMEGA-3 PO) Take by mouth daily.    . valsartan-hydrochlorothiazide (DIOVAN-HCT) 160-12.5 MG tablet TAKE 1 TABLET BY MOUTH EVERY DAY 30 tablet 2  . Vitamin D, Ergocalciferol, 2000 units CAPS Take by mouth daily.     No current facility-administered medications on file prior to visit.     Social History   Socioeconomic History  . Marital status: Divorced    Spouse name: Not on file  . Number of children: Not on file  . Years of education: Not on file  . Highest education level: Not on file  Occupational History  . Not on file  Social Needs  . Financial resource strain: Not on file  . Food insecurity:    Worry: Not on file    Inability: Not on file  . Transportation needs:    Medical: Not on file    Non-medical: Not on file  Tobacco Use  . Smoking status: Current Every Day Smoker    Packs/day: 0.50  Types: Cigarettes  . Smokeless tobacco: Never Used  Substance and Sexual Activity  . Alcohol use: Yes    Comment: socially drink  . Drug use: No  . Sexual activity: Yes  Lifestyle  . Physical activity:    Days per week: Not on file    Minutes per session: Not on file  . Stress: Not on file  Relationships  . Social connections:    Talks on phone: Not on file    Gets together: Not on file    Attends religious service: Not on file    Active member of club or organization: Not on file    Attends meetings of clubs or organizations: Not on file    Relationship status: Not on file  . Intimate partner violence:    Fear of current or ex partner: Not on file    Emotionally abused: Not on file     Physically abused: Not on file    Forced sexual activity: Not on file  Other Topics Concern  . Not on file  Social History Narrative  . Not on file    Family History  Problem Relation Age of Onset  . Hypertension Mother   . Diabetes Father   . Hypertension Father     BP 124/82   Pulse (!) 120   Ht 5\' 1"  (1.549 m)   Wt 127 lb (57.6 kg)   BMI 24.00 kg/m   Body mass index is 24 kg/m.     Objective:   Physical Exam Constitutional:      Appearance: She is well-developed.  HENT:     Head: Normocephalic and atraumatic.  Eyes:     Conjunctiva/sclera: Conjunctivae normal.     Pupils: Pupils are equal, round, and reactive to light.  Neck:     Musculoskeletal: Normal range of motion and neck supple.  Cardiovascular:     Rate and Rhythm: Normal rate and regular rhythm.  Pulmonary:     Effort: Pulmonary effort is normal.  Abdominal:     Palpations: Abdomen is soft.  Musculoskeletal:       Back:  Skin:    General: Skin is warm and dry.  Neurological:     Mental Status: She is alert and oriented to person, place, and time.     Cranial Nerves: No cranial nerve deficit.     Motor: No abnormal muscle tone.     Coordination: Coordination normal.     Deep Tendon Reflexes: Reflexes are normal and symmetric. Reflexes normal.  Psychiatric:        Behavior: Behavior normal.        Thought Content: Thought content normal.        Judgment: Judgment normal.      X-rays were done of lumbar spine, reported separately. X-rays were done also of the thoracic spine.  I am concerned about possible compression fracture.  I will get MRI.     Assessment & Plan:   Encounter Diagnoses  Name Primary?  . Chronic bilateral low back pain without sciatica Yes  . Acute midline thoracic back pain    To have MRI of the lumbar and thoracic spine  Pain medicine is called in.  Return after MRI.  Call if any problem.  Precautions discussed.   Electronically Signed Sanjuana Kava,  MD 3/17/20209:27 AM

## 2019-01-07 ENCOUNTER — Telehealth: Payer: Self-pay | Admitting: Orthopaedic Surgery

## 2019-01-07 NOTE — Telephone Encounter (Signed)
Patient requests refill: HYDROcodone-acetaminophen (NORCO/VICODIN) 5-325 MG tablet 30 tablet 0   - [facility], [address], [place]      - Also relays MRI has been re-scheduled to 01/31/19 due to covid-19 restrictions.  Shall we schedule follow up visit after this date?

## 2019-01-10 MED ORDER — HYDROCODONE-ACETAMINOPHEN 5-325 MG PO TABS: ORAL_TABLET | ORAL | 0 refills | Status: DC

## 2019-01-10 NOTE — Telephone Encounter (Signed)
Called back to patient; aware of refill, and also of scheduling appointment after MRI, once scheduling of MRI resumes.

## 2019-01-24 DIAGNOSIS — J449 Chronic obstructive pulmonary disease, unspecified: Secondary | ICD-10-CM | POA: Diagnosis not present

## 2019-01-24 DIAGNOSIS — Z20828 Contact with and (suspected) exposure to other viral communicable diseases: Secondary | ICD-10-CM | POA: Diagnosis not present

## 2019-01-24 DIAGNOSIS — E1165 Type 2 diabetes mellitus with hyperglycemia: Secondary | ICD-10-CM | POA: Diagnosis not present

## 2019-01-24 DIAGNOSIS — J441 Chronic obstructive pulmonary disease with (acute) exacerbation: Secondary | ICD-10-CM | POA: Diagnosis not present

## 2019-01-24 DIAGNOSIS — J9621 Acute and chronic respiratory failure with hypoxia: Secondary | ICD-10-CM | POA: Diagnosis not present

## 2019-01-24 DIAGNOSIS — Z681 Body mass index (BMI) 19 or less, adult: Secondary | ICD-10-CM | POA: Diagnosis not present

## 2019-01-24 DIAGNOSIS — Z0001 Encounter for general adult medical examination with abnormal findings: Secondary | ICD-10-CM | POA: Diagnosis not present

## 2019-01-24 DIAGNOSIS — I10 Essential (primary) hypertension: Secondary | ICD-10-CM | POA: Diagnosis not present

## 2019-01-24 DIAGNOSIS — F10232 Alcohol dependence with withdrawal with perceptual disturbance: Secondary | ICD-10-CM | POA: Diagnosis not present

## 2019-01-24 DIAGNOSIS — R7881 Bacteremia: Secondary | ICD-10-CM | POA: Diagnosis not present

## 2019-01-24 DIAGNOSIS — J849 Interstitial pulmonary disease, unspecified: Secondary | ICD-10-CM | POA: Diagnosis not present

## 2019-01-24 DIAGNOSIS — R300 Dysuria: Secondary | ICD-10-CM | POA: Diagnosis not present

## 2019-01-25 ENCOUNTER — Telehealth: Payer: Self-pay | Admitting: Orthopaedic Surgery

## 2019-01-29 ENCOUNTER — Other Ambulatory Visit: Payer: Self-pay | Admitting: "Endocrinology

## 2019-01-31 ENCOUNTER — Ambulatory Visit (HOSPITAL_COMMUNITY): Payer: PPO

## 2019-01-31 ENCOUNTER — Encounter: Payer: Self-pay | Admitting: "Endocrinology

## 2019-02-04 ENCOUNTER — Encounter (HOSPITAL_COMMUNITY): Payer: Self-pay | Admitting: Emergency Medicine

## 2019-02-04 ENCOUNTER — Other Ambulatory Visit: Payer: Self-pay

## 2019-02-04 ENCOUNTER — Inpatient Hospital Stay (HOSPITAL_COMMUNITY)
Admission: EM | Admit: 2019-02-04 | Discharge: 2019-02-08 | DRG: 872 | Disposition: A | Discharge status: Home / Self Care | Payer: PPO | Attending: Internal Medicine | Admitting: Internal Medicine

## 2019-02-04 ENCOUNTER — Emergency Department (HOSPITAL_COMMUNITY): Payer: PPO

## 2019-02-04 DIAGNOSIS — N200 Calculus of kidney: Secondary | ICD-10-CM | POA: Diagnosis not present

## 2019-02-04 DIAGNOSIS — Y92009 Unspecified place in unspecified non-institutional (private) residence as the place of occurrence of the external cause: Secondary | ICD-10-CM

## 2019-02-04 DIAGNOSIS — G8929 Other chronic pain: Secondary | ICD-10-CM | POA: Diagnosis present

## 2019-02-04 DIAGNOSIS — C252 Malignant neoplasm of tail of pancreas: Secondary | ICD-10-CM | POA: Diagnosis not present

## 2019-02-04 DIAGNOSIS — M546 Pain in thoracic spine: Secondary | ICD-10-CM | POA: Diagnosis not present

## 2019-02-04 DIAGNOSIS — E872 Acidosis, unspecified: Secondary | ICD-10-CM | POA: Diagnosis present

## 2019-02-04 DIAGNOSIS — I82409 Acute embolism and thrombosis of unspecified deep veins of unspecified lower extremity: Secondary | ICD-10-CM | POA: Diagnosis not present

## 2019-02-04 DIAGNOSIS — C259 Malignant neoplasm of pancreas, unspecified: Secondary | ICD-10-CM | POA: Diagnosis not present

## 2019-02-04 DIAGNOSIS — Z9221 Personal history of antineoplastic chemotherapy: Secondary | ICD-10-CM | POA: Diagnosis not present

## 2019-02-04 DIAGNOSIS — E860 Dehydration: Secondary | ICD-10-CM | POA: Diagnosis present

## 2019-02-04 DIAGNOSIS — C787 Secondary malignant neoplasm of liver and intrahepatic bile duct: Secondary | ICD-10-CM | POA: Diagnosis not present

## 2019-02-04 DIAGNOSIS — E119 Type 2 diabetes mellitus without complications: Secondary | ICD-10-CM | POA: Diagnosis not present

## 2019-02-04 DIAGNOSIS — M955 Acquired deformity of pelvis: Secondary | ICD-10-CM | POA: Diagnosis not present

## 2019-02-04 DIAGNOSIS — T502X5A Adverse effect of carbonic-anhydrase inhibitors, benzothiadiazides and other diuretics, initial encounter: Secondary | ICD-10-CM | POA: Diagnosis present

## 2019-02-04 DIAGNOSIS — K769 Liver disease, unspecified: Secondary | ICD-10-CM | POA: Diagnosis not present

## 2019-02-04 DIAGNOSIS — J449 Chronic obstructive pulmonary disease, unspecified: Secondary | ICD-10-CM | POA: Diagnosis present

## 2019-02-04 DIAGNOSIS — E1165 Type 2 diabetes mellitus with hyperglycemia: Secondary | ICD-10-CM | POA: Diagnosis present

## 2019-02-04 DIAGNOSIS — N179 Acute kidney failure, unspecified: Secondary | ICD-10-CM | POA: Diagnosis not present

## 2019-02-04 DIAGNOSIS — R918 Other nonspecific abnormal finding of lung field: Secondary | ICD-10-CM | POA: Diagnosis not present

## 2019-02-04 DIAGNOSIS — R634 Abnormal weight loss: Secondary | ICD-10-CM | POA: Diagnosis not present

## 2019-02-04 DIAGNOSIS — Z6823 Body mass index (BMI) 23.0-23.9, adult: Secondary | ICD-10-CM | POA: Diagnosis not present

## 2019-02-04 DIAGNOSIS — Z853 Personal history of malignant neoplasm of breast: Secondary | ICD-10-CM | POA: Diagnosis not present

## 2019-02-04 DIAGNOSIS — Z800 Family history of malignant neoplasm of digestive organs: Secondary | ICD-10-CM | POA: Diagnosis not present

## 2019-02-04 DIAGNOSIS — K8689 Other specified diseases of pancreas: Secondary | ICD-10-CM | POA: Diagnosis not present

## 2019-02-04 DIAGNOSIS — M545 Low back pain: Secondary | ICD-10-CM | POA: Diagnosis not present

## 2019-02-04 DIAGNOSIS — A419 Sepsis, unspecified organism: Secondary | ICD-10-CM | POA: Diagnosis not present

## 2019-02-04 DIAGNOSIS — R590 Localized enlarged lymph nodes: Secondary | ICD-10-CM | POA: Diagnosis not present

## 2019-02-04 DIAGNOSIS — C251 Malignant neoplasm of body of pancreas: Secondary | ICD-10-CM | POA: Diagnosis present

## 2019-02-04 DIAGNOSIS — Z888 Allergy status to other drugs, medicaments and biological substances status: Secondary | ICD-10-CM

## 2019-02-04 DIAGNOSIS — Z881 Allergy status to other antibiotic agents status: Secondary | ICD-10-CM

## 2019-02-04 DIAGNOSIS — Z833 Family history of diabetes mellitus: Secondary | ICD-10-CM

## 2019-02-04 DIAGNOSIS — K649 Unspecified hemorrhoids: Secondary | ICD-10-CM | POA: Diagnosis present

## 2019-02-04 DIAGNOSIS — Z91048 Other nonmedicinal substance allergy status: Secondary | ICD-10-CM

## 2019-02-04 DIAGNOSIS — Z720 Tobacco use: Secondary | ICD-10-CM | POA: Diagnosis present

## 2019-02-04 DIAGNOSIS — Z8249 Family history of ischemic heart disease and other diseases of the circulatory system: Secondary | ICD-10-CM

## 2019-02-04 DIAGNOSIS — M549 Dorsalgia, unspecified: Secondary | ICD-10-CM | POA: Diagnosis present

## 2019-02-04 DIAGNOSIS — E782 Mixed hyperlipidemia: Secondary | ICD-10-CM | POA: Diagnosis present

## 2019-02-04 DIAGNOSIS — B9620 Unspecified Escherichia coli [E. coli] as the cause of diseases classified elsewhere: Secondary | ICD-10-CM | POA: Diagnosis not present

## 2019-02-04 DIAGNOSIS — N390 Urinary tract infection, site not specified: Secondary | ICD-10-CM | POA: Diagnosis not present

## 2019-02-04 DIAGNOSIS — E11649 Type 2 diabetes mellitus with hypoglycemia without coma: Secondary | ICD-10-CM | POA: Diagnosis not present

## 2019-02-04 DIAGNOSIS — F17210 Nicotine dependence, cigarettes, uncomplicated: Secondary | ICD-10-CM | POA: Diagnosis present

## 2019-02-04 DIAGNOSIS — I10 Essential (primary) hypertension: Secondary | ICD-10-CM | POA: Diagnosis present

## 2019-02-04 DIAGNOSIS — Z923 Personal history of irradiation: Secondary | ICD-10-CM | POA: Diagnosis not present

## 2019-02-04 DIAGNOSIS — Z9012 Acquired absence of left breast and nipple: Secondary | ICD-10-CM

## 2019-02-04 DIAGNOSIS — K7689 Other specified diseases of liver: Secondary | ICD-10-CM | POA: Diagnosis not present

## 2019-02-04 DIAGNOSIS — E871 Hypo-osmolality and hyponatremia: Secondary | ICD-10-CM | POA: Diagnosis not present

## 2019-02-04 DIAGNOSIS — E46 Unspecified protein-calorie malnutrition: Secondary | ICD-10-CM | POA: Diagnosis not present

## 2019-02-04 DIAGNOSIS — K5730 Diverticulosis of large intestine without perforation or abscess without bleeding: Secondary | ICD-10-CM | POA: Diagnosis not present

## 2019-02-04 DIAGNOSIS — J439 Emphysema, unspecified: Secondary | ICD-10-CM | POA: Diagnosis not present

## 2019-02-04 DIAGNOSIS — K8020 Calculus of gallbladder without cholecystitis without obstruction: Secondary | ICD-10-CM | POA: Diagnosis not present

## 2019-02-04 DIAGNOSIS — R319 Hematuria, unspecified: Secondary | ICD-10-CM

## 2019-02-04 DIAGNOSIS — M5126 Other intervertebral disc displacement, lumbar region: Secondary | ICD-10-CM | POA: Diagnosis not present

## 2019-02-04 DIAGNOSIS — C229 Malignant neoplasm of liver, not specified as primary or secondary: Secondary | ICD-10-CM | POA: Diagnosis not present

## 2019-02-04 DIAGNOSIS — Z794 Long term (current) use of insulin: Secondary | ICD-10-CM

## 2019-02-04 DIAGNOSIS — Z808 Family history of malignant neoplasm of other organs or systems: Secondary | ICD-10-CM | POA: Diagnosis not present

## 2019-02-04 DIAGNOSIS — E8770 Fluid overload, unspecified: Secondary | ICD-10-CM | POA: Diagnosis not present

## 2019-02-04 DIAGNOSIS — N3001 Acute cystitis with hematuria: Secondary | ICD-10-CM | POA: Diagnosis not present

## 2019-02-04 DIAGNOSIS — R978 Other abnormal tumor markers: Secondary | ICD-10-CM | POA: Diagnosis not present

## 2019-02-04 DIAGNOSIS — Z79899 Other long term (current) drug therapy: Secondary | ICD-10-CM

## 2019-02-04 HISTORY — DX: Tobacco use: Z72.0

## 2019-02-04 LAB — COMPREHENSIVE METABOLIC PANEL
ALT: 18 U/L (ref 0–44)
AST: 15 U/L (ref 15–41)
Albumin: 4.1 g/dL (ref 3.5–5.0)
Alkaline Phosphatase: 84 U/L (ref 38–126)
Anion gap: 19 — ABNORMAL HIGH (ref 5–15)
BUN: 93 mg/dL — ABNORMAL HIGH (ref 8–23)
CO2: 17 mmol/L — ABNORMAL LOW (ref 22–32)
Calcium: 9.6 mg/dL (ref 8.9–10.3)
Chloride: 95 mmol/L — ABNORMAL LOW (ref 98–111)
Creatinine, Ser: 2.59 mg/dL — ABNORMAL HIGH (ref 0.44–1.00)
GFR calc Af Amer: 21 mL/min — ABNORMAL LOW (ref >60)
GFR calc non Af Amer: 18 mL/min — ABNORMAL LOW (ref >60)
Glucose, Bld: 245 mg/dL — ABNORMAL HIGH (ref 70–99)
Potassium: 4.1 mmol/L (ref 3.5–5.1)
Sodium: 131 mmol/L — ABNORMAL LOW (ref 135–145)
Total Bilirubin: 0.5 mg/dL (ref 0.3–1.2)
Total Protein: 7.8 g/dL (ref 6.5–8.1)

## 2019-02-04 LAB — URINALYSIS, ROUTINE W REFLEX MICROSCOPIC
Bilirubin Urine: NEGATIVE
Glucose, UA: NEGATIVE mg/dL
Ketones, ur: 5 mg/dL — AB
Nitrite: NEGATIVE
Protein, ur: 100 mg/dL — AB
Specific Gravity, Urine: 1.015 (ref 1.005–1.030)
WBC, UA: >50 WBC/hpf — ABNORMAL HIGH (ref 0–5)
pH: 5 (ref 5.0–8.0)

## 2019-02-04 LAB — CBC
HCT: 43.2 % (ref 36.0–46.0)
Hemoglobin: 14 g/dL (ref 12.0–15.0)
MCH: 29.2 pg (ref 26.0–34.0)
MCHC: 32.4 g/dL (ref 30.0–36.0)
MCV: 90 fL (ref 80.0–100.0)
Platelets: 407 10*3/uL — ABNORMAL HIGH (ref 150–400)
RBC: 4.8 MIL/uL (ref 3.87–5.11)
RDW: 14.6 % (ref 11.5–15.5)
WBC: 13.2 10*3/uL — ABNORMAL HIGH (ref 4.0–10.5)
nRBC: 0 % (ref 0.0–0.2)

## 2019-02-04 LAB — LIPASE, BLOOD: Lipase: 33 U/L (ref 11–51)

## 2019-02-04 LAB — LACTIC ACID, PLASMA: Lactic Acid, Venous: 2.5 mmol/L (ref 0.5–1.9)

## 2019-02-04 LAB — PROTIME-INR
INR: 1.1 (ref 0.8–1.2)
Prothrombin Time: 14.1 seconds (ref 11.4–15.2)

## 2019-02-04 LAB — CBG MONITORING, ED: Glucose-Capillary: 218 mg/dL — ABNORMAL HIGH (ref 70–99)

## 2019-02-04 MED ORDER — ACETAMINOPHEN 650 MG RE SUPP: 650.0000 mg | Freq: Four times a day (QID) | RECTAL | Status: DC | PRN

## 2019-02-04 MED ORDER — LEVOFLOXACIN IN D5W 500 MG/100ML IV SOLN: 500.0000 mg | INTRAVENOUS | Status: DC

## 2019-02-04 MED ORDER — ENOXAPARIN SODIUM 30 MG/0.3ML ~~LOC~~ SOLN
30.0000 mg | SUBCUTANEOUS | Status: DC
  Administered 2019-02-04: 23:00:00 30 mg via SUBCUTANEOUS
  Filled 2019-02-04: qty 0.3

## 2019-02-04 MED ORDER — ONDANSETRON HCL 4 MG PO TABS
4.0000 mg | ORAL_TABLET | Freq: Four times a day (QID) | ORAL | Status: DC | PRN
  Administered 2019-02-06: 16:00:00 4 mg via ORAL
  Filled 2019-02-04: qty 1

## 2019-02-04 MED ORDER — ONDANSETRON HCL 4 MG/2ML IJ SOLN
4.0000 mg | Freq: Once | INTRAMUSCULAR | Status: AC
  Administered 2019-02-04: 23:00:00 4 mg via INTRAVENOUS
  Filled 2019-02-04: qty 2

## 2019-02-04 MED ORDER — FENTANYL CITRATE (PF) 100 MCG/2ML IJ SOLN
INTRAMUSCULAR | Status: AC
  Filled 2019-02-04: qty 2

## 2019-02-04 MED ORDER — FENTANYL CITRATE (PF) 100 MCG/2ML IJ SOLN
50.0000 ug | Freq: Once | INTRAMUSCULAR | Status: AC
  Administered 2019-02-04: 50 ug via INTRAVENOUS

## 2019-02-04 MED ORDER — ONDANSETRON HCL 4 MG/2ML IJ SOLN
4.0000 mg | Freq: Four times a day (QID) | INTRAMUSCULAR | Status: DC | PRN
  Administered 2019-02-04 – 2019-02-08 (×5): 4 mg via INTRAVENOUS
  Filled 2019-02-04 (×5): qty 2

## 2019-02-04 MED ORDER — ACETAMINOPHEN 325 MG PO TABS: 650.0000 mg | ORAL_TABLET | Freq: Four times a day (QID) | ORAL | Status: DC | PRN

## 2019-02-04 MED ORDER — INSULIN ASPART 100 UNIT/ML ~~LOC~~ SOLN
0.0000 [IU] | Freq: Three times a day (TID) | SUBCUTANEOUS | Status: DC
  Administered 2019-02-05: 12:00:00 2 [IU] via SUBCUTANEOUS

## 2019-02-04 MED ORDER — SODIUM CHLORIDE 0.9 % IV SOLN
1.0000 g | Freq: Once | INTRAVENOUS | Status: AC
  Administered 2019-02-05: 03:00:00 1 g via INTRAVENOUS
  Filled 2019-02-04 (×2): qty 1

## 2019-02-04 MED ORDER — LACTATED RINGERS IV BOLUS
1000.0000 mL | Freq: Once | INTRAVENOUS | Status: AC
  Administered 2019-02-05: 1000 mL via INTRAVENOUS

## 2019-02-04 MED ORDER — LEVOFLOXACIN IN D5W 500 MG/100ML IV SOLN
500.0000 mg | Freq: Once | INTRAVENOUS | Status: AC
  Administered 2019-02-04: 500 mg via INTRAVENOUS
  Filled 2019-02-04: qty 100

## 2019-02-04 MED ORDER — SODIUM CHLORIDE 0.9 % IV BOLUS
1000.0000 mL | Freq: Once | INTRAVENOUS | Status: AC
  Administered 2019-02-04: 22:00:00 1000 mL via INTRAVENOUS

## 2019-02-04 MED ORDER — SODIUM CHLORIDE 0.9 % IV BOLUS
1000.0000 mL | Freq: Once | INTRAVENOUS | Status: AC
  Administered 2019-02-04: 20:00:00 1000 mL via INTRAVENOUS

## 2019-02-04 MED ORDER — SODIUM CHLORIDE 0.9 % IV SOLN
INTRAVENOUS | Status: DC
  Administered 2019-02-04 – 2019-02-07 (×8): via INTRAVENOUS

## 2019-02-04 MED ORDER — SULFAMETHOXAZOLE-TRIMETHOPRIM 800-160 MG PO TABS: 1.0000 | ORAL_TABLET | Freq: Once | ORAL | Status: DC

## 2019-02-04 MED ORDER — HYDROMORPHONE HCL 1 MG/ML IJ SOLN: 1.0000 mg | Freq: Once | INTRAMUSCULAR | Status: DC

## 2019-02-04 NOTE — ED Provider Notes (Signed)
[facility] EMERGENCY DEPARTMENT Provider Note   CSN: [id number] Arrival date & time: 02/04/19  1532    History   Chief Complaint Chief Complaint  Patient presents with   Back Pain    HPI Kristine Rodriguez is a 73 y.o. female.     HPI Patient presents to the emergency room for evaluation of back pain.  Patient states she started having symptoms back in December.  Patient states she first noticed it when her son picked her up.  She started having pain in her mid and lower back.  She first tried taking some over-the-counter medications.  When the symptoms did not resolve she went to a chiropractor.  Patient states when the chiropractor evaluated her she had exquisite tenderness to the bones therefore he felt she need to see an orthopedic doctor.  Patient ended up seeing Dr. Luna Glasgow back in March.  He recommended an MRI of her thoracic and lumbar spine.  Unfortunately because of the covid pandemic the patient has not been able to get her MRI.  Her symptoms have not been getting any better.  She was prescribed her code own and has been taking that sparingly but does not think that it is helped that much.  She is having poor appetite now.  She feels nauseated.  She feels like she is losing weight.  She denies any focal numbness or weakness.  No incontinence.  No fevers. Past Medical History:  Diagnosis Date   Cancer [facility])    hx of breast cancer left   Diabetes mellitus    Hypertension     Patient Active Problem List   Diagnosis Date Noted   Mixed hyperlipidemia 08/12/2017   Uncontrolled type 2 diabetes mellitus with hyperglycemia ([place]) 08/12/2017   Current smoker 08/12/2017   Essential hypertension, benign 08/12/2017    Past Surgical History:  Procedure Laterality Date   bladder tack     COLONOSCOPY  06/04/2012   Procedure: COLONOSCOPY;  Surgeon: Jamesetta So, MD;  Location: [facility];  Service: Gastroenterology;  Laterality: N/A;   ESOPHAGOGASTRODUODENOSCOPY   06/04/2012   Procedure: ESOPHAGOGASTRODUODENOSCOPY (EGD);  Surgeon: Jamesetta So, MD;  Location: [facility] ENDO [facility];  Service: Gastroenterology;  Laterality: N/A;   HERNIA REPAIR     left mastectomy     TONSILLECTOMY     VESICO-VAGINAL FISTULA REPAIR       OB History   No obstetric history on file.      Home Medications    Prior to Admission medications   Medication Sig Start Date End Date Taking? Authorizing Provider  albuterol (PROVENTIL) (2.5 MG/3ML) 0.083% nebulizer solution Take 2.5 mg by nebulization every 6 (six) hours as needed for wheezing or shortness of breath.    [provider]  B-D ULTRAFINE III SHORT PEN 31G X 8 MM MISC USE ONCE AT BEDTIME 12/06/18   Cassandria Anger, MD  glucose blood (ONE TOUCH ULTRA TEST) test strip TEST TWICE DAILY 07/06/18   Cassandria Anger, MD  HYDROcodone-acetaminophen (NORCO/VICODIN) 5-325 MG tablet One tablet every six hours for pain.  Limit 7 days. 01/10/19   Sanjuana Kava, MD  Insulin Glargine, 1 Unit Dial, 300 UNIT/ML SOPN Inject 30 Units into the skin daily with breakfast.    [provider]  metFORMIN (GLUCOPHAGE) 1000 MG tablet TAKE 1 TABLET BY MOUTH TWICE DAILY WITH FOOD 01/31/19   Cassandria Anger, MD  Omega-3 Fatty Acids (FISH OIL OMEGA-3 PO) Take by mouth daily.    [provider]  valsartan-hydrochlorothiazide (DIOVAN-HCT) 160-12.5 MG tablet TAKE 1 TABLET BY MOUTH EVERY DAY 12/14/18   Cassandria Anger, MD  Vitamin D, Ergocalciferol, 2000 units CAPS Take by mouth daily.    [provider]    Family History Family History  Problem Relation Age of Onset   Hypertension Mother    Diabetes Father    Hypertension Father     Social History Social History   Tobacco Use   Smoking status: Current Every Day Smoker    Packs/day: 0.50    Types: Cigarettes   Smokeless tobacco: Never Used  Substance Use Topics   Alcohol use: Yes    Comment: socially drink   Drug use: No      Allergies   Duricef [cefadroxil] and Tape   Review of Systems Review of Systems  All other systems reviewed and are negative.    Physical Exam Updated Vital Signs BP 114/67    Pulse (!) 103    Resp 18    Ht 1.549 m (5\' 1" )    Wt 56.7 kg    SpO2 100%    BMI 23.62 kg/m   Physical Exam Vitals signs and nursing note reviewed.  Constitutional:      General: She is not in acute distress.    Appearance: She is well-developed.  HENT:     Head: Normocephalic and atraumatic.     Right Ear: External ear normal.     Left Ear: External ear normal.  Eyes:     General: No scleral icterus.       Right eye: No discharge.        Left eye: No discharge.     Conjunctiva/sclera: Conjunctivae normal.  Neck:     Musculoskeletal: Neck supple.     Trachea: No tracheal deviation.  Cardiovascular:     Rate and Rhythm: Normal rate and regular rhythm.  Pulmonary:     Effort: Pulmonary effort is normal. No respiratory distress.     Breath sounds: Normal breath sounds. No stridor. No wheezing or rales.  Abdominal:     General: Bowel sounds are normal. There is no distension.     Palpations: Abdomen is soft.     Tenderness: There is no abdominal tenderness. There is no guarding or rebound.  Musculoskeletal:     Thoracic back: She exhibits tenderness and bony tenderness.     Lumbar back: She exhibits tenderness and bony tenderness.  Skin:    General: Skin is warm and dry.     Findings: No rash.  Neurological:     Mental Status: She is alert.     Cranial Nerves: No cranial nerve deficit (no facial droop, extraocular movements intact, no slurred speech).     Sensory: No sensory deficit.     Motor: No abnormal muscle tone or seizure activity.     Coordination: Coordination normal.      ED Treatments / Results  Labs (all labs ordered are listed, but only abnormal results are displayed) Labs Reviewed  CBC - Abnormal; Notable for the following components:      Result Value   WBC 13.2  (*)    Platelets 407 (*)    All other components within normal limits  COMPREHENSIVE METABOLIC PANEL - Abnormal; Notable for the following components:   Sodium 131 (*)    Chloride 95 (*)    CO2 17 (*)    Glucose, Bld 245 (*)    BUN 93 (*)    Creatinine, Ser  2.59 (*)    GFR calc non Af Amer 18 (*)    GFR calc Af Amer 21 (*)    Anion gap 19 (*)    All other components within normal limits  URINALYSIS, ROUTINE W REFLEX MICROSCOPIC - Abnormal; Notable for the following components:   APPearance CLOUDY (*)    Hgb urine dipstick SMALL (*)    Ketones, ur 5 (*)    Protein, ur 100 (*)    Leukocytes,Ua LARGE (*)    WBC, UA >50 (*)    Bacteria, UA MANY (*)    Non Squamous Epithelial 0-5 (*)    All other components within normal limits  CBG MONITORING, ED - Abnormal; Notable for the following components:   Glucose-Capillary 218 (*)    All other components within normal limits  URINE CULTURE  LIPASE, BLOOD    EKG None  Radiology Mr Thoracic Spine Wo Contrast  Result Date: 02/04/2019 CLINICAL DATA:  Acute midline thoracic back pain. Chronic bilateral lower back pain without radiculopathy. EXAM: MRI THORACIC AND LUMBAR SPINE WITHOUT CONTRAST TECHNIQUE: Multiplanar and multiecho pulse sequences of the thoracic and lumbar spine were obtained without intravenous contrast. COMPARISON:  Thoracic and lumbar spine x-rays dated December 28, 2018. FINDINGS: MRI THORACIC SPINE FINDINGS Alignment:  Physiologic. Vertebrae: No fracture, evidence of discitis, or bone lesion. Cord:  Normal signal and morphology. Paraspinal and other soft tissues: There are a few small pulmonary nodules in the right upper lobe measuring up to 5 mm. Otherwise negative. Disc levels: No significant disc bulge or herniation. No spinal canal or neuroforaminal stenosis. MRI LUMBAR SPINE FINDINGS Segmentation:  Standard. Alignment:  Physiologic. Vertebrae:  No fracture, evidence of discitis, or bone lesion. Conus medullaris and cauda  equina: Conus extends to the L1-L2 level. Conus and cauda equina appear normal. Paraspinal and other soft tissues: Negative. Disc levels: T12-L1 to L3-L4: No significant disc bulge or herniation. No spinal canal or neuroforaminal stenosis. L4-L5: Minimal disc bulging. Mild right and minimal left facet arthropathy. No stenosis. L5-S1: Negative disc. Mild bilateral facet arthropathy. No stenosis. IMPRESSION: Thoracic spine: 1. No acute osseous abnormality or significant degenerative changes. 2. There are few small pulmonary nodules in the right upper lobe measuring up to 5 mm. Recommend non-emergent CT chest without contrast for further evaluation. Lumbar spine: 1.  No acute osseous abnormality. 2. Mild lower lumbar facet arthropathy.  No stenosis or impingement. Electronically Signed   By: Titus Dubin M.D.   On: 02/04/2019 17:22   Mr Lumbar Spine Wo Contrast  Result Date: 02/04/2019 CLINICAL DATA:  Acute midline thoracic back pain. Chronic bilateral lower back pain without radiculopathy. EXAM: MRI THORACIC AND LUMBAR SPINE WITHOUT CONTRAST TECHNIQUE: Multiplanar and multiecho pulse sequences of the thoracic and lumbar spine were obtained without intravenous contrast. COMPARISON:  Thoracic and lumbar spine x-rays dated December 28, 2018. FINDINGS: MRI THORACIC SPINE FINDINGS Alignment:  Physiologic. Vertebrae: No fracture, evidence of discitis, or bone lesion. Cord:  Normal signal and morphology. Paraspinal and other soft tissues: There are a few small pulmonary nodules in the right upper lobe measuring up to 5 mm. Otherwise negative. Disc levels: No significant disc bulge or herniation. No spinal canal or neuroforaminal stenosis. MRI LUMBAR SPINE FINDINGS Segmentation:  Standard. Alignment:  Physiologic. Vertebrae:  No fracture, evidence of discitis, or bone lesion. Conus medullaris and cauda equina: Conus extends to the L1-L2 level. Conus and cauda equina appear normal. Paraspinal and other soft tissues:  Negative. Disc levels: T12-L1 to L3-L4: No  significant disc bulge or herniation. No spinal canal or neuroforaminal stenosis. L4-L5: Minimal disc bulging. Mild right and minimal left facet arthropathy. No stenosis. L5-S1: Negative disc. Mild bilateral facet arthropathy. No stenosis. IMPRESSION: Thoracic spine: 1. No acute osseous abnormality or significant degenerative changes. 2. There are few small pulmonary nodules in the right upper lobe measuring up to 5 mm. Recommend non-emergent CT chest without contrast for further evaluation. Lumbar spine: 1.  No acute osseous abnormality. 2. Mild lower lumbar facet arthropathy.  No stenosis or impingement. Electronically Signed   By: Titus Dubin M.D.   On: 02/04/2019 17:22   Ct Renal Stone Study  Result Date: 02/04/2019 CLINICAL DATA:  Low back pain with nausea for 4 months. History of left breast cancer and diabetes EXAM: CT ABDOMEN AND PELVIS WITHOUT CONTRAST TECHNIQUE: Multidetector CT imaging of the abdomen and pelvis was performed following the standard protocol without IV contrast. COMPARISON:  None. FINDINGS: Lower chest: Mild emphysema and scattered scarring in both lung bases. No significant pleural or pericardial effusion. There is a small hiatal hernia. Hepatobiliary: There are several ill-defined attic lesions which are highly suspicious for metastatic disease. Largest lesion is located inferiorly in the right hepatic lobe (segment 6), measuring 2.4 x 2.1 cm on image 20/2. There is a small gallstone. No evidence of gallbladder wall thickening, surrounding inflammation or biliary dilatation. Pancreas: There is a large mass involving the pancreatic body and tail, measuring up to 6.3 x 5.2 cm on image 16/2. This partially encases the celiac trunk and is highly suspicious for pancreatic adenocarcinoma. Spleen: Normal in size without focal abnormality. Adrenals/Urinary Tract: Minimal nodularity of both adrenal glands, nonspecific. There are several  nonobstructing small right renal calculi. No evidence of ureteral calculus or hydronephrosis. Probable pelvic floor laxity. The bladder otherwise appears normal. Stomach/Bowel: No evidence of bowel wall thickening, distention or surrounding inflammatory change. Moderate diverticular changes in the distal colon. The appendix appears normal. Vascular/Lymphatic: There are several mildly enlarged lymph nodes in the upper retroperitoneum adjacent to the pancreatic mass, suspicious for metastatic disease. Largest is a left periaortic node measuring 15 mm on image 21/2. There is mild aortic and branch vessel atherosclerosis. As above, the pancreatic mass partially encases the celiac trunk as well as the splenic vessels. Reproductive: The uterus and ovaries appear normal. No adnexal mass. Probable pelvic floor laxity. Other: No ascites or peritoneal nodular postsurgical changes in the low anterior abdominal wall. Musculoskeletal: There is deformity of the left pelvic bones which may be developmental or posttraumatic. No acute osseous findings or signs of osseous metastatic disease. IMPRESSION: 1. Large mass involving the pancreatic body and tail with probable vascular encasement, adjacent adenopathy and hepatic metastases consistent with metastatic pancreatic adenocarcinoma. Tissue sampling recommended. 2. Cholelithiasis without evidence of cholecystitis or biliary dilatation. 3. Nonobstructing right renal calculi. 4. Prominent distal colonic diverticulosis without acute inflammation. 5. No clear explanation for back pain. 6.  Aortic Atherosclerosis (ICD10-I70.0). Electronically Signed   By: Richardean Sale M.D.   On: 02/04/2019 20:18    Procedures Procedures (including critical care time)  Medications Ordered in ED Medications  sulfamethoxazole-trimethoprim (BACTRIM DS) 800-160 MG per tablet 1 tablet (has no administration in time range)  sodium chloride 0.9 % bolus 1,000 mL (1,000 mLs Intravenous New Bag/Given  02/04/19 2000)     Initial Impression / Assessment and Plan / ED Course  I have reviewed the triage vital signs and the nursing notes.  Pertinent labs & imaging results that were available during  my care of the patient were reviewed by me and considered in my medical decision making (see chart for details).  Clinical Course as of Feb 04 2047  Fri Feb 04, 2019  1926 Labs do show an acute increase in the creatinine.  BUN and creatinine elevated compared to previous.  Urinalysis still pending   [JK]  1927 MRI did not show any acute abnormalities.  Considering her abnormal renal function and elevated white blood cell count I will do a CT scan to evaluate for any renal pathology   [JK]    Clinical Course User Index [JK] Dorie Rank, MD     Patient presented to the emergency room for evaluation of worsening back pain over several months.  Patient started developing nausea and weight loss.  Patient had plans for outpatient MRI of her thoracic and lumbar spine.  This was performed in the ED.  No acute findings were noted on her MRI to account for her pain.  Patient's renal function is acutely creased compared to previous labs.  I was concerned about the possibility of a renal mass causing her AKI.  CT scan unfortunately shows that she actually has a mass consistent with metastatic pancreatic cancer.  I discussed these findings with the patient.  I will consult for admission for further workup considering her AKI and the difficulty in getting appropriate outpatient testing in the setting of the COVID pandemic.  Final Clinical Impressions(s) / ED Diagnoses   Final diagnoses:  Malignant neoplasm of pancreas, unspecified location of malignancy ([place])  AKI (acute kidney injury) ([place])  Urinary tract infection with hematuria, site unspecified      Dorie Rank, MD 02/04/19 2051

## 2019-02-04 NOTE — ED Notes (Signed)
EKG exported per Dr. Olevia Bowens

## 2019-02-04 NOTE — ED Triage Notes (Signed)
Pt states having mid back pain with decreased appetite since December

## 2019-02-04 NOTE — H&P (Signed)
History and Physical    Kristine Rodriguez:[id number] DOB: [birth year] DOA: 02/04/2019  PCP: Sharilyn Sites, MD   Patient coming from: Home.  I have personally briefly reviewed patient's old medical records in [place]  Chief Complaint: Back pain.  HPI: Kristine Rodriguez is a 73 y.o. female with medical history significant of breast cancer, type 2 diabetes, hypertension, hyperlipidemia, tobacco use who is coming to the emergency department with complaints of progressively worse mid back pain since December when she enter her son's be a call in December.  Since then, she has had decreased appetite, nausea, weight loss, dizziness and generalized weakness.  Her stools have been likely loose.  She denies melena, but has occasional mild hematochezia from hemorrhoids. She recently has had complains of decreased urination and decreased urine volume.  She denies dysuria or flank pain.  She denies emesis, she initially took over-the-counter medications without significant relief.she then went to see a chiropractor, but was referred to orthopedic surgery who gave her analgesics and order an MRI.  However, she was unable to get the MRI due to the COVID-19 pandemic.  She denies lower extremities weakness, paresthesias, fecal or urinary incontinence.  She has been lightheaded.  She has been having chills, but denies fever, sore throat, chest pain, palpitations, diaphoresis, PND, orthopnea or pitting edema of the lower extremities.  She denies polyuria, polydipsia, polyphagia or blurred vision.  ED Course: Initial vital signs temperature 97.6 F, pulse 110, respirations 18, blood pressure 105/69 mmHg and O2 sat 100% on room air.  The patient received a 1000 mL normal saline bolus in the emergency department.  Her urinalysis shows small hemoglobinuria, ketones of 5 and protein of 100 mg/dL, large leukocyte esterase, RBC from 21-50, WBC more than 50 per hpf and many bacteria.  There there are WBC clumps and mucus  present.  White count was 13.2, hemoglobin 14.0 g/dL and platelets 407.  Lipase was normal.  Her LFTs were normal.  Sodium was 131, potassium 4.1, chloride 95 and CO2 17 mmol/L.  Glucose was 245, BUN 93 and creatinine 2.59 mg/dL.  On 12/22/2018, her BUN was 22 and creatinine 0.96 mg/dL.  Imaging: MRI of thoracic and lumbar spine did not show any acute abnormalities.  However they are few small pulmonary nodules in the right upper lobe and nonemergent CT chest without contrast is recommended.  CT renal study showed a large mass involving the pancreatic body and tail with probable vascular encasement, adjacent adenopathy and hepatic metastasis consistent with metastatic pancreatic adenocarcinoma.  Tissue sampling recommended.  There was cholelithiasis without evidence of cholecystitis.  Nonobstructing right renal calculi.  See images and full radiology report for further detail.  Review of Systems: As per HPI otherwise 10 point review of systems negative.   Past Medical History:  Diagnosis Date   Cancer [facility])    hx of breast cancer left   Diabetes mellitus    Hypertension    Tobacco use 02/04/2019    Past Surgical History:  Procedure Laterality Date   bladder tack     COLONOSCOPY  06/04/2012   Procedure: COLONOSCOPY;  Surgeon: Jamesetta So, MD;  Location: [facility];  Service: Gastroenterology;  Laterality: N/A;   ESOPHAGOGASTRODUODENOSCOPY  06/04/2012   Procedure: ESOPHAGOGASTRODUODENOSCOPY (EGD);  Surgeon: Jamesetta So, MD;  Location: [facility];  Service: Gastroenterology;  Laterality: N/A;   HERNIA REPAIR     left mastectomy     TONSILLECTOMY     VESICO-VAGINAL FISTULA  REPAIR       reports that she has been smoking cigarettes. She has been smoking about 0.50 packs per day. She has never used smokeless tobacco. She reports current alcohol use. She reports that she does not use drugs.  Allergies  Allergen Reactions   Duricef [Cefadroxil] Shortness Of Breath and  Swelling   Tape Rash    Family History  Problem Relation Age of Onset   Hypertension Mother    Pancreatic cancer Mother 49   Diabetes Father    Hypertension Father    Liver cancer Father    Pancreatic cancer Sister 29   Prior to Admission medications   Medication Sig Start Date End Date Taking? Authorizing Provider  albuterol (PROVENTIL) (2.5 MG/3ML) 0.083% nebulizer solution Take 2.5 mg by nebulization every 6 (six) hours as needed for wheezing or shortness of breath.    [provider]  B-D ULTRAFINE III SHORT PEN 31G X 8 MM MISC USE ONCE AT BEDTIME 12/06/18   Cassandria Anger, MD  glucose blood (ONE TOUCH ULTRA TEST) test strip TEST TWICE DAILY 07/06/18   Cassandria Anger, MD  HYDROcodone-acetaminophen (NORCO/VICODIN) 5-325 MG tablet One tablet every six hours for pain.  Limit 7 days. 01/10/19   Sanjuana Kava, MD  Insulin Glargine, 1 Unit Dial, 300 UNIT/ML SOPN Inject 30 Units into the skin daily with breakfast.    [provider]  metFORMIN (GLUCOPHAGE) 1000 MG tablet TAKE 1 TABLET BY MOUTH TWICE DAILY WITH FOOD 01/31/19   Cassandria Anger, MD  Omega-3 Fatty Acids (FISH OIL OMEGA-3 PO) Take by mouth daily.    [provider]  valsartan-hydrochlorothiazide (DIOVAN-HCT) 160-12.5 MG tablet TAKE 1 TABLET BY MOUTH EVERY DAY 12/14/18   Cassandria Anger, MD  Vitamin D, Ergocalciferol, 2000 units CAPS Take by mouth daily.    [provider]    Physical Exam: Vitals:   02/04/19 2200 02/04/19 2213 02/04/19 2226 02/04/19 2233  BP: (!) 84/64 (!) 87/69  116/68  Pulse: (!) 106 (!) 101 98   Resp:   (!) 22 (!) 23  SpO2: 100% 100% 99%   Weight:      Height:        Constitutional: Looks chronically ill, but in NAD, calm, comfortable Eyes: PERRL, lids and conjunctivae normal ENMT: Mucous membranes and lips are dry. Posterior pharynx clear of any exudate or lesions. Neck: normal, supple, no masses, no thyromegaly Respiratory: clear to  auscultation bilaterally, no wheezing, no crackles. Normal respiratory effort. No accessory muscle use.  Cardiovascular: Regular rate and rhythm, no murmurs / rubs / gallops. No extremity edema. 2+ pedal pulses. No carotid bruits.  Abdomen:  Bowel sounds positive.  Soft, positive epigastric and LUQ tenderness, no guarding or rebound, no masses palpated. No hepatosplenomegaly. Musculoskeletal: no clubbing / cyanosis. Good ROM, no contractures. Normal muscle tone.  Skin: no rashes, lesions, ulcers on limited dermatological examination. Neurologic: CN 2-12 grossly intact. Sensation intact, DTR normal. Strength 5/5 in all 4.  Psychiatric: Normal judgment and insight. Alert and oriented x 3.  Labs on Admission: I have personally reviewed following labs and imaging studies  CBC: Recent Labs  Lab 02/04/19 1721  WBC 13.2*  HGB 14.0  HCT 43.2  MCV 90.0  PLT 409*   Basic Metabolic Panel: Recent Labs  Lab 02/04/19 1721  NA 131*  K 4.1  CL 95*  CO2 17*  GLUCOSE 245*  BUN 93*  CREATININE 2.59*  CALCIUM 9.6   GFR: Estimated Creatinine Clearance:  15 mL/min (A) (by C-G formula based on SCr of 2.59 mg/dL (H)). Liver Function Tests: Recent Labs  Lab 02/04/19 1721  AST 15  ALT 18  ALKPHOS 84  BILITOT 0.5  PROT 7.8  ALBUMIN 4.1   Recent Labs  Lab 02/04/19 1721  LIPASE 33   No results for input(s): AMMONIA in the last 168 hours. Coagulation Profile: No results for input(s): INR, PROTIME in the last 168 hours. Cardiac Enzymes: No results for input(s): CKTOTAL, CKMB, CKMBINDEX, TROPONINI in the last 168 hours. BNP (last 3 results) No results for input(s): PROBNP in the last 8760 hours. HbA1C: No results for input(s): HGBA1C in the last 72 hours. CBG: Recent Labs  Lab 02/04/19 1943  GLUCAP 218*   Lipid Profile: No results for input(s): CHOL, HDL, LDLCALC, TRIG, CHOLHDL, LDLDIRECT in the last 72 hours. Thyroid Function Tests: No results for input(s): TSH, T4TOTAL, FREET4,  T3FREE, THYROIDAB in the last 72 hours. Anemia Panel: No results for input(s): VITAMINB12, FOLATE, FERRITIN, TIBC, IRON, RETICCTPCT in the last 72 hours. Urine analysis:    Component Value Date/Time   COLORURINE YELLOW 02/04/2019 1939   APPEARANCEUR CLOUDY (A) 02/04/2019 1939   LABSPEC 1.015 02/04/2019 1939   PHURINE 5.0 02/04/2019 1939   GLUCOSEU NEGATIVE 02/04/2019 1939   HGBUR SMALL (A) 02/04/2019 1939   BILIRUBINUR NEGATIVE 02/04/2019 1939   KETONESUR 5 (A) 02/04/2019 1939   PROTEINUR 100 (A) 02/04/2019 1939   UROBILINOGEN 1.0 09/16/2007 1009   NITRITE NEGATIVE 02/04/2019 1939   LEUKOCYTESUR LARGE (A) 02/04/2019 1939    Radiological Exams on Admission: Mr Thoracic Spine Wo Contrast  Result Date: 02/04/2019 CLINICAL DATA:  Acute midline thoracic back pain. Chronic bilateral lower back pain without radiculopathy. EXAM: MRI THORACIC AND LUMBAR SPINE WITHOUT CONTRAST TECHNIQUE: Multiplanar and multiecho pulse sequences of the thoracic and lumbar spine were obtained without intravenous contrast. COMPARISON:  Thoracic and lumbar spine x-rays dated December 28, 2018. FINDINGS: MRI THORACIC SPINE FINDINGS Alignment:  Physiologic. Vertebrae: No fracture, evidence of discitis, or bone lesion. Cord:  Normal signal and morphology. Paraspinal and other soft tissues: There are a few small pulmonary nodules in the right upper lobe measuring up to 5 mm. Otherwise negative. Disc levels: No significant disc bulge or herniation. No spinal canal or neuroforaminal stenosis. MRI LUMBAR SPINE FINDINGS Segmentation:  Standard. Alignment:  Physiologic. Vertebrae:  No fracture, evidence of discitis, or bone lesion. Conus medullaris and cauda equina: Conus extends to the L1-L2 level. Conus and cauda equina appear normal. Paraspinal and other soft tissues: Negative. Disc levels: T12-L1 to L3-L4: No significant disc bulge or herniation. No spinal canal or neuroforaminal stenosis. L4-L5: Minimal disc bulging. Mild right  and minimal left facet arthropathy. No stenosis. L5-S1: Negative disc. Mild bilateral facet arthropathy. No stenosis. IMPRESSION: Thoracic spine: 1. No acute osseous abnormality or significant degenerative changes. 2. There are few small pulmonary nodules in the right upper lobe measuring up to 5 mm. Recommend non-emergent CT chest without contrast for further evaluation. Lumbar spine: 1.  No acute osseous abnormality. 2. Mild lower lumbar facet arthropathy.  No stenosis or impingement. Electronically Signed   By: Titus Dubin M.D.   On: 02/04/2019 17:22   Mr Lumbar Spine Wo Contrast  Result Date: 02/04/2019 CLINICAL DATA:  Acute midline thoracic back pain. Chronic bilateral lower back pain without radiculopathy. EXAM: MRI THORACIC AND LUMBAR SPINE WITHOUT CONTRAST TECHNIQUE: Multiplanar and multiecho pulse sequences of the thoracic and lumbar spine were obtained without intravenous contrast. COMPARISON:  Thoracic and lumbar spine x-rays dated December 28, 2018. FINDINGS: MRI THORACIC SPINE FINDINGS Alignment:  Physiologic. Vertebrae: No fracture, evidence of discitis, or bone lesion. Cord:  Normal signal and morphology. Paraspinal and other soft tissues: There are a few small pulmonary nodules in the right upper lobe measuring up to 5 mm. Otherwise negative. Disc levels: No significant disc bulge or herniation. No spinal canal or neuroforaminal stenosis. MRI LUMBAR SPINE FINDINGS Segmentation:  Standard. Alignment:  Physiologic. Vertebrae:  No fracture, evidence of discitis, or bone lesion. Conus medullaris and cauda equina: Conus extends to the L1-L2 level. Conus and cauda equina appear normal. Paraspinal and other soft tissues: Negative. Disc levels: T12-L1 to L3-L4: No significant disc bulge or herniation. No spinal canal or neuroforaminal stenosis. L4-L5: Minimal disc bulging. Mild right and minimal left facet arthropathy. No stenosis. L5-S1: Negative disc. Mild bilateral facet arthropathy. No stenosis.  IMPRESSION: Thoracic spine: 1. No acute osseous abnormality or significant degenerative changes. 2. There are few small pulmonary nodules in the right upper lobe measuring up to 5 mm. Recommend non-emergent CT chest without contrast for further evaluation. Lumbar spine: 1.  No acute osseous abnormality. 2. Mild lower lumbar facet arthropathy.  No stenosis or impingement. Electronically Signed   By: Titus Dubin M.D.   On: 02/04/2019 17:22   Ct Renal Stone Study  Result Date: 02/04/2019 CLINICAL DATA:  Low back pain with nausea for 4 months. History of left breast cancer and diabetes EXAM: CT ABDOMEN AND PELVIS WITHOUT CONTRAST TECHNIQUE: Multidetector CT imaging of the abdomen and pelvis was performed following the standard protocol without IV contrast. COMPARISON:  None. FINDINGS: Lower chest: Mild emphysema and scattered scarring in both lung bases. No significant pleural or pericardial effusion. There is a small hiatal hernia. Hepatobiliary: There are several ill-defined attic lesions which are highly suspicious for metastatic disease. Largest lesion is located inferiorly in the right hepatic lobe (segment 6), measuring 2.4 x 2.1 cm on image 20/2. There is a small gallstone. No evidence of gallbladder wall thickening, surrounding inflammation or biliary dilatation. Pancreas: There is a large mass involving the pancreatic body and tail, measuring up to 6.3 x 5.2 cm on image 16/2. This partially encases the celiac trunk and is highly suspicious for pancreatic adenocarcinoma. Spleen: Normal in size without focal abnormality. Adrenals/Urinary Tract: Minimal nodularity of both adrenal glands, nonspecific. There are several nonobstructing small right renal calculi. No evidence of ureteral calculus or hydronephrosis. Probable pelvic floor laxity. The bladder otherwise appears normal. Stomach/Bowel: No evidence of bowel wall thickening, distention or surrounding inflammatory change. Moderate diverticular changes  in the distal colon. The appendix appears normal. Vascular/Lymphatic: There are several mildly enlarged lymph nodes in the upper retroperitoneum adjacent to the pancreatic mass, suspicious for metastatic disease. Largest is a left periaortic node measuring 15 mm on image 21/2. There is mild aortic and branch vessel atherosclerosis. As above, the pancreatic mass partially encases the celiac trunk as well as the splenic vessels. Reproductive: The uterus and ovaries appear normal. No adnexal mass. Probable pelvic floor laxity. Other: No ascites or peritoneal nodular postsurgical changes in the low anterior abdominal wall. Musculoskeletal: There is deformity of the left pelvic bones which may be developmental or posttraumatic. No acute osseous findings or signs of osseous metastatic disease. IMPRESSION: 1. Large mass involving the pancreatic body and tail with probable vascular encasement, adjacent adenopathy and hepatic metastases consistent with metastatic pancreatic adenocarcinoma. Tissue sampling recommended. 2. Cholelithiasis without evidence of cholecystitis or biliary dilatation. 3.  Nonobstructing right renal calculi. 4. Prominent distal colonic diverticulosis without acute inflammation. 5. No clear explanation for back pain. 6.  Aortic Atherosclerosis (ICD10-I70.0). Electronically Signed   By: Richardean Sale M.D.   On: 02/04/2019 20:18    EKG: Independently reviewed. Vent. rate 98 BPM PR interval * ms QRS duration 127 ms QT/QTc 375/479 ms P-R-T axes 0 -63 82 Sinus rhythm RBBB and LAFB ST elevation, consider inferior injury Baseline wander in lead(s) V1  Assessment/Plan Principal Problem:   AKI (acute kidney injury) ([place]) Admit to telemetry/inpatient. Continue IV fluids. Hold valsartan. Hold hydrochlorothiazide. Monitor intake and output. Follow-up renal function and electrolytes.  Active Problems:   Sepsis due to urinary tract infection (HCC) Allergic to cephalosporins. Had a local  reaction to Levaquin. Aztreonam per pharmacy was ordered. Follow-up urine culture and sensitivity.    Lactic acidosis Continue IV fluids. Follow-up lactic acid level. Metformin was held.    Hyponatremia Secondary to decreased oral intake. She also has been having some mild loose stools. Continue normal saline infusion. Check urine osmolality and urine sodium level. Follow-up sodium level.    Uncontrolled type 2 diabetes mellitus with hyperglycemia (HCC) Continue IV fluids. Metformin has been held. Continue Lantus 30 units SQ daily. CBG monitoring regular insulin sliding scale.      Pancreatic mass Check CEA and alpha-fetoprotein. Consult oncology during hospitalization. Consult IR once clinical state is better for possible biopsy. Consult palliative care given metastatic disease. Will need CT chest without contrast for further characterization of nodules.    Essential hypertension, benign Hold valsartan and hydrochlorothiazide. Continue IV fluids. Monitor blood pressure. Follow-up renal function and electrolytes.    Tobacco use Declined nicotine replacement therapy. Staff to provide tobacco cessation information.    Mixed hyperlipidemia Not on medical treatment. To be discussed with PCP in the future.   DVT prophylaxis: Lovenox SQ. Code Status: Full code. Family Communication: Disposition Plan: Admit to [facility] for treatment and further work-up. Consults called: Admission status: Inpatient/telemetry.   Reubin Milan MD [facility]  02/04/2019, 10:59 PM   This document was prepared using Dragon voice recognition software and may contain some unintended transcription errors.

## 2019-02-04 NOTE — ED Notes (Signed)
CRITICAL VALUE ALERT  Critical Value:  Lactic Acid 2.5  Date & Time Notied:  02/04/19 2257  Provider Notified: Dr. Olevia Bowens  Orders Received/Actions taken: na

## 2019-02-04 NOTE — ED Notes (Signed)
Patient states she feels nauseous and the urge to vomit. Patient states this is not a new symptom. States this occurs regularly from time to time at home.

## 2019-02-04 NOTE — Progress Notes (Signed)
Pharmacy Antibiotic Note  Kristine Rodriguez is a 73 y.o. female admitted on 02/04/2019 with UTI.  Pharmacy has been consulted for Levaquin dosing.  Plan: Levaquin 500mg  IV Q48hrs F/U cx and clinical progress Monitor V/S, lab Transition to po as indicated  Height: 5\' 1"  (154.9 cm) Weight: 125 lb (56.7 kg) IBW/kg (Calculated) : 47.8  No data recorded.  Recent Labs  Lab 02/04/19 1721  WBC 13.2*  CREATININE 2.59*    Estimated Creatinine Clearance: 15 mL/min (A) (by C-G formula based on SCr of 2.59 mg/dL (H)).    Allergies  Allergen Reactions  . Duricef [Cefadroxil] Shortness Of Breath and Swelling  . Tape Rash    Antimicrobials this admission: levaquin 4/24 >>   Dose adjustments this admission: n/a  Microbiology results: 4/24 UCx: pending  Thank you for allowing pharmacy to be a part of this patient's care.  Kristine Rodriguez, BS Kristine Rodriguez, [place] Clinical Pharmacist Pager [phone] 02/04/2019 9:24 PM

## 2019-02-04 NOTE — ED Notes (Signed)
Patient had a localized reaction to IV Levaquin. D/C Levaquin and started NS bolus.

## 2019-02-05 ENCOUNTER — Inpatient Hospital Stay: Payer: Self-pay

## 2019-02-05 ENCOUNTER — Inpatient Hospital Stay (HOSPITAL_COMMUNITY): Payer: PPO

## 2019-02-05 DIAGNOSIS — K769 Liver disease, unspecified: Secondary | ICD-10-CM

## 2019-02-05 DIAGNOSIS — E872 Acidosis: Secondary | ICD-10-CM

## 2019-02-05 DIAGNOSIS — E871 Hypo-osmolality and hyponatremia: Secondary | ICD-10-CM

## 2019-02-05 DIAGNOSIS — Z881 Allergy status to other antibiotic agents status: Secondary | ICD-10-CM

## 2019-02-05 DIAGNOSIS — E119 Type 2 diabetes mellitus without complications: Secondary | ICD-10-CM

## 2019-02-05 DIAGNOSIS — Z9012 Acquired absence of left breast and nipple: Secondary | ICD-10-CM

## 2019-02-05 DIAGNOSIS — Z9221 Personal history of antineoplastic chemotherapy: Secondary | ICD-10-CM

## 2019-02-05 DIAGNOSIS — K8689 Other specified diseases of pancreas: Secondary | ICD-10-CM | POA: Diagnosis present

## 2019-02-05 DIAGNOSIS — F17210 Nicotine dependence, cigarettes, uncomplicated: Secondary | ICD-10-CM

## 2019-02-05 DIAGNOSIS — I10 Essential (primary) hypertension: Secondary | ICD-10-CM

## 2019-02-05 DIAGNOSIS — R634 Abnormal weight loss: Secondary | ICD-10-CM

## 2019-02-05 DIAGNOSIS — Z853 Personal history of malignant neoplasm of breast: Secondary | ICD-10-CM

## 2019-02-05 DIAGNOSIS — Z800 Family history of malignant neoplasm of digestive organs: Secondary | ICD-10-CM

## 2019-02-05 DIAGNOSIS — Z91048 Other nonmedicinal substance allergy status: Secondary | ICD-10-CM

## 2019-02-05 DIAGNOSIS — Z923 Personal history of irradiation: Secondary | ICD-10-CM

## 2019-02-05 LAB — SODIUM, URINE, RANDOM: Sodium, Ur: 29 mmol/L

## 2019-02-05 LAB — OSMOLALITY, URINE: Osmolality, Ur: 361 mOsm/kg (ref 300–900)

## 2019-02-05 LAB — GLUCOSE, CAPILLARY
Glucose-Capillary: 112 mg/dL — ABNORMAL HIGH (ref 70–99)
Glucose-Capillary: 125 mg/dL — ABNORMAL HIGH (ref 70–99)
Glucose-Capillary: 170 mg/dL — ABNORMAL HIGH (ref 70–99)
Glucose-Capillary: 196 mg/dL — ABNORMAL HIGH (ref 70–99)
Glucose-Capillary: 48 mg/dL — ABNORMAL LOW (ref 70–99)
Glucose-Capillary: 66 mg/dL — ABNORMAL LOW (ref 70–99)

## 2019-02-05 LAB — LACTIC ACID, PLASMA: Lactic Acid, Venous: 1.2 mmol/L (ref 0.5–1.9)

## 2019-02-05 MED ORDER — ENSURE ENLIVE PO LIQD
237.0000 mL | Freq: Two times a day (BID) | ORAL | Status: DC
  Administered 2019-02-05: 10:00:00 237 mL via ORAL

## 2019-02-05 MED ORDER — ENOXAPARIN SODIUM 30 MG/0.3ML ~~LOC~~ SOLN
30.0000 mg | SUBCUTANEOUS | Status: AC
  Administered 2019-02-05: 22:00:00 30 mg via SUBCUTANEOUS
  Filled 2019-02-05: qty 0.3

## 2019-02-05 MED ORDER — DEXTROSE 5 % IV SOLN
0.5000 g | Freq: Three times a day (TID) | INTRAVENOUS | Status: DC
  Administered 2019-02-05 – 2019-02-08 (×9): 0.5 g via INTRAVENOUS
  Filled 2019-02-05 (×14): qty 0.5

## 2019-02-05 MED ORDER — TRAMADOL HCL 50 MG PO TABS
50.0000 mg | ORAL_TABLET | Freq: Two times a day (BID) | ORAL | Status: DC | PRN
  Administered 2019-02-05: 02:00:00 50 mg via ORAL
  Filled 2019-02-05: qty 1

## 2019-02-05 MED ORDER — ALBUTEROL SULFATE (2.5 MG/3ML) 0.083% IN NEBU: 2.5000 mg | INHALATION_SOLUTION | RESPIRATORY_TRACT | Status: DC | PRN

## 2019-02-05 MED ORDER — OXYCODONE-ACETAMINOPHEN 5-325 MG PO TABS
1.0000 | ORAL_TABLET | ORAL | Status: DC | PRN
  Administered 2019-02-05 – 2019-02-08 (×15): 2 via ORAL
  Filled 2019-02-05 (×5): qty 2
  Filled 2019-02-05: qty 1
  Filled 2019-02-05 (×9): qty 2
  Filled 2019-02-05: qty 1

## 2019-02-05 MED ORDER — LIDOCAINE 5 % EX PTCH
1.0000 | MEDICATED_PATCH | CUTANEOUS | Status: DC
  Administered 2019-02-05 – 2019-02-08 (×4): 1 via TRANSDERMAL
  Filled 2019-02-05 (×4): qty 1

## 2019-02-05 MED ORDER — HYDROMORPHONE HCL 1 MG/ML IJ SOLN: 1.0000 mg | INTRAMUSCULAR | Status: DC | PRN

## 2019-02-05 MED ORDER — INSULIN GLARGINE 100 UNIT/ML ~~LOC~~ SOLN
30.0000 [IU] | Freq: Every morning | SUBCUTANEOUS | Status: DC
  Administered 2019-02-05: 10:00:00 30 [IU] via SUBCUTANEOUS
  Filled 2019-02-05: qty 0.3

## 2019-02-05 MED ORDER — INSULIN GLARGINE 100 UNIT/ML ~~LOC~~ SOLN
15.0000 [IU] | Freq: Every morning | SUBCUTANEOUS | Status: DC
  Filled 2019-02-05: qty 0.15

## 2019-02-05 MED ORDER — ALBUTEROL SULFATE HFA 108 (90 BASE) MCG/ACT IN AERS: 2.0000 | INHALATION_SPRAY | RESPIRATORY_TRACT | Status: DC | PRN

## 2019-02-05 NOTE — Consult Note (Signed)
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  She treated this initially symptomatically and then sought chiropractic help.  Chiropractor felt there was focal tenderness and referred her to Dr. Luna Rodriguez an orthopedist.  He obtained plain films of the thoracolumbar spine 12/28/2018 showing a possible fracture at T9.  The patient was then set up for further MRI evaluation but because of the current pandemic this was postponed.  More recently with a 50 pound weight loss over the past 8 months or Rodriguez, and continuing back pain, the patient presented to the emergency room 02/04/2019.  MRI of the spine obtained in the emergency room was unremarkable, but the patient was found to have acute kidney injury and a CT renal study was obtained which unfortunately showed a 6.3 cm pancreatic mass and multiple liver lesions the largest being 2.4 cm.  The gallbladder and spleen were unremarkable, there were no obvious bone metastases, and there was some nonspecific adenopathy.  We were consulted regarding further evaluation and treatment.  Of note the patient was diagnosed with left breast cancer and underwent lumpectomy in 1993 followed by chemotherapy, radiation, and tamoxifen.  I do not know what chemotherapy agents she received.  She was treated by Dr. Sonny Rodriguez who is 1 of our retired partners.  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    INTERVAL HISTORY: I met with the patient in her hospital room 02/05/2019.  No family was present  REVIEW OF SYSTEMS: Aside from the back pain and  weight loss, she has had mild nausea, decreased appetite, and no significant taste perversion.  She has not noted a change in bowel habits.  She denies fever, rash, bleeding, unusual headaches, visual changes, or gait imbalance.  A detailed review of systems today was otherwise noncontributory  PAST MEDICAL HISTORY: Past Medical History:  Diagnosis Date   Cancer ([place])    hx of breast cancer left   Diabetes mellitus    Hypertension    Tobacco use 02/04/2019    PAST SURGICAL HISTORY: Past Surgical History:  Procedure Laterality Date   bladder tack     COLONOSCOPY  06/04/2012   Procedure: COLONOSCOPY;  Surgeon: Kristine So, MD;  [facility];  Service: Gastroenterology;  Laterality: N/A;   ESOPHAGOGASTRODUODENOSCOPY  06/04/2012   Procedure: ESOPHAGOGASTRODUODENOSCOPY (EGD);  Surgeon: Kristine So, MD;  Location: [facility];  Service: Gastroenterology;  Laterality: N/A;   HERNIA REPAIR     left mastectomy     TONSILLECTOMY     VESICO-VAGINAL FISTULA REPAIR      FAMILY HISTORY Family History  Problem Relation Age of Onset   Hypertension Mother    Pancreatic cancer Mother 74   Diabetes Father    Hypertension Father    Liver cancer Father    Pancreatic cancer Sister 25  Patient's mother died from pancreatic cancer at the age of 51.  Patient's father had liver cancer, not in the setting of alcoholism.  He died at 60.  The patient has 1 sister who died from pancreatic cancer at  age 36.  One brother died from an aortic dissection at age 36.  One other brother survives and is in good health as far as the patient knows.  GYNECOLOGIC HISTORY:  No LMP recorded. Patient is postmenopausal. Menarche: 73 years old Age at first live birth: 73 years old [place] P 3 LMP at the start of chemotherapy in 1993 Contraceptiven/a  Hysterectomy?  No Salpingo-oophorectomy?  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  She is divorced and lives by herself.  Daughter Kristine Rodriguez lives in [place] and works for [employer]; son Kristine Rodriguez lives in [place] and works in Control and instrumentation engineer, and son Kristine Rodriguez.  Lives in [facility] and works for the [employer].  The patient has 6 grandchildren.  She is not a church attender    HEALTH MAINTENANCE: Social History   Tobacco Use   Smoking status: Current Every Day Smoker    Packs/day: 0.50    Types: Cigarettes   Smokeless tobacco: Never Used  Substance Use Topics   Alcohol use: Yes    Comment: socially drink   Drug use: No       Allergies  Allergen Reactions   Duricef [Cefadroxil] Shortness Of Breath and Swelling   Tape Rash    Current Facility-Administered Medications  Medication Dose Route Frequency Provider Last Rate Last Dose   0.9 %  sodium chloride infusion   Intravenous Continuous Reubin Milan, MD 125 mL/hr at 02/05/19 1611     acetaminophen (TYLENOL) tablet 650 mg  650 mg Oral Q6H PRN Reubin Milan, MD       Or   acetaminophen (TYLENOL) suppository 650 mg  650 mg Rectal Q6H PRN Reubin Milan, MD       albuterol (PROVENTIL) (2.5 MG/3ML) 0.083% nebulizer solution 2.5 mg  2.5 mg Nebulization Q4H PRN Reubin Milan, MD       aztreonam (AZACTAM) 0.5 g in dextrose 5 % 50 mL IVPB  0.5 g Intravenous Q8H Emiliano Dyer, RPH   Stopped at 02/05/19 1256   enoxaparin (LOVENOX) injection 30 mg  30 mg Subcutaneous Q24H Candiss Norse A, PA-C       feeding supplement (ENSURE ENLIVE) (ENSURE ENLIVE) liquid 237 mL  237 mL Oral BID BM Reubin Milan, MD   237 mL at 02/05/19 1003   HYDROmorphone (DILAUDID) injection 1 mg  1 mg Intravenous Q4H PRN Rai, Ripudeep K, MD       insulin aspart (novoLOG) injection 0-9 Units  0-9 Units Subcutaneous TID WC Reubin Milan, MD   2 Units at 02/05/19 1145   insulin glargine (LANTUS) injection 30 Units  30 Units Subcutaneous q morning - 10a Reubin Milan, MD   30 Units at 02/05/19 1004   lidocaine (LIDODERM) 5 % 1 patch  1 patch Transdermal Q24H Rai, Ripudeep K, MD   1 patch at 02/05/19 1145   ondansetron (ZOFRAN) tablet 4 mg  4 mg Oral Q6H PRN Reubin Milan, MD       Or   ondansetron [facility]) injection 4 mg  4 mg Intravenous Q6H PRN Reubin Milan, MD   4 mg at 02/05/19 0825   oxyCODONE-acetaminophen (PERCOCET/ROXICET) 5-325 MG per tablet 1-2 tablet  1-2 tablet Oral Q4H PRN Rai, Vernelle Emerald, MD   2 tablet at 02/05/19 1229    OBJECTIVE: Middle-aged white woman sitting at the edge of the bed  Vitals:   02/05/19 0615 02/05/19 1327  BP: 137/72 105/63  Pulse: 78 77  Resp: 19 16  Temp: 98.1 F (36.7 C) 98.4 F (36.9 C)  SpO2: 100% 100%     Body mass index is 23.62 kg/m.   Wt Readings from Last 3 Encounters:  02/04/19 125 lb (56.7 kg)  12/28/18 125 lb (56.7 kg)  12/28/18 127 lb (57.6 kg)      ECOG FS:1 - Symptomatic but completely ambulatory  Pandemic distance maintained through the visit today  LAB RESULTS:  CMP     Component Value Date/Time   NA 131 (L) 02/04/2019 1721   NA 136 12/22/2018 1434   K 4.1 02/04/2019 1721   CL 95 (L) 02/04/2019 1721   CO2 17 (L) 02/04/2019 1721   GLUCOSE 245 (H) 02/04/2019 1721   BUN 93 (H) 02/04/2019 1721   BUN 22 12/22/2018 1434   CREATININE 2.59 (H) 02/04/2019 1721   CALCIUM 9.6 02/04/2019 1721   PROT 7.8 02/04/2019 1721   PROT 6.6 12/22/2018 1434   ALBUMIN 4.1 02/04/2019 1721   ALBUMIN 4.1 12/22/2018 1434   AST 15 02/04/2019 1721   ALT 18 02/04/2019 1721   ALKPHOS 84 02/04/2019 1721   BILITOT 0.5 02/04/2019 1721   BILITOT <0.2 12/22/2018 1434   GFRNONAA 18 (L) 02/04/2019 1721   GFRAA 21 (L) 02/04/2019 1721    No results found for: TOTALPROTELP, ALBUMINELP, A1GS, A2GS, BETS, BETA2SER, GAMS, MSPIKE, SPEI  No results found for: KPAFRELGTCHN, LAMBDASER, KAPLAMBRATIO  Lab Results  Component Value Date   WBC 13.2 (H) 02/04/2019   NEUTROABS 4.6  09/16/2007   HGB 14.0 02/04/2019   HCT 43.2 02/04/2019   MCV 90.0 02/04/2019   PLT 407 (H) 02/04/2019    _0 @  No results found for: LABCA2  No components found for: XBDZHG992  Recent Labs  Lab 02/04/19 2213  INR 1.1    No results found for: LABCA2  No results found for: EQA834  No results found for: HDQ222  No results found for: LNL892  No results found for: CA2729  No components found for: HGQUANT  No results found for: CEA1 / No results found for: CEA1   No results found for: AFPTUMOR  No results found for: CHROMOGRNA  No results found for: PSA1  Admission on 02/04/2019  Component Date Value Ref Range Status   WBC 02/04/2019 13.2* 4.0 - 10.5 K/uL Final   RBC 02/04/2019 4.80  3.87 - 5.11 MIL/uL Final   Hemoglobin 02/04/2019 14.0  12.0 - 15.0 g/dL Final   HCT 02/04/2019 43.2  36.0 - 46.0 % Final   MCV 02/04/2019 90.0  80.0 - 100.0 fL Final   MCH 02/04/2019 29.2  26.0 - 34.0 pg Final   MCHC 02/04/2019 32.4  30.0 - 36.0 g/dL Final   RDW 02/04/2019 14.6  11.5 - 15.5 % Final   Platelets 02/04/2019 407* 150 - 400 K/uL Final   nRBC 02/04/2019 0.0  0.0 - 0.2 % Final   Performed at [facility], [address], [place], [place] [address]   Sodium 02/04/2019 131* 135 - 145 mmol/L Final   Potassium 02/04/2019 4.1  3.5 - 5.1 mmol/L Final   Chloride 02/04/2019 95* 98 - 111 mmol/L Final   CO2 02/04/2019 17* 22 - 32 mmol/L Final   Glucose, Bld 02/04/2019 245* 70 - 99 mg/dL Final   BUN 02/04/2019 93* 8 - 23 mg/dL Final   Creatinine, Ser 02/04/2019 2.59* 0.44 - 1.00 mg/dL Final  Calcium 02/04/2019 9.6  8.9 - 10.3 mg/dL Final   Total Protein 02/04/2019 7.8  6.5 - 8.1 g/dL Final   Albumin 02/04/2019 4.1  3.5 - 5.0 g/dL Final   AST 02/04/2019 15  15 - 41 U/L Final   ALT 02/04/2019 18  0 - 44 U/L Final   Alkaline Phosphatase 02/04/2019 84  38 - 126 U/L Final   Total Bilirubin 02/04/2019 0.5  0.3 - 1.2 mg/dL Final   GFR calc non Af  Amer 02/04/2019 18* >60 mL/min Final   GFR calc Af Amer 02/04/2019 21* >60 mL/min Final   Anion gap 02/04/2019 19* 5 - 15 Final   Performed at [facility], [address], [place], [place] [address]   Lipase 02/04/2019 33  11 - 51 U/L Final   Performed at [facility], [address], [place], [place] [address]   Color, Urine 02/04/2019 YELLOW  YELLOW Final   APPearance 02/04/2019 CLOUDY* CLEAR Final   Specific Gravity, Urine 02/04/2019 1.015  1.005 - 1.030 Final   pH 02/04/2019 5.0  5.0 - 8.0 Final   Glucose, UA 02/04/2019 NEGATIVE  NEGATIVE mg/dL Final   Hgb urine dipstick 02/04/2019 SMALL* NEGATIVE Final   Bilirubin Urine 02/04/2019 NEGATIVE  NEGATIVE Final   Ketones, ur 02/04/2019 5* NEGATIVE mg/dL Final   Protein, ur 02/04/2019 100* NEGATIVE mg/dL Final   Nitrite 02/04/2019 NEGATIVE  NEGATIVE Final   Leukocytes,Ua 02/04/2019 LARGE* NEGATIVE Final   RBC / HPF 02/04/2019 21-50  0 - 5 RBC/hpf Final   WBC, UA 02/04/2019 >50* 0 - 5 WBC/hpf Final   Bacteria, UA 02/04/2019 MANY* NONE SEEN Final   Squamous Epithelial / LPF 02/04/2019 21-50  0 - 5 Final   WBC Clumps 02/04/2019 PRESENT   Final   Mucus 02/04/2019 PRESENT   Final   Non Squamous Epithelial 02/04/2019 0-5* NONE SEEN Final   Performed at [facility], [address], [place], [place] [address]   Glucose-Capillary 02/04/2019 218* 70 - 99 mg/dL Final   Osmolality, Ur 02/04/2019 361  300 - 900 mOsm/kg Final   Performed at [facility], [place] [address], [place], [place] [address]   Sodium, Ur 02/04/2019 29  mmol/L Final   Performed at [facility], [address], [place], [place] [address]   Prothrombin Time 02/04/2019 14.1  11.4 - 15.2 seconds Final   INR 02/04/2019 1.1  0.8 - 1.2 Final   Comment: (NOTE) INR goal varies based on device and disease states. Performed at [facility], [address], [place], [place] [address]    Lactic Acid, Venous 02/04/2019 2.5* 0.5 - 1.9 mmol/L Final   Comment:  CRITICAL RESULT CALLED TO, READ BACK BY AND VERIFIED WITH: DANIELS,B  Performed at [facility], [address], [place], [place] [address]    Glucose-Capillary 02/04/2019 170* 70 - 99 mg/dL Final   Comment 1 02/04/2019 Notify RN   Final   Comment 2 02/04/2019 Document in Chart   Final   Lactic Acid, Venous 02/05/2019 1.2  0.5 - 1.9 mmol/L Final   Performed at [facility], [place] [address], [place], [place] [address]   Glucose-Capillary 02/05/2019 112* 70 - 99 mg/dL Final   Glucose-Capillary 02/05/2019 196* 70 - 99 mg/dL Final   Glucose-Capillary 02/05/2019 48* 70 - 99 mg/dL Final    (this displays the last labs from the last 3 days)  No results found for: TOTALPROTELP, ALBUMINELP, A1GS, A2GS, BETS, BETA2SER, GAMS, MSPIKE, SPEI (this displays SPEP labs)  No results found for: KPAFRELGTCHN, LAMBDASER, KAPLAMBRATIO (kappa/lambda light chains)  No results found for: HGBA, HGBA2QUANT, HGBFQUANT, HGBSQUAN (Hemoglobinopathy evaluation)   No results found for: LDH  No results found for: IRON, TIBC, IRONPCTSAT (Iron and TIBC)  No results found for: FERRITIN  Urinalysis    Component Value Date/Time   COLORURINE YELLOW 02/04/2019 1939   APPEARANCEUR CLOUDY (A) 02/04/2019 1939   LABSPEC 1.015 02/04/2019 1939   PHURINE 5.0 02/04/2019 1939   GLUCOSEU NEGATIVE 02/04/2019 1939   HGBUR SMALL (A) 02/04/2019 1939   BILIRUBINUR NEGATIVE 02/04/2019 1939   KETONESUR 5 (A) 02/04/2019 1939   PROTEINUR 100 (A) 02/04/2019 1939   UROBILINOGEN 1.0 09/16/2007 1009   NITRITE NEGATIVE 02/04/2019 1939   LEUKOCYTESUR LARGE (A) 02/04/2019 1939     STUDIES: Dg Chest 2 View  Result Date: 02/05/2019 CLINICAL DATA:  Pancreatic mass EXAM: CHEST - 2 VIEW COMPARISON:  10/24/2008 FINDINGS: There are ill-defined focal opacities at the right lung base which may be related to the ribs. Underlying lung nodules are not excluded. Lungs are hyperaerated with interstitial prominence.  Normal heart size. IMPRESSION: Possible right basilar lung nodules.  CT is warranted. Electronically Signed   By: Marybelle Killings M.D.   On: 02/05/2019 14:50   Mr Thoracic Spine Wo Contrast  Result Date: 02/04/2019 CLINICAL DATA:  Acute midline thoracic back pain. Chronic bilateral lower back pain without radiculopathy. EXAM: MRI THORACIC AND LUMBAR SPINE WITHOUT CONTRAST TECHNIQUE: Multiplanar and multiecho pulse sequences of the thoracic and lumbar spine were obtained without intravenous contrast. COMPARISON:  Thoracic and lumbar spine x-rays dated December 28, 2018. FINDINGS: MRI THORACIC SPINE FINDINGS Alignment:  Physiologic. Vertebrae: No fracture, evidence of discitis, or bone lesion. Cord:  Normal signal and morphology. Paraspinal and other soft tissues: There are a few small pulmonary nodules in the right upper lobe measuring up to 5 mm. Otherwise negative. Disc levels: No significant disc bulge or herniation. No spinal canal or neuroforaminal stenosis. MRI LUMBAR SPINE FINDINGS Segmentation:  Standard. Alignment:  Physiologic. Vertebrae:  No fracture, evidence of discitis, or bone lesion. Conus medullaris and cauda equina: Conus extends to the L1-L2 level. Conus and cauda equina appear normal. Paraspinal and other soft tissues: Negative. Disc levels: T12-L1 to L3-L4: No significant disc bulge or herniation. No spinal canal or neuroforaminal stenosis. L4-L5: Minimal disc bulging. Mild right and minimal left facet arthropathy. No stenosis. L5-S1: Negative disc. Mild bilateral facet arthropathy. No stenosis. IMPRESSION: Thoracic spine: 1. No acute osseous abnormality or significant degenerative changes. 2. There are few small pulmonary nodules in the right upper lobe measuring up to 5 mm. Recommend non-emergent CT chest without contrast for further evaluation. Lumbar spine: 1.  No acute osseous abnormality. 2. Mild lower lumbar facet arthropathy.  No stenosis or impingement. Electronically Signed   By: Titus Dubin M.D.   On: 02/04/2019 17:22   Mr Lumbar Spine Wo Contrast  Result Date: 02/04/2019 CLINICAL DATA:  Acute midline thoracic back pain. Chronic bilateral lower back pain without radiculopathy. EXAM: MRI THORACIC AND LUMBAR SPINE WITHOUT CONTRAST TECHNIQUE: Multiplanar and multiecho pulse sequences of the thoracic and lumbar spine were obtained without intravenous contrast. COMPARISON:  Thoracic and lumbar spine x-rays dated December 28, 2018. FINDINGS: MRI THORACIC SPINE FINDINGS Alignment:  Physiologic. Vertebrae: No fracture, evidence of discitis, or bone lesion. Cord:  Normal signal and morphology. Paraspinal and other soft tissues: There are a few small pulmonary nodules in the right upper lobe measuring up to 5 mm. Otherwise negative.  Disc levels: No significant disc bulge or herniation. No spinal canal or neuroforaminal stenosis. MRI LUMBAR SPINE FINDINGS Segmentation:  Standard. Alignment:  Physiologic. Vertebrae:  No fracture, evidence of discitis, or bone lesion. Conus medullaris and cauda equina: Conus extends to the L1-L2 level. Conus and cauda equina appear normal. Paraspinal and other soft tissues: Negative. Disc levels: T12-L1 to L3-L4: No significant disc bulge or herniation. No spinal canal or neuroforaminal stenosis. L4-L5: Minimal disc bulging. Mild right and minimal left facet arthropathy. No stenosis. L5-S1: Negative disc. Mild bilateral facet arthropathy. No stenosis. IMPRESSION: Thoracic spine: 1. No acute osseous abnormality or significant degenerative changes. 2. There are few small pulmonary nodules in the right upper lobe measuring up to 5 mm. Recommend non-emergent CT chest without contrast for further evaluation. Lumbar spine: 1.  No acute osseous abnormality. 2. Mild lower lumbar facet arthropathy.  No stenosis or impingement. Electronically Signed   By: Titus Dubin M.D.   On: 02/04/2019 17:22   Ct Renal Stone Study  Result Date: 02/04/2019 CLINICAL DATA:  Low back pain  with nausea for 4 months. History of left breast cancer and diabetes EXAM: CT ABDOMEN AND PELVIS WITHOUT CONTRAST TECHNIQUE: Multidetector CT imaging of the abdomen and pelvis was performed following the standard protocol without IV contrast. COMPARISON:  None. FINDINGS: Lower chest: Mild emphysema and scattered scarring in both lung bases. No significant pleural or pericardial effusion. There is a small hiatal hernia. Hepatobiliary: There are several ill-defined attic lesions which are highly suspicious for metastatic disease. Largest lesion is located inferiorly in the right hepatic lobe (segment 6), measuring 2.4 x 2.1 cm on image 20/2. There is a small gallstone. No evidence of gallbladder wall thickening, surrounding inflammation or biliary dilatation. Pancreas: There is a large mass involving the pancreatic body and tail, measuring up to 6.3 x 5.2 cm on image 16/2. This partially encases the celiac trunk and is highly suspicious for pancreatic adenocarcinoma. Spleen: Normal in size without focal abnormality. Adrenals/Urinary Tract: Minimal nodularity of both adrenal glands, nonspecific. There are several nonobstructing small right renal calculi. No evidence of ureteral calculus or hydronephrosis. Probable pelvic floor laxity. The bladder otherwise appears normal. Stomach/Bowel: No evidence of bowel wall thickening, distention or surrounding inflammatory change. Moderate diverticular changes in the distal colon. The appendix appears normal. Vascular/Lymphatic: There are several mildly enlarged lymph nodes in the upper retroperitoneum adjacent to the pancreatic mass, suspicious for metastatic disease. Largest is a left periaortic node measuring 15 mm on image 21/2. There is mild aortic and branch vessel atherosclerosis. As above, the pancreatic mass partially encases the celiac trunk as well as the splenic vessels. Reproductive: The uterus and ovaries appear normal. No adnexal mass. Probable pelvic floor laxity.  Other: No ascites or peritoneal nodular postsurgical changes in the low anterior abdominal wall. Musculoskeletal: There is deformity of the left pelvic bones which may be developmental or posttraumatic. No acute osseous findings or signs of osseous metastatic disease. IMPRESSION: 1. Large mass involving the pancreatic body and tail with probable vascular encasement, adjacent adenopathy and hepatic metastases consistent with metastatic pancreatic adenocarcinoma. Tissue sampling recommended. 2. Cholelithiasis without evidence of cholecystitis or biliary dilatation. 3. Nonobstructing right renal calculi. 4. Prominent distal colonic diverticulosis without acute inflammation. 5. No clear explanation for back pain. 6.  Aortic Atherosclerosis (ICD10-I70.0). Electronically Signed   By: Richardean Sale M.D.   On: 02/04/2019 20:18   [place] Ekg Site Rite  Result Date: 02/05/2019 If Site Rite image not attached, placement could  not be confirmed due to current cardiac rhythm.    ASSESSMENT: 73 y.o. 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  Unfortunately there is a significant history of pancreatic cancer in her family and I do believe she warrants genetics testing.  She understands also that our working diagnosis is stage IV pancreatic cancer.  She understands if this is the case, it is not curable, but it is treatable.  My partner Dr. Benay Spice is with our gastrointestinal  oncology group and is well versed in the prognosis and treatment of pancreatic cancer.  He happens to be on-call tomorrow and he will see the patient at that time to move this along.  Please let me know if I can be of further help at this point    Kristine Cruel, MD   02/05/2019 5:32 PM Medical Oncology and Hematology [facility] [place], [place] [address] Tel. [phone]    Fax. [phone]

## 2019-02-05 NOTE — Progress Notes (Signed)
Pharmacy Antibiotic Note  Kristine Rodriguez is a 73 y.o. female admitted on 02/04/2019 with UTI.  Pharmacy has been consulted for Aztreonam dosing after patient had a localized reaction to Levaquin.  Afebrile WBC 13.2 SCr 2.59, CrCl ~15 ml/min Lactate 2.5 > 1.2  Plan: Aztreonam 1g given at 02:41 this AM, continue with 500mg  IV q8h F/U cx and clinical progress  Height: 5\' 1"  (154.9 cm) Weight: 125 lb (56.7 kg) IBW/kg (Calculated) : 47.8  Temp (24hrs), Avg:97.9 F (36.6 C), Min:97.6 F (36.4 C), Max:98.1 F (36.7 C)  Recent Labs  Lab 02/04/19 1721 02/04/19 2213 02/05/19 0536  WBC 13.2*  --   --   CREATININE 2.59*  --   --   LATICACIDVEN  --  2.5* 1.2    Estimated Creatinine Clearance: 15 mL/min (A) (by C-G formula based on SCr of 2.59 mg/dL (H)).    Allergies  Allergen Reactions  . Duricef [Cefadroxil] Shortness Of Breath and Swelling  . Tape Rash    Antimicrobials this admission: Levaquin 4/24 x 1 dose >>  Aztreonam 4/25 >>  Dose adjustments this admission: n/a  Microbiology results: 4/24 UCx: pending  Thank you for allowing pharmacy to be a part of this patient's care.  Peggyann Juba, PharmD, BCPS Pager: [phone] 02/05/2019 8:56 AM

## 2019-02-05 NOTE — Progress Notes (Signed)
Request to IR for liver lesion biopsy -- imaging has been reviewed by Dr. Kathlene Cote who approves patient for US guided liver lesion biopsy tentatively planned for Monday 4/27.  I will place orders for NPO after midnight on 4/27, no heparin/lovenox after AM of 4/26 and AM labs on 4/27. Full consult/consent to follow.  Please call IR with questions or concerns.  Candiss Norse, PA-C Pager# [phone]

## 2019-02-05 NOTE — Progress Notes (Signed)
[facility]                                                                              Patient Demographics  Kristine Rodriguez, is a 73 y.o. female, DOB - May 10, 1946, LDJ:[id number]  Admit date - 02/04/2019   Admitting Physician Reubin Milan, MD  Outpatient Primary MD for the patient is Sharilyn Sites, MD  Outpatient specialists:   LOS - 1  days   Medical records reviewed and are as summarized below:    Chief Complaint  Patient presents with  . Back Pain       Brief summary   Patient is a 73 year old female with history of breast cancer, type 2 diabetes, hypertension, hyperlipidemia, tobacco use presented to ED with progressively worsening mid back pain since December last year.  Per patient, since then she has had decreased appetite, nausea, weight loss, dizziness, generalized weakness.  Denied any melena but occasional mild hematochezia from hemorrhoids.  She also reported complaints of decreased urination and volume.  No dysuria or flank pain or fevers or chills.  Patient had been waiting to get an MRI thoracic and lumbar spine outpatient, delayed due to COVID-19 pandemic. X-ray of the thoracic and lumbar spine in March 2020 had shown possible compression fracture of T9  In ED, CO2 17, glucose 245, BUN 93, creatinine 2.59, baseline 0.96 in March 2020, lactic acidosis with lactic acid 2.5, WBCs 13.2.  UA positive for UTI and ketones. CT renal stone study showed pancreatic mass with hepatic metastasis   Assessment & Plan    Principal Problem:   Pancreatic mass, new diagnosis with hepatic metastasis -CT of the renal stone study showed large mass involving the pancreatic body and tail with probable vascular encasement, adjacent adenopathy and hepatic metastasis consistent with metastatic pancreatic adenocarcinoma.  Cholelithiasis without evidence of cholecystitis or biliary dilatation. -Discussed with the patient, she reports history of pancreatic cancer in her  mother and deceased sister -Follow CEA, CA 19-9 -Discussed in detail with oncology, Dr. Jana Hakim, who recommended IR consult for tissue diagnosis, baseline chest x-ray.  Likely does not need ERCP or CT pancreas as patient has clear diagnosis of pancreatic mass.  Will follow recommendations.  Per oncology, does not need CT chest, recommended baseline chest x-ray -IR consulted if liver biopsy is possible  Active Problems: Sepsis secondary to UTI -Patient met sepsis criteria at the time of admission with acute kidney injury, UTI, multiple metabolic abnormalities, lactic acidosis -Follow urine culture and sensitivities, for now continue aztreonam, has a history of allergies to cefadroxil and apparently had a local reaction to Levaquin in ED  Acute kidney injury with lactic acidosis -Creatinine 2.59 at the time of admission, 0.96 baseline in March 2020 -Continue IV fluid hydration, no obstructive uropathy. -Lactic acid improving.  Follow bmet -Hold metformin  Hyponatremia -Likely due to  sepsis, HCTZ and dehydration -Continue IV fluid hydration, follow bmet     Uncontrolled type 2 diabetes mellitus with hyperglycemia (HCC) -Hold metformin, continue Lantus, sliding scale insulin -Hemoglobin A1c 8.5 on 12/22/2018, follows endocrinology outpatient    Essential hypertension, benign -BP currently soft, continue to hold valsartan HCTZ  Acute on chronic back pain -Likely due to #1, MRI of the thoracic spine and lumbar spine showed no abnormality or degenerative changes, no compression fracture. -Feels miserable, placed on Dilaudid for severe pain and Percocet for moderate pain, Lidoderm patch   Code Status: Full code DVT Prophylaxis:  Lovenox, will hold tomorrow for the liver biopsy on Monday Family Communication: Discussed in detail with the patient, all imaging results, lab results explained to the patient   Disposition Plan: Liver biopsy planned on Monday  Time Spent in minutes   35  minutes  Procedures:  CT renal stone study MRI of the thoracic and lumbar spine  Consultants:   Interventional radiology Oncology, Dr. Jana Hakim  Antimicrobials:   Anti-infectives (From admission, onward)   Start     Dose/Rate Route Frequency Ordered Stop   02/06/19 2200  levofloxacin (LEVAQUIN) IVPB 500 mg  Status:  Discontinued     500 mg 100 mL/hr over 60 Minutes Intravenous Every 48 hours 02/04/19 2128 02/05/19 0314   02/05/19 1200  aztreonam (AZACTAM) 0.5 g in dextrose 5 % 50 mL IVPB     0.5 g 100 mL/hr over 30 Minutes Intravenous Every 8 hours 02/05/19 0859     02/04/19 2330  aztreonam (AZACTAM) 1 g in sodium chloride 0.9 % 100 mL IVPB     1 g 200 mL/hr over 30 Minutes Intravenous  Once 02/04/19 2324 02/05/19 0311   02/04/19 2130  levofloxacin (LEVAQUIN) IVPB 500 mg     500 mg 100 mL/hr over 60 Minutes Intravenous  Once 02/04/19 2119 02/04/19 2220   02/04/19 2045  sulfamethoxazole-trimethoprim (BACTRIM DS) 800-160 MG per tablet 1 tablet  Status:  Discontinued     1 tablet Oral  Once 02/04/19 2041 02/04/19 2103         Medications  Scheduled Meds: . enoxaparin (LOVENOX) injection  30 mg Subcutaneous Q24H  . feeding supplement (ENSURE ENLIVE)  237 mL Oral BID BM  . insulin aspart  0-9 Units Subcutaneous TID WC  . insulin glargine  30 Units Subcutaneous q morning - 10a   Continuous Infusions: . sodium chloride Stopped (02/05/19 0241)  . aztreonam     PRN Meds:.acetaminophen **OR** acetaminophen, albuterol, ondansetron **OR** ondansetron (ZOFRAN) IV, traMADol      Subjective:   Kristine Rodriguez was seen and examined today.  Feels miserable with back pain, holding her left flank area during encounter, 7/10 radiating to the back.  No nausea or vomiting. Patient denies dizziness, chest pain, shortness of breath,  new weakness, numbess, tingling. No fevers.  Objective:   Vitals:   02/04/19 2233 02/04/19 2245 02/05/19 0004 02/05/19 0615  BP: 116/68 (!) 106/54 (!)  98/56 137/72  Pulse:   92 78  Resp: (!) 23  16 19   Temp:   97.6 F (36.4 C) 98.1 F (36.7 C)  TempSrc:   Oral Oral  SpO2:   99% 100%  Weight:      Height:        Intake/Output Summary (Last 24 hours) at 02/05/2019 1047 Last data filed at 02/05/2019 1005 Gross per 24 hour  Intake 2323.18 ml  Output 950 ml  Net 1373.18 ml     Wt Readings from Last 3 Encounters:  02/04/19 56.7 kg  12/28/18 56.7 kg  12/28/18 57.6 kg     Exam  General: Alert and oriented x 3, NAD  Eyes:   HEENT:  Atraumatic, normocephalic  Cardiovascular: S1 S2 auscultated, Regular rate and rhythm.  Respiratory: Clear to auscultation  bilaterally, no wheezing, rales or rhonchi  Gastrointestinal: Soft, TTP in LUQ and flank area, nondistended, + bowel sounds  Ext: no pedal edema bilaterally  Neuro: No new deficit  Musculoskeletal: No digital cyanosis, clubbing  Skin: No rashes  Psych: Normal affect and demeanor, alert and oriented x3    Data Reviewed:  I have personally reviewed following labs and imaging studies  Micro Results No results found for this or any previous visit (from the past 240 hour(s)).  Radiology Reports Mr Thoracic Spine Wo Contrast  Result Date: 02/04/2019 CLINICAL DATA:  Acute midline thoracic back pain. Chronic bilateral lower back pain without radiculopathy. EXAM: MRI THORACIC AND LUMBAR SPINE WITHOUT CONTRAST TECHNIQUE: Multiplanar and multiecho pulse sequences of the thoracic and lumbar spine were obtained without intravenous contrast. COMPARISON:  Thoracic and lumbar spine x-rays dated December 28, 2018. FINDINGS: MRI THORACIC SPINE FINDINGS Alignment:  Physiologic. Vertebrae: No fracture, evidence of discitis, or bone lesion. Cord:  Normal signal and morphology. Paraspinal and other soft tissues: There are a few small pulmonary nodules in the right upper lobe measuring up to 5 mm. Otherwise negative. Disc levels: No significant disc bulge or herniation. No spinal canal or  neuroforaminal stenosis. MRI LUMBAR SPINE FINDINGS Segmentation:  Standard. Alignment:  Physiologic. Vertebrae:  No fracture, evidence of discitis, or bone lesion. Conus medullaris and cauda equina: Conus extends to the L1-L2 level. Conus and cauda equina appear normal. Paraspinal and other soft tissues: Negative. Disc levels: T12-L1 to L3-L4: No significant disc bulge or herniation. No spinal canal or neuroforaminal stenosis. L4-L5: Minimal disc bulging. Mild right and minimal left facet arthropathy. No stenosis. L5-S1: Negative disc. Mild bilateral facet arthropathy. No stenosis. IMPRESSION: Thoracic spine: 1. No acute osseous abnormality or significant degenerative changes. 2. There are few small pulmonary nodules in the right upper lobe measuring up to 5 mm. Recommend non-emergent CT chest without contrast for further evaluation. Lumbar spine: 1.  No acute osseous abnormality. 2. Mild lower lumbar facet arthropathy.  No stenosis or impingement. Electronically Signed   By: Titus Dubin M.D.   On: 02/04/2019 17:22   Mr Lumbar Spine Wo Contrast  Result Date: 02/04/2019 CLINICAL DATA:  Acute midline thoracic back pain. Chronic bilateral lower back pain without radiculopathy. EXAM: MRI THORACIC AND LUMBAR SPINE WITHOUT CONTRAST TECHNIQUE: Multiplanar and multiecho pulse sequences of the thoracic and lumbar spine were obtained without intravenous contrast. COMPARISON:  Thoracic and lumbar spine x-rays dated December 28, 2018. FINDINGS: MRI THORACIC SPINE FINDINGS Alignment:  Physiologic. Vertebrae: No fracture, evidence of discitis, or bone lesion. Cord:  Normal signal and morphology. Paraspinal and other soft tissues: There are a few small pulmonary nodules in the right upper lobe measuring up to 5 mm. Otherwise negative. Disc levels: No significant disc bulge or herniation. No spinal canal or neuroforaminal stenosis. MRI LUMBAR SPINE FINDINGS Segmentation:  Standard. Alignment:  Physiologic. Vertebrae:  No  fracture, evidence of discitis, or bone lesion. Conus medullaris and cauda equina: Conus extends to the L1-L2 level. Conus and cauda equina appear normal. Paraspinal and other soft tissues: Negative. Disc levels: T12-L1 to L3-L4: No significant disc bulge or herniation. No spinal canal or neuroforaminal stenosis. L4-L5: Minimal disc bulging. Mild right and minimal left facet arthropathy. No stenosis. L5-S1: Negative disc. Mild bilateral facet arthropathy. No stenosis. IMPRESSION: Thoracic spine: 1. No acute osseous abnormality or significant degenerative changes. 2. There are few small pulmonary nodules in the right upper lobe measuring up to 5 mm. Recommend non-emergent CT chest without  contrast for further evaluation. Lumbar spine: 1.  No acute osseous abnormality. 2. Mild lower lumbar facet arthropathy.  No stenosis or impingement. Electronically Signed   By: Titus Dubin M.D.   On: 02/04/2019 17:22   Ct Renal Stone Study  Result Date: 02/04/2019 CLINICAL DATA:  Low back pain with nausea for 4 months. History of left breast cancer and diabetes EXAM: CT ABDOMEN AND PELVIS WITHOUT CONTRAST TECHNIQUE: Multidetector CT imaging of the abdomen and pelvis was performed following the standard protocol without IV contrast. COMPARISON:  None. FINDINGS: Lower chest: Mild emphysema and scattered scarring in both lung bases. No significant pleural or pericardial effusion. There is a small hiatal hernia. Hepatobiliary: There are several ill-defined attic lesions which are highly suspicious for metastatic disease. Largest lesion is located inferiorly in the right hepatic lobe (segment 6), measuring 2.4 x 2.1 cm on image 20/2. There is a small gallstone. No evidence of gallbladder wall thickening, surrounding inflammation or biliary dilatation. Pancreas: There is a large mass involving the pancreatic body and tail, measuring up to 6.3 x 5.2 cm on image 16/2. This partially encases the celiac trunk and is highly suspicious  for pancreatic adenocarcinoma. Spleen: Normal in size without focal abnormality. Adrenals/Urinary Tract: Minimal nodularity of both adrenal glands, nonspecific. There are several nonobstructing small right renal calculi. No evidence of ureteral calculus or hydronephrosis. Probable pelvic floor laxity. The bladder otherwise appears normal. Stomach/Bowel: No evidence of bowel wall thickening, distention or surrounding inflammatory change. Moderate diverticular changes in the distal colon. The appendix appears normal. Vascular/Lymphatic: There are several mildly enlarged lymph nodes in the upper retroperitoneum adjacent to the pancreatic mass, suspicious for metastatic disease. Largest is a left periaortic node measuring 15 mm on image 21/2. There is mild aortic and branch vessel atherosclerosis. As above, the pancreatic mass partially encases the celiac trunk as well as the splenic vessels. Reproductive: The uterus and ovaries appear normal. No adnexal mass. Probable pelvic floor laxity. Other: No ascites or peritoneal nodular postsurgical changes in the low anterior abdominal wall. Musculoskeletal: There is deformity of the left pelvic bones which may be developmental or posttraumatic. No acute osseous findings or signs of osseous metastatic disease. IMPRESSION: 1. Large mass involving the pancreatic body and tail with probable vascular encasement, adjacent adenopathy and hepatic metastases consistent with metastatic pancreatic adenocarcinoma. Tissue sampling recommended. 2. Cholelithiasis without evidence of cholecystitis or biliary dilatation. 3. Nonobstructing right renal calculi. 4. Prominent distal colonic diverticulosis without acute inflammation. 5. No clear explanation for back pain. 6.  Aortic Atherosclerosis (ICD10-I70.0). Electronically Signed   By: Richardean Sale M.D.   On: 02/04/2019 20:18    Lab Data:  CBC: Recent Labs  Lab 02/04/19 1721  WBC 13.2*  HGB 14.0  HCT 43.2  MCV 90.0  PLT 407*    Basic Metabolic Panel: Recent Labs  Lab 02/04/19 1721  NA 131*  K 4.1  CL 95*  CO2 17*  GLUCOSE 245*  BUN 93*  CREATININE 2.59*  CALCIUM 9.6   GFR: Estimated Creatinine Clearance: 15 mL/min (A) (by C-G formula based on SCr of 2.59 mg/dL (H)). Liver Function Tests: Recent Labs  Lab 02/04/19 1721  AST 15  ALT 18  ALKPHOS 84  BILITOT 0.5  PROT 7.8  ALBUMIN 4.1   Recent Labs  Lab 02/04/19 1721  LIPASE 33   No results for input(s): AMMONIA in the last 168 hours. Coagulation Profile: Recent Labs  Lab 02/04/19 2213  INR 1.1   Cardiac Enzymes: No  results for input(s): CKTOTAL, CKMB, CKMBINDEX, TROPONINI in the last 168 hours. BNP (last 3 results) No results for input(s): PROBNP in the last 8760 hours. HbA1C: No results for input(s): HGBA1C in the last 72 hours. CBG: Recent Labs  Lab 02/04/19 1943 02/04/19 2359 02/05/19 0725  GLUCAP 218* 170* 112*   Lipid Profile: No results for input(s): CHOL, HDL, LDLCALC, TRIG, CHOLHDL, LDLDIRECT in the last 72 hours. Thyroid Function Tests: No results for input(s): TSH, T4TOTAL, FREET4, T3FREE, THYROIDAB in the last 72 hours. Anemia Panel: No results for input(s): VITAMINB12, FOLATE, FERRITIN, TIBC, IRON, RETICCTPCT in the last 72 hours. Urine analysis:    Component Value Date/Time   COLORURINE YELLOW 02/04/2019 1939   APPEARANCEUR CLOUDY (A) 02/04/2019 1939   LABSPEC 1.015 02/04/2019 1939   PHURINE 5.0 02/04/2019 1939   GLUCOSEU NEGATIVE 02/04/2019 1939   HGBUR SMALL (A) 02/04/2019 1939   BILIRUBINUR NEGATIVE 02/04/2019 1939   KETONESUR 5 (A) 02/04/2019 1939   PROTEINUR 100 (A) 02/04/2019 1939   UROBILINOGEN 1.0 09/16/2007 1009   NITRITE NEGATIVE 02/04/2019 1939   LEUKOCYTESUR LARGE (A) 02/04/2019 1939       M.D. [facility] 02/05/2019, 10:47 AM  Pager: [phone] Between 7am to 7pm - call Pager - [phone]  After 7pm go to [email or web address] - password [email or web address]  Call night coverage person  covering after 7pm

## 2019-02-05 NOTE — Progress Notes (Signed)
Spoke with Wyatt Portela RN re PICC order.  States PICC was requested by lab tech this am due to difficulty obtaining labs. Pt has working PIV at this time.  RN notified PICC to be placed this pm or 02/06/19.

## 2019-02-05 NOTE — Progress Notes (Signed)
Hypoglycemic Event  CBG: 48  Treatment: 8 oz of orange Juice  Symptoms: None   Follow-up CBG: Time: 1520 CBG Result: 125  Possible Reasons for Event: Poor oral intake  Comments/MD notified: Dr. Roanna Epley

## 2019-02-06 DIAGNOSIS — M955 Acquired deformity of pelvis: Secondary | ICD-10-CM

## 2019-02-06 DIAGNOSIS — R918 Other nonspecific abnormal finding of lung field: Secondary | ICD-10-CM

## 2019-02-06 DIAGNOSIS — J439 Emphysema, unspecified: Secondary | ICD-10-CM

## 2019-02-06 DIAGNOSIS — R590 Localized enlarged lymph nodes: Secondary | ICD-10-CM

## 2019-02-06 LAB — CEA: CEA: 182 ng/mL — ABNORMAL HIGH (ref 0.0–4.7)

## 2019-02-06 LAB — CBC WITH DIFFERENTIAL/PLATELET
Abs Immature Granulocytes: 0.04 10*3/uL (ref 0.00–0.07)
Basophils Absolute: 0.1 10*3/uL (ref 0.0–0.1)
Basophils Relative: 1 %
Eosinophils Absolute: 0.2 10*3/uL (ref 0.0–0.5)
Eosinophils Relative: 3 %
HCT: 32.5 % — ABNORMAL LOW (ref 36.0–46.0)
Hemoglobin: 10.2 g/dL — ABNORMAL LOW (ref 12.0–15.0)
Immature Granulocytes: 1 %
Lymphocytes Relative: 20 %
Lymphs Abs: 1.6 10*3/uL (ref 0.7–4.0)
MCH: 30 pg (ref 26.0–34.0)
MCHC: 31.4 g/dL (ref 30.0–36.0)
MCV: 95.6 fL (ref 80.0–100.0)
Monocytes Absolute: 0.7 10*3/uL (ref 0.1–1.0)
Monocytes Relative: 8 %
Neutro Abs: 5.5 10*3/uL (ref 1.7–7.7)
Neutrophils Relative %: 67 %
Platelets: 268 10*3/uL (ref 150–400)
RBC: 3.4 MIL/uL — ABNORMAL LOW (ref 3.87–5.11)
RDW: 15 % (ref 11.5–15.5)
WBC: 8.1 10*3/uL (ref 4.0–10.5)
nRBC: 0 % (ref 0.0–0.2)

## 2019-02-06 LAB — GLUCOSE, CAPILLARY
Glucose-Capillary: 46 mg/dL — ABNORMAL LOW (ref 70–99)
Glucose-Capillary: 87 mg/dL (ref 70–99)
Glucose-Capillary: 41 mg/dL — CL (ref 70–99)
Glucose-Capillary: 87 mg/dL (ref 70–99)
Glucose-Capillary: 94 mg/dL (ref 70–99)
Glucose-Capillary: 149 mg/dL — ABNORMAL HIGH (ref 70–99)
Glucose-Capillary: 159 mg/dL — ABNORMAL HIGH (ref 70–99)

## 2019-02-06 LAB — BASIC METABOLIC PANEL
Anion gap: 6 (ref 5–15)
BUN: 51 mg/dL — ABNORMAL HIGH (ref 8–23)
CO2: 17 mmol/L — ABNORMAL LOW (ref 22–32)
Calcium: 7.7 mg/dL — ABNORMAL LOW (ref 8.9–10.3)
Chloride: 115 mmol/L — ABNORMAL HIGH (ref 98–111)
Creatinine, Ser: 1.31 mg/dL — ABNORMAL HIGH (ref 0.44–1.00)
GFR calc Af Amer: 47 mL/min — ABNORMAL LOW (ref >60)
GFR calc non Af Amer: 41 mL/min — ABNORMAL LOW (ref >60)
Glucose, Bld: 75 mg/dL (ref 70–99)
Potassium: 3.9 mmol/L (ref 3.5–5.1)
Sodium: 138 mmol/L (ref 135–145)

## 2019-02-06 LAB — AFP TUMOR MARKER: AFP, Serum, Tumor Marker: 2.5 ng/mL (ref 0.0–8.3)

## 2019-02-06 MED ORDER — ADULT MULTIVITAMIN W/MINERALS CH
1.0000 | ORAL_TABLET | Freq: Every day | ORAL | Status: DC
  Administered 2019-02-06 – 2019-02-08 (×2): 1 via ORAL
  Filled 2019-02-06 (×2): qty 1

## 2019-02-06 MED ORDER — ENSURE ENLIVE PO LIQD
237.0000 mL | Freq: Three times a day (TID) | ORAL | Status: DC
  Administered 2019-02-06 – 2019-02-07 (×4): 237 mL via ORAL

## 2019-02-06 NOTE — Progress Notes (Signed)
[facility]                                                                              Patient Demographics  Kristine Rodriguez, is a 73 y.o. female, DOB - 05-05-1946, SLH:[id number]  Admit date - 02/04/2019   Admitting Physician Reubin Milan, MD  Outpatient Primary MD for the patient is Sharilyn Sites, MD  Outpatient specialists:   LOS - 2  days   Medical records reviewed and are as summarized below:    Chief Complaint  Patient presents with   Back Pain       Brief summary   Patient is a 73 year old female with history of breast cancer, type 2 diabetes, hypertension, hyperlipidemia, tobacco use presented to ED with progressively worsening mid back pain since December last year.  Per patient, since then she has had decreased appetite, nausea, weight loss, dizziness, generalized weakness.  Denied any melena but occasional mild hematochezia from hemorrhoids.  She also reported complaints of decreased urination and volume.  No dysuria or flank pain or fevers or chills.  Patient had been waiting to get an MRI thoracic and lumbar spine outpatient, delayed due to COVID-19 pandemic. X-ray of the thoracic and lumbar spine in March 2020 had shown possible compression fracture of T9  In ED, CO2 17, glucose 245, BUN 93, creatinine 2.59, baseline 0.96 in March 2020, lactic acidosis with lactic acid 2.5, WBCs 13.2.  UA positive for UTI and ketones. CT renal stone study showed pancreatic mass with hepatic metastasis   Assessment & Plan    Principal Problem:   Pancreatic mass, new diagnosis with hepatic metastasis -CT of the renal stone study showed large mass involving the pancreatic body and tail with probable vascular encasement, adjacent adenopathy and hepatic metastasis consistent with metastatic pancreatic adenocarcinoma.  Cholelithiasis without evidence of cholecystitis or biliary dilatation. -Patient reports history of pancreatic ca in her mother and deceased sister,  will need genetic testing -Follow CEA, CA 19-9, AFP -IR consulted for liver biopsy in a.m., n.p.o. after mn -Discussed with Dr. Learta Codding today, possibly DC home after the liver biopsy tomorrow and will follow at [facility] for further work-up.  Active Problems: Sepsis secondary to UTI, gram-negative rod UTI -Patient met sepsis criteria at the time of admission with acute kidney injury, UTI, multiple metabolic abnormalities, lactic acidosis -Urine culture shows more than 100,000 colonies of gram-negative rods, follow sensitivities -Continue IV aztreonam for now until sensitivities available  Acute kidney injury with lactic acidosis -Creatinine 2.59 at the time of admission, 0.96 baseline in March 2020 -Continue IV fluid hydration, no obstructive uropathy. -Lactic acid resolved, creatinine improving - encouraged p.o. diet, increase Ensure to 3 times daily -Hold metformin  Hyponatremia -Likely due to  sepsis, HCTZ and dehydration -Continue IV fluid hydration -Sodium improving     Uncontrolled type 2 diabetes mellitus with hypoglycemia (HCC) -Hemoglobin A1c 8.5 on 12/22/2018, follows endocrinology outpatient -Patient having hypoglycemia, discontinued Lantus, for now continue sliding scale insulin as needed    Essential hypertension, benign -BP still soft, continue to hold valsartan HCTZ  Acute on chronic back pain -Likely due to #1, MRI  of the thoracic spine and lumbar spine showed no abnormality or degenerative changes, no compression fracture. -States back pain better with current pain medications regimen   Code Status: Full code DVT Prophylaxis:   Lovenox on hold for biopsy tomorrow Family Communication: Discussed in detail with the patient, all imaging results, lab results explained to the patient   Disposition Plan: Liver biopsy planned on Monday, possible DC home after biopsy  Time Spent in minutes 25 minutes  Procedures:  CT renal stone study MRI of the thoracic  and lumbar spine  Consultants:   Interventional radiology Oncology  Antimicrobials:   Anti-infectives (From admission, onward)   Start     Dose/Rate Route Frequency Ordered Stop   02/06/19 2200  levofloxacin (LEVAQUIN) IVPB 500 mg  Status:  Discontinued     500 mg 100 mL/hr over 60 Minutes Intravenous Every 48 hours 02/04/19 2128 02/05/19 0314   02/05/19 1200  aztreonam (AZACTAM) 0.5 g in dextrose 5 % 50 mL IVPB     0.5 g 100 mL/hr over 30 Minutes Intravenous Every 8 hours 02/05/19 0859     02/04/19 2330  aztreonam (AZACTAM) 1 g in sodium chloride 0.9 % 100 mL IVPB     1 g 200 mL/hr over 30 Minutes Intravenous  Once 02/04/19 2324 02/05/19 0311   02/04/19 2130  levofloxacin (LEVAQUIN) IVPB 500 mg     500 mg 100 mL/hr over 60 Minutes Intravenous  Once 02/04/19 2119 02/04/19 2220   02/04/19 2045  sulfamethoxazole-trimethoprim (BACTRIM DS) 800-160 MG per tablet 1 tablet  Status:  Discontinued     1 tablet Oral  Once 02/04/19 2041 02/04/19 2103         Medications  Scheduled Meds:  feeding supplement (ENSURE ENLIVE)  237 mL Oral TID BM   insulin aspart  0-9 Units Subcutaneous TID WC   lidocaine  1 patch Transdermal Q24H   Continuous Infusions:  sodium chloride 125 mL/hr at 02/06/19 0444   aztreonam 0.5 g (02/06/19 0444)   PRN Meds:.acetaminophen **OR** acetaminophen, albuterol, HYDROmorphone (DILAUDID) injection, ondansetron **OR** ondansetron (ZOFRAN) IV, oxyCODONE-acetaminophen      Subjective:   Kristine Rodriguez was seen and examined today.  Back pain feels better, in better spirits today.  Hypoglycemia with CBGs low, 41 this morning.  No nausea or vomiting.  Patient denies dizziness, chest pain, shortness of breath,  new weakness, numbess, tingling. No fevers.  Objective:   Vitals:   02/05/19 0615 02/05/19 1327 02/05/19 2039 02/06/19 0454  BP: 137/72 105/63 100/68 110/68  Pulse: 78 77 86 80  Resp: 19 16 14 16   Temp: 98.1 F (36.7 C) 98.4 F (36.9 C) (!) 97.5  F (36.4 C) 98.6 F (37 C)  TempSrc: Oral Oral Oral Oral  SpO2: 100% 100% 100% 100%  Weight:      Height:        Intake/Output Summary (Last 24 hours) at 02/06/2019 1047 Last data filed at 02/06/2019 0900 Gross per 24 hour  Intake 3094.4 ml  Output 575 ml  Net 2519.4 ml     Wt Readings from Last 3 Encounters:  02/04/19 56.7 kg  12/28/18 56.7 kg  12/28/18 57.6 kg    Physical Exam  General: Alert and oriented x 3, NAD  Eyes:   HEENT:    Cardiovascular: S1 S2 clear,  RRR. No pedal edema b/l  Respiratory: CTAB, no wheezing, rales or rhonchi  Gastrointestinal: Soft, nontender, nondistended, NBS  Ext: no pedal edema bilaterally  Neuro: no new deficits  Musculoskeletal: No cyanosis, clubbing  Skin: No rashes  Psych: Normal affect and demeanor, alert and oriented x3    Data Reviewed:  I have personally reviewed following labs and imaging studies  Micro Results Recent Results (from the past 240 hour(s))  Urine Culture     Status: Abnormal (Preliminary result)   Collection Time: 02/04/19  7:39 PM  Result Value Ref Range Status   Specimen Description   Final    URINE, CLEAN CATCH Performed at [facility], [address], [place], [place] [address]    Special Requests   Final    NONE Performed at [facility], [address], [place],  [address]    Culture >=100,000 COLONIES/mL GRAM NEGATIVE RODS (A)  Final   Report Status PENDING  Incomplete    Radiology Reports Dg Chest 2 View  Result Date: 02/05/2019 CLINICAL DATA:  Pancreatic mass EXAM: CHEST - 2 VIEW COMPARISON:  10/24/2008 FINDINGS: There are ill-defined focal opacities at the right lung base which may be related to the ribs. Underlying lung nodules are not excluded. Lungs are hyperaerated with interstitial prominence. Normal heart size. IMPRESSION: Possible right basilar lung nodules.  CT is warranted. Electronically Signed   By: Marybelle Killings M.D.   On: 02/05/2019 14:50   Mr Thoracic Spine Wo  Contrast  Result Date: 02/04/2019 CLINICAL DATA:  Acute midline thoracic back pain. Chronic bilateral lower back pain without radiculopathy. EXAM: MRI THORACIC AND LUMBAR SPINE WITHOUT CONTRAST TECHNIQUE: Multiplanar and multiecho pulse sequences of the thoracic and lumbar spine were obtained without intravenous contrast. COMPARISON:  Thoracic and lumbar spine x-rays dated December 28, 2018. FINDINGS: MRI THORACIC SPINE FINDINGS Alignment:  Physiologic. Vertebrae: No fracture, evidence of discitis, or bone lesion. Cord:  Normal signal and morphology. Paraspinal and other soft tissues: There are a few small pulmonary nodules in the right upper lobe measuring up to 5 mm. Otherwise negative. Disc levels: No significant disc bulge or herniation. No spinal canal or neuroforaminal stenosis. MRI LUMBAR SPINE FINDINGS Segmentation:  Standard. Alignment:  Physiologic. Vertebrae:  No fracture, evidence of discitis, or bone lesion. Conus medullaris and cauda equina: Conus extends to the L1-L2 level. Conus and cauda equina appear normal. Paraspinal and other soft tissues: Negative. Disc levels: T12-L1 to L3-L4: No significant disc bulge or herniation. No spinal canal or neuroforaminal stenosis. L4-L5: Minimal disc bulging. Mild right and minimal left facet arthropathy. No stenosis. L5-S1: Negative disc. Mild bilateral facet arthropathy. No stenosis. IMPRESSION: Thoracic spine: 1. No acute osseous abnormality or significant degenerative changes. 2. There are few small pulmonary nodules in the right upper lobe measuring up to 5 mm. Recommend non-emergent CT chest without contrast for further evaluation. Lumbar spine: 1.  No acute osseous abnormality. 2. Mild lower lumbar facet arthropathy.  No stenosis or impingement. Electronically Signed   By: Titus Dubin M.D.   On: 02/04/2019 17:22   Mr Lumbar Spine Wo Contrast  Result Date: 02/04/2019 CLINICAL DATA:  Acute midline thoracic back pain. Chronic bilateral lower back pain  without radiculopathy. EXAM: MRI THORACIC AND LUMBAR SPINE WITHOUT CONTRAST TECHNIQUE: Multiplanar and multiecho pulse sequences of the thoracic and lumbar spine were obtained without intravenous contrast. COMPARISON:  Thoracic and lumbar spine x-rays dated December 28, 2018. FINDINGS: MRI THORACIC SPINE FINDINGS Alignment:  Physiologic. Vertebrae: No fracture, evidence of discitis, or bone lesion. Cord:  Normal signal and morphology. Paraspinal and other soft tissues: There are a few small pulmonary nodules in the right upper lobe measuring up to  5 mm. Otherwise negative. Disc levels: No significant disc bulge or herniation. No spinal canal or neuroforaminal stenosis. MRI LUMBAR SPINE FINDINGS Segmentation:  Standard. Alignment:  Physiologic. Vertebrae:  No fracture, evidence of discitis, or bone lesion. Conus medullaris and cauda equina: Conus extends to the L1-L2 level. Conus and cauda equina appear normal. Paraspinal and other soft tissues: Negative. Disc levels: T12-L1 to L3-L4: No significant disc bulge or herniation. No spinal canal or neuroforaminal stenosis. L4-L5: Minimal disc bulging. Mild right and minimal left facet arthropathy. No stenosis. L5-S1: Negative disc. Mild bilateral facet arthropathy. No stenosis. IMPRESSION: Thoracic spine: 1. No acute osseous abnormality or significant degenerative changes. 2. There are few small pulmonary nodules in the right upper lobe measuring up to 5 mm. Recommend non-emergent CT chest without contrast for further evaluation. Lumbar spine: 1.  No acute osseous abnormality. 2. Mild lower lumbar facet arthropathy.  No stenosis or impingement. Electronically Signed   By: Titus Dubin M.D.   On: 02/04/2019 17:22   Ct Renal Stone Study  Result Date: 02/04/2019 CLINICAL DATA:  Low back pain with nausea for 4 months. History of left breast cancer and diabetes EXAM: CT ABDOMEN AND PELVIS WITHOUT CONTRAST TECHNIQUE: Multidetector CT imaging of the abdomen and pelvis was  performed following the standard protocol without IV contrast. COMPARISON:  None. FINDINGS: Lower chest: Mild emphysema and scattered scarring in both lung bases. No significant pleural or pericardial effusion. There is a small hiatal hernia. Hepatobiliary: There are several ill-defined attic lesions which are highly suspicious for metastatic disease. Largest lesion is located inferiorly in the right hepatic lobe (segment 6), measuring 2.4 x 2.1 cm on image 20/2. There is a small gallstone. No evidence of gallbladder wall thickening, surrounding inflammation or biliary dilatation. Pancreas: There is a large mass involving the pancreatic body and tail, measuring up to 6.3 x 5.2 cm on image 16/2. This partially encases the celiac trunk and is highly suspicious for pancreatic adenocarcinoma. Spleen: Normal in size without focal abnormality. Adrenals/Urinary Tract: Minimal nodularity of both adrenal glands, nonspecific. There are several nonobstructing small right renal calculi. No evidence of ureteral calculus or hydronephrosis. Probable pelvic floor laxity. The bladder otherwise appears normal. Stomach/Bowel: No evidence of bowel wall thickening, distention or surrounding inflammatory change. Moderate diverticular changes in the distal colon. The appendix appears normal. Vascular/Lymphatic: There are several mildly enlarged lymph nodes in the upper retroperitoneum adjacent to the pancreatic mass, suspicious for metastatic disease. Largest is a left periaortic node measuring 15 mm on image 21/2. There is mild aortic and branch vessel atherosclerosis. As above, the pancreatic mass partially encases the celiac trunk as well as the splenic vessels. Reproductive: The uterus and ovaries appear normal. No adnexal mass. Probable pelvic floor laxity. Other: No ascites or peritoneal nodular postsurgical changes in the low anterior abdominal wall. Musculoskeletal: There is deformity of the left pelvic bones which may be  developmental or posttraumatic. No acute osseous findings or signs of osseous metastatic disease. IMPRESSION: 1. Large mass involving the pancreatic body and tail with probable vascular encasement, adjacent adenopathy and hepatic metastases consistent with metastatic pancreatic adenocarcinoma. Tissue sampling recommended. 2. Cholelithiasis without evidence of cholecystitis or biliary dilatation. 3. Nonobstructing right renal calculi. 4. Prominent distal colonic diverticulosis without acute inflammation. 5. No clear explanation for back pain. 6.  Aortic Atherosclerosis (ICD10-I70.0). Electronically Signed   By: Richardean Sale M.D.   On: 02/04/2019 20:18   [place] Ekg Site Rite  Result Date: 02/05/2019 If [employer]  not attached, placement could not be confirmed due to current cardiac rhythm.   Lab Data:  CBC: Recent Labs  Lab 02/04/19 1721 02/06/19 0558  WBC 13.2* 8.1  NEUTROABS  --  5.5  HGB 14.0 10.2*  HCT 43.2 32.5*  MCV 90.0 95.6  PLT 407* 867   Basic Metabolic Panel: Recent Labs  Lab 02/04/19 1721 02/06/19 0558  NA 131* 138  K 4.1 3.9  CL 95* 115*  CO2 17* 17*  GLUCOSE 245* 75  BUN 93* 51*  CREATININE 2.59* 1.31*  CALCIUM 9.6 7.7*   GFR: Estimated Creatinine Clearance: 29.7 mL/min (A) (by C-G formula based on SCr of 1.31 mg/dL (H)). Liver Function Tests: Recent Labs  Lab 02/04/19 1721  AST 15  ALT 18  ALKPHOS 84  BILITOT 0.5  PROT 7.8  ALBUMIN 4.1   Recent Labs  Lab 02/04/19 1721  LIPASE 33   No results for input(s): AMMONIA in the last 168 hours. Coagulation Profile: Recent Labs  Lab 02/04/19 2213  INR 1.1   Cardiac Enzymes: No results for input(s): CKTOTAL, CKMB, CKMBINDEX, TROPONINI in the last 168 hours. BNP (last 3 results) No results for input(s): PROBNP in the last 8760 hours. HbA1C: No results for input(s): HGBA1C in the last 72 hours. CBG: Recent Labs  Lab 02/05/19 2213 02/06/19 0209 02/06/19 0243 02/06/19 0751 02/06/19 0817    GLUCAP 66* 46* 87 41* 87   Lipid Profile: No results for input(s): CHOL, HDL, LDLCALC, TRIG, CHOLHDL, LDLDIRECT in the last 72 hours. Thyroid Function Tests: No results for input(s): TSH, T4TOTAL, FREET4, T3FREE, THYROIDAB in the last 72 hours. Anemia Panel: No results for input(s): VITAMINB12, FOLATE, FERRITIN, TIBC, IRON, RETICCTPCT in the last 72 hours. Urine analysis:    Component Value Date/Time   COLORURINE YELLOW 02/04/2019 1939   APPEARANCEUR CLOUDY (A) 02/04/2019 1939   LABSPEC 1.015 02/04/2019 1939   PHURINE 5.0 02/04/2019 1939   GLUCOSEU NEGATIVE 02/04/2019 1939   HGBUR SMALL (A) 02/04/2019 1939   BILIRUBINUR NEGATIVE 02/04/2019 1939   KETONESUR 5 (A) 02/04/2019 1939   PROTEINUR 100 (A) 02/04/2019 1939   UROBILINOGEN 1.0 09/16/2007 1009   NITRITE NEGATIVE 02/04/2019 1939   LEUKOCYTESUR LARGE (A) 02/04/2019 1939     Adael Culbreath M.D. [facility] 02/06/2019, 10:47 AM  Pager: [phone] Between 7am to 7pm - call Pager - [phone]  After 7pm go to [email or web address] - password [email or web address]  Call night coverage person covering after 7pm

## 2019-02-06 NOTE — Progress Notes (Addendum)
Initial Nutrition Assessment  RD working remotely.   DOCUMENTATION CODES:   (unable to assess for malnutrition at this time. )  INTERVENTION:  - continue ensure enlive TID, each supplement provides 350 kcal and 20 grams protein. - will order daily multivitamin with minerals. - continue to encourage PO intakes. - weigh patient today.  - if not contraindicated, recommend trial of appetite stimulant given ongoing decreased appetite for several months.    NUTRITION DIAGNOSIS:   Increased nutrient needs related to acute illness, catabolic illness as evidenced by estimated needs.  GOAL:   Patient will meet greater than or equal to 90% of their needs  MONITOR:   PO intake, Supplement acceptance, Labs, Weight trends  REASON FOR ASSESSMENT:   Malnutrition Screening Tool  ASSESSMENT:   73 year old female with history of breast cancer, type 2 DM, HTN, hyperlipidemia, and tobacco use. She presented to the ED with progressively worsening mid-back pain since December 2019. She reported that since that time, she has been experiencing decreased appetite, nausea, weight loss, dizziness, and generalized weakness. Patient had been waiting to get an MRI thoracic and lumbar spine outpatient, delayed due to COVID-19 pandemic. X-ray of the thoracic and lumbar spine in March 2020 had shown possible compression fracture of T9. In ED, UA positive for UTI and ketones. CT renal stone study showed pancreatic mass with hepatic metastasis.  BMI indicates normal weight. Per review of RN flow sheet, patient consumed 40% of breakfast yesterday and 30% of breakfast today. Patient reports that in December she began having intermittent nausea and a decreased appetite/decreased desire to eat. She does try to eat several times each day, but portions are often smaller than usual. Ensure Enlive was ordered TID at time of admission and patient has accepted 2 of 3 bottles offered.   Per chart review, current weight is  125 lb and weight on 12/28/18 is recorded as exactly the same. Questions accuracy of this. Weight on 09/27/18 (around the time that symptoms started) was 138 lb. This indicates 13 lb weight loss (9% body weight) in the past 4 months. Highly suspect some degree of malnutrition but unable to confirm prior to performing NFPE.   Per Dr. Josem Kaufmann note today: pancreatic mass with newly dx hepatic mets, plan for liver biopsy 4/27, sepsis 2/2 UTI, AKI, lactic acidosis, uncontrolled type 2 DM with last HgbA1c on 12/22/18 being 8.5%, acute on chronic back pain.     Medications reviewed; sliding scale novolog. Labs reviewed; CBGs: 41-94 mg/dl since 2 AM, Cl: 115 mmol/l, BUN: 51 mg/dl, creatinine: 1.31 mg/dl, Ca: 7.7 mg/dl, GFR: 41 ml/min.  IVF; NS @ 125 ml/hr.     NUTRITION - FOCUSED PHYSICAL EXAM:  unable to assess at this time.   Diet Order:   Diet Order            Diet NPO time specified Except for: Sips with Meds  Diet effective midnight        Diet Heart Room service appropriate? Yes; Fluid consistency: Thin  Diet effective now              EDUCATION NEEDS:   Not appropriate for education at this time  Skin:  Skin Assessment: Reviewed RN Assessment  Last BM:  4/26  Height:   Ht Readings from Last 1 Encounters:  02/04/19 5\' 1"  (1.549 m)    Weight:   Wt Readings from Last 1 Encounters:  02/04/19 56.7 kg    Ideal Body Weight:  47.73 kg  BMI:  Body mass index is 23.62 kg/m.  Estimated Nutritional Needs:   Kcal:  1700-1985 kcal  Protein:  70-90 grams  Fluid:  >/= 1.8 L/day     Jarome Matin, MS, RD, LDN, [facility] Inpatient Clinical Dietitian Pager # [phone] After hours/weekend pager # [phone]

## 2019-02-06 NOTE — Progress Notes (Signed)
Spoke with Dr Tana Coast re PICC order.  Originally for lab draws, labs obtained this am and resulted.   Has PIV, leaking.  Will make certain pt has adequate access and will either place PICC or cancel PICC order per Dr Tana Coast.  Pt currently eating breakfast.

## 2019-02-06 NOTE — Progress Notes (Signed)
New Hematology/Oncology Consult   Requesting VF:IEPPIRJJ Kristine     Reason for Consult: Pancreas cancer  HPI: [name] was admitted 02/04/2019 with back pain.  She reports a history of progressive back pain since December 2019.  MRIs of the thoracic and lumbar spine on 02/04/2019 revealed no acute osseous abnormality.  A few small pulmonary nodules were noted in the right upper lobe. She underwent a CT renal stone protocol this revealed scarring and changes of emphysema at the lung bases.  Ill-defined liver lesions were noted suspicious for metastases.  A large mass involve the body and tail the pancreas.  No evidence of ureteral calculus.  There are mildly enlarged lymph nodes in the upper retroperitoneum suspicious for metastatic disease including a 1.5 cm left periaortic node.  The pancreas mass partially encases the celiac trunk and splenic vessels.  Deformity of the left pelvic bones.  She reports adequate pain control with current narcotic regimen.   Past Medical History:  Diagnosis Date  . Cancer [facility]) 1993 and 2008   hx of breast cancer left  . Diabetes mellitus   . Hypertension   . Tobacco use 02/04/2019  : G2,P3- twinns  Past Surgical History:  Procedure Laterality Date  . bladder tack    . COLONOSCOPY  06/04/2012   Procedure: COLONOSCOPY;  Surgeon: Jamesetta So, MD;  Location: [facility];  Service: Gastroenterology;  Laterality: N/A;  . ESOPHAGOGASTRODUODENOSCOPY  06/04/2012   Procedure: ESOPHAGOGASTRODUODENOSCOPY (EGD);  Surgeon: Jamesetta So, MD;  Location: [facility];  Service: Gastroenterology;  Laterality: N/A;  . HERNIA REPAIR    . left mastectomy    . TONSILLECTOMY    . VESICO-VAGINAL FISTULA REPAIR    :   Current Facility-Administered Medications:  .  0.9 %  sodium chloride infusion, , Intravenous, Continuous, Kristine Milan, MD, Last Rate: 125 mL/hr at 02/06/19 0444 .  acetaminophen (TYLENOL) tablet 650 mg, 650 mg, Oral, Q6H PRN **OR**  acetaminophen (TYLENOL) suppository 650 mg, 650 mg, Rectal, Q6H PRN, Kristine Milan, MD .  albuterol (PROVENTIL) (2.5 MG/3ML) 0.083% nebulizer solution 2.5 mg, 2.5 mg, Nebulization, Q4H PRN, Kristine Milan, MD .  aztreonam (AZACTAM) 0.5 g in dextrose 5 % 50 mL IVPB, 0.5 g, Intravenous, Q8H, Kristine Rodriguez, RPH, Last Rate: 100 mL/hr at 02/06/19 0444, 0.5 g at 02/06/19 0444 .  feeding supplement (ENSURE ENLIVE) (ENSURE ENLIVE) liquid 237 mL, 237 mL, Oral, TID BM, Kristine, Ripudeep K, MD, 237 mL at 02/06/19 0759 .  HYDROmorphone (DILAUDID) injection 1 mg, 1 mg, Intravenous, Q4H PRN, Kristine, Ripudeep K, MD .  insulin aspart (novoLOG) injection 0-9 Units, 0-9 Units, Subcutaneous, TID WC, Kristine Milan, MD, 2 Units at 02/05/19 1145 .  lidocaine (LIDODERM) 5 % 1 patch, 1 patch, Transdermal, Q24H, Kristine, Ripudeep K, MD, 1 patch at 02/05/19 1145 .  ondansetron (ZOFRAN) tablet 4 mg, 4 mg, Oral, Q6H PRN **OR** ondansetron (ZOFRAN) injection 4 mg, 4 mg, Intravenous, Q6H PRN, Kristine Milan, MD, 4 mg at 02/06/19 8841 .  oxyCODONE-acetaminophen (PERCOCET/ROXICET) 5-325 MG per tablet 1-2 tablet, 1-2 tablet, Oral, Q4H PRN, Kristine, Ripudeep K, MD, 2 tablet at 02/06/19 0759:  . feeding supplement (ENSURE ENLIVE)  237 mL Oral TID BM  . insulin aspart  0-9 Units Subcutaneous TID WC  . lidocaine  1 patch Transdermal Q24H  :  Allergies  Allergen Reactions  . Duricef [Cefadroxil] Shortness Of Breath and Swelling  . Tape Rash  :  FH: Mother and  Sister with pancreas cancer  SOCIAL HISTORY: She lives in [place].  She smokes cigarettes.  Rare alcohol use.  No risk factor for HIV or hepatitis.  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  Review of Systems:  Positives include: Anorexia, 50 pound weight loss over the past year, intermittent nausea, abdomen/back pain  A complete ROS was otherwise negative.   Physical Exam:  Blood pressure 110/68, pulse 80, temperature 98.6 F (37 C),  temperature source Oral, resp. rate 16, height 5\' 1"  (1.549 m), weight 125 lb (56.7 kg), SpO2 100 %.  HEENT: Neck without mass Abdomen: Mild tenderness in the right upper abdomen, no hepatosplenomegaly, no apparent ascites, no mass  Vascular: No leg edema Lymph nodes: No cervical, supraclavicular, or axillary nodes Neurologic: Alert and oriented Skin: Status post left mastectomy, no evidence for chest wall tumor recurrence.  Few mobile 1-2 cm nodular cutaneous lesions at the upper abdomen Musculoskeletal: No spine tenderness  LABS:  Recent Labs    02/04/19 1721 02/06/19 0558  WBC 13.2* 8.1  HGB 14.0 10.2*  HCT 43.2 32.5*  PLT 407* 268    Recent Labs    02/04/19 1721 02/06/19 0558  NA 131* 138  Rodriguez 4.1 3.9  CL 95* 115*  CO2 17* 17*  GLUCOSE 245* 75  BUN 93* 51*  CREATININE 2.59* 1.31*  CALCIUM 9.6 7.7*      RADIOLOGY:  Dg Chest 2 View  Result Date: 02/05/2019 CLINICAL DATA:  Pancreatic mass EXAM: CHEST - 2 VIEW COMPARISON:  10/24/2008 FINDINGS: There are ill-defined focal opacities at the right lung base which may be related to the ribs. Underlying lung nodules are not excluded. Lungs are hyperaerated with interstitial prominence. Normal heart size. IMPRESSION: Possible right basilar lung nodules.  CT is warranted. Electronically Signed   By: Kristine Rodriguez M.D.   On: 02/05/2019 14:50   Mr Thoracic Spine Wo Contrast  Result Date: 02/04/2019 CLINICAL DATA:  Acute midline thoracic back pain. Chronic bilateral lower back pain without radiculopathy. EXAM: MRI THORACIC AND LUMBAR SPINE WITHOUT CONTRAST TECHNIQUE: Multiplanar and multiecho pulse sequences of the thoracic and lumbar spine were obtained without intravenous contrast. COMPARISON:  Thoracic and lumbar spine x-rays dated December 28, 2018. FINDINGS: MRI THORACIC SPINE FINDINGS Alignment:  Physiologic. Vertebrae: No fracture, evidence of discitis, or bone lesion. Cord:  Normal signal and morphology. Paraspinal and other soft  tissues: There are a few small pulmonary nodules in the right upper lobe measuring up to 5 mm. Otherwise negative. Disc levels: No significant disc bulge or herniation. No spinal canal or neuroforaminal stenosis. MRI LUMBAR SPINE FINDINGS Segmentation:  Standard. Alignment:  Physiologic. Vertebrae:  No fracture, evidence of discitis, or bone lesion. Conus medullaris and cauda equina: Conus extends to the L1-L2 level. Conus and cauda equina appear normal. Paraspinal and other soft tissues: Negative. Disc levels: T12-L1 to L3-L4: No significant disc bulge or herniation. No spinal canal or neuroforaminal stenosis. L4-L5: Minimal disc bulging. Mild right and minimal left facet arthropathy. No stenosis. L5-S1: Negative disc. Mild bilateral facet arthropathy. No stenosis. IMPRESSION: Thoracic spine: 1. No acute osseous abnormality or significant degenerative changes. 2. There are few small pulmonary nodules in the right upper lobe measuring up to 5 mm. Recommend non-emergent CT chest without contrast for further evaluation. Lumbar spine: 1.  No acute osseous abnormality. 2. Mild lower lumbar facet arthropathy.  No stenosis or impingement. Electronically Signed   By: Titus Dubin M.D.   On: 02/04/2019 17:22  Mr Lumbar Spine Wo Contrast  Result Date: 02/04/2019 CLINICAL DATA:  Acute midline thoracic back pain. Chronic bilateral lower back pain without radiculopathy. EXAM: MRI THORACIC AND LUMBAR SPINE WITHOUT CONTRAST TECHNIQUE: Multiplanar and multiecho pulse sequences of the thoracic and lumbar spine were obtained without intravenous contrast. COMPARISON:  Thoracic and lumbar spine x-rays dated December 28, 2018. FINDINGS: MRI THORACIC SPINE FINDINGS Alignment:  Physiologic. Vertebrae: No fracture, evidence of discitis, or bone lesion. Cord:  Normal signal and morphology. Paraspinal and other soft tissues: There are a few small pulmonary nodules in the right upper lobe measuring up to 5 mm. Otherwise negative. Disc  levels: No significant disc bulge or herniation. No spinal canal or neuroforaminal stenosis. MRI LUMBAR SPINE FINDINGS Segmentation:  Standard. Alignment:  Physiologic. Vertebrae:  No fracture, evidence of discitis, or bone lesion. Conus medullaris and cauda equina: Conus extends to the L1-L2 level. Conus and cauda equina appear normal. Paraspinal and other soft tissues: Negative. Disc levels: T12-L1 to L3-L4: No significant disc bulge or herniation. No spinal canal or neuroforaminal stenosis. L4-L5: Minimal disc bulging. Mild right and minimal left facet arthropathy. No stenosis. L5-S1: Negative disc. Mild bilateral facet arthropathy. No stenosis. IMPRESSION: Thoracic spine: 1. No acute osseous abnormality or significant degenerative changes. 2. There are few small pulmonary nodules in the right upper lobe measuring up to 5 mm. Recommend non-emergent CT chest without contrast for further evaluation. Lumbar spine: 1.  No acute osseous abnormality. 2. Mild lower lumbar facet arthropathy.  No stenosis or impingement. Electronically Signed   By: Titus Dubin M.D.   On: 02/04/2019 17:22   Ct Renal Stone Study  Result Date: 02/04/2019 CLINICAL DATA:  Low back pain with nausea for 4 months. History of left breast cancer and diabetes EXAM: CT ABDOMEN AND PELVIS WITHOUT CONTRAST TECHNIQUE: Multidetector CT imaging of the abdomen and pelvis was performed following the standard protocol without IV contrast. COMPARISON:  None. FINDINGS: Lower chest: Mild emphysema and scattered scarring in both lung bases. No significant pleural or pericardial effusion. There is a small hiatal hernia. Hepatobiliary: There are several ill-defined attic lesions which are highly suspicious for metastatic disease. Largest lesion is located inferiorly in the right hepatic lobe (segment 6), measuring 2.4 x 2.1 cm on image 20/2. There is a small gallstone. No evidence of gallbladder wall thickening, surrounding inflammation or biliary  dilatation. Pancreas: There is a large mass involving the pancreatic body and tail, measuring up to 6.3 x 5.2 cm on image 16/2. This partially encases the celiac trunk and is highly suspicious for pancreatic adenocarcinoma. Spleen: Normal in size without focal abnormality. Adrenals/Urinary Tract: Minimal nodularity of both adrenal glands, nonspecific. There are several nonobstructing small right renal calculi. No evidence of ureteral calculus or hydronephrosis. Probable pelvic floor laxity. The bladder otherwise appears normal. Stomach/Bowel: No evidence of bowel wall thickening, distention or surrounding inflammatory change. Moderate diverticular changes in the distal colon. The appendix appears normal. Vascular/Lymphatic: There are several mildly enlarged lymph nodes in the upper retroperitoneum adjacent to the pancreatic mass, suspicious for metastatic disease. Largest is a left periaortic node measuring 15 mm on image 21/2. There is mild aortic and branch vessel atherosclerosis. As above, the pancreatic mass partially encases the celiac trunk as well as the splenic vessels. Reproductive: The uterus and ovaries appear normal. No adnexal mass. Probable pelvic floor laxity. Other: No ascites or peritoneal nodular postsurgical changes in the low anterior abdominal wall. Musculoskeletal: There is deformity of the left pelvic bones which may  be developmental or posttraumatic. No acute osseous findings or signs of osseous metastatic disease. IMPRESSION: 1. Large mass involving the pancreatic body and tail with probable vascular encasement, adjacent adenopathy and hepatic metastases consistent with metastatic pancreatic adenocarcinoma. Tissue sampling recommended. 2. Cholelithiasis without evidence of cholecystitis or biliary dilatation. 3. Nonobstructing right renal calculi. 4. Prominent distal colonic diverticulosis without acute inflammation. 5. No clear explanation for back pain. 6.  Aortic Atherosclerosis  (ICD10-I70.0). Electronically Signed   By: Richardean Sale M.D.   On: 02/04/2019 20:18   [place] Ekg Site Rite  Result Date: 02/05/2019 If Site Rite image not attached, placement could not be confirmed due to current cardiac rhythm.   Assessment and Plan:   1.  Pancreas cancer  CT renal study 02/04/2019- pancreas body/tail mass with partial celiac encasement, adjacent adenopathy, and liver metastases 2.  Anorexia/weight loss secondary to #1 3.  Abdomen/back pain secondary to #1 4.  COPD 5.  Family history of pancreas cancer (mother and sister) 1.  Remote history of left-sided breast cancer 7.  Diabetes   Kristine Rodriguez was admitted with abdomen/back pain.  The pain appears to be related to a pancreas mass.  The clinical presentation is consistent with metastatic pancreas cancer.  I discussed the diagnosis of pancreas cancer and treatment options with Kristine Rodriguez.  She understands no therapy will be curative for of a diagnosis of pancreas cancer is confirmed. We discussed systemic treatment options including chemotherapy, biologic therapy, and immunotherapy.  I reviewed the CT images with her.  I agree with the plan for a liver biopsy to confirm a diagnosis of metastatic pancreas cancer.  The CA 19-9 is pending.  Recommendations: 1.  Proceed with biopsy of a liver lesion to obtain tissue for surgical pathology and molecular studies 2.  Genetics counselor referral with germline DNA testing to rule out a hereditary cancer syndrome 3.  Outpatient follow-up will be scheduled at the Cancer center for later this week 4.  Continue oxycodone for pain       Betsy Coder, MD 02/06/2019, 10:56 AM

## 2019-02-06 NOTE — Progress Notes (Signed)
Hypoglycemic Event  CBG: 46  Treatment: 8 oz juice/soda  Symptoms: None  Follow-up CBG: Time: CBG Result:  Possible Reasons for Event: inadequate PO intake   Comments/MD notified:    Kizzie Ide

## 2019-02-07 ENCOUNTER — Encounter (HOSPITAL_COMMUNITY): Payer: Self-pay | Admitting: Interventional Radiology

## 2019-02-07 ENCOUNTER — Telehealth: Payer: Self-pay | Admitting: Nurse Practitioner

## 2019-02-07 ENCOUNTER — Inpatient Hospital Stay (HOSPITAL_COMMUNITY): Payer: PPO

## 2019-02-07 HISTORY — PX: IR US GUIDE BX ASP/DRAIN: IMG2392

## 2019-02-07 LAB — CBC WITH DIFFERENTIAL/PLATELET
Abs Immature Granulocytes: 0.05 10*3/uL (ref 0.00–0.07)
Basophils Absolute: 0.1 10*3/uL (ref 0.0–0.1)
Basophils Relative: 1 %
Eosinophils Absolute: 0.5 10*3/uL (ref 0.0–0.5)
Eosinophils Relative: 5 %
HCT: 36.5 % (ref 36.0–46.0)
Hemoglobin: 11.4 g/dL — ABNORMAL LOW (ref 12.0–15.0)
Immature Granulocytes: 1 %
Lymphocytes Relative: 24 %
Lymphs Abs: 2.1 10*3/uL (ref 0.7–4.0)
MCH: 29.8 pg (ref 26.0–34.0)
MCHC: 31.2 g/dL (ref 30.0–36.0)
MCV: 95.3 fL (ref 80.0–100.0)
Monocytes Absolute: 0.8 10*3/uL (ref 0.1–1.0)
Monocytes Relative: 9 %
Neutro Abs: 5.4 10*3/uL (ref 1.7–7.7)
Neutrophils Relative %: 60 %
Platelets: 284 10*3/uL (ref 150–400)
RBC: 3.83 MIL/uL — ABNORMAL LOW (ref 3.87–5.11)
RDW: 15.1 % (ref 11.5–15.5)
WBC: 8.9 10*3/uL (ref 4.0–10.5)
nRBC: 0 % (ref 0.0–0.2)

## 2019-02-07 LAB — BASIC METABOLIC PANEL
Anion gap: 7 (ref 5–15)
BUN: 39 mg/dL — ABNORMAL HIGH (ref 8–23)
CO2: 19 mmol/L — ABNORMAL LOW (ref 22–32)
Calcium: 8.2 mg/dL — ABNORMAL LOW (ref 8.9–10.3)
Chloride: 115 mmol/L — ABNORMAL HIGH (ref 98–111)
Creatinine, Ser: 1.1 mg/dL — ABNORMAL HIGH (ref 0.44–1.00)
GFR calc Af Amer: 58 mL/min — ABNORMAL LOW (ref >60)
GFR calc non Af Amer: 50 mL/min — ABNORMAL LOW (ref >60)
Glucose, Bld: 74 mg/dL (ref 70–99)
Potassium: 4 mmol/L (ref 3.5–5.1)
Sodium: 141 mmol/L (ref 135–145)

## 2019-02-07 LAB — URINE CULTURE: Culture: >=100000 — AB

## 2019-02-07 LAB — PROTIME-INR
INR: 0.9 (ref 0.8–1.2)
Prothrombin Time: 12.4 seconds (ref 11.4–15.2)

## 2019-02-07 LAB — GLUCOSE, CAPILLARY
Glucose-Capillary: 144 mg/dL — ABNORMAL HIGH (ref 70–99)
Glucose-Capillary: 162 mg/dL — ABNORMAL HIGH (ref 70–99)
Glucose-Capillary: 50 mg/dL — ABNORMAL LOW (ref 70–99)
Glucose-Capillary: 80 mg/dL (ref 70–99)
Glucose-Capillary: 87 mg/dL (ref 70–99)

## 2019-02-07 LAB — CANCER ANTIGEN 19-9: CA 19-9: 28204 U/mL — ABNORMAL HIGH (ref 0–35)

## 2019-02-07 MED ORDER — FENTANYL CITRATE (PF) 100 MCG/2ML IJ SOLN
INTRAMUSCULAR | Status: AC
  Filled 2019-02-07: qty 2

## 2019-02-07 MED ORDER — ENOXAPARIN SODIUM 40 MG/0.4ML ~~LOC~~ SOLN
40.0000 mg | SUBCUTANEOUS | Status: DC
  Administered 2019-02-08: 40 mg via SUBCUTANEOUS
  Filled 2019-02-07: qty 0.4

## 2019-02-07 MED ORDER — DEXTROSE-NACL 5-0.45 % IV SOLN
INTRAVENOUS | Status: DC
  Administered 2019-02-07: 11:00:00 via INTRAVENOUS

## 2019-02-07 MED ORDER — FENTANYL CITRATE (PF) 100 MCG/2ML IJ SOLN
INTRAMUSCULAR | Status: AC | PRN
  Administered 2019-02-07 (×2): 50 ug via INTRAVENOUS

## 2019-02-07 MED ORDER — MIDAZOLAM HCL 2 MG/2ML IJ SOLN
INTRAMUSCULAR | Status: AC
  Filled 2019-02-07: qty 4

## 2019-02-07 MED ORDER — DEXTROSE 250 MG/ML IV SOLN
25.0000 g | Freq: Once | INTRAVENOUS | Status: DC
  Filled 2019-02-07: qty 100

## 2019-02-07 MED ORDER — GELATIN ABSORBABLE 12-7 MM EX MISC
1.0000 | Freq: Once | CUTANEOUS | Status: AC
  Administered 2019-02-07: 15:00:00 1 via TOPICAL
  Filled 2019-02-07: qty 1

## 2019-02-07 MED ORDER — MIDAZOLAM HCL 2 MG/2ML IJ SOLN
INTRAMUSCULAR | Status: AC | PRN
  Administered 2019-02-07: 1 mg via INTRAVENOUS
  Administered 2019-02-07 (×2): 0.5 mg via INTRAVENOUS

## 2019-02-07 MED ORDER — LIDOCAINE HCL (PF) 1 % IJ SOLN
INTRAMUSCULAR | Status: AC
  Filled 2019-02-07: qty 30

## 2019-02-07 MED ORDER — LIDOCAINE HCL (PF) 1 % IJ SOLN
INTRAMUSCULAR | Status: AC | PRN
  Administered 2019-02-07: 10 mL

## 2019-02-07 MED ORDER — DEXTROSE 50 % IV SOLN
1.0000 | Freq: Once | INTRAVENOUS | Status: AC
  Administered 2019-02-07: 50 mL via INTRAVENOUS
  Filled 2019-02-07: qty 50

## 2019-02-07 NOTE — Consult Note (Signed)
Chief Complaint: Patient was seen in consultation today for image guided liver lesion biopsy Chief Complaint  Patient presents with  . Back Pain    Referring Physician(s): Sherrill,B  Supervising Physician: Jacqulynn Cadet  Patient Status: [facility] - In-pt  History of Present Illness: Kristine Rodriguez is a 73 y.o. female smoker with past medical history significant for diabetes, remote left-sided breast cancer, family history of pancreatic cancer, COPD who was recently admitted to [facility] with persistent abdominal/back pain, nausea, weight loss/anorexia.  Subsequent imaging revealed pancreatic mass with adjacent adenopathy, liver lesions, cholelithiasis, nonobstructing right renal calculi, prominent distal colonic diverticulosis.  CA-19-9 over 28,000.  Request now received from oncology for image guided liver lesion biopsy for further evaluation.  Past Medical History:  Diagnosis Date  . Cancer ([place])    hx of breast cancer left  . Diabetes mellitus   . Hypertension   . Tobacco use 02/04/2019    Past Surgical History:  Procedure Laterality Date  . bladder tack    . COLONOSCOPY  06/04/2012   Procedure: COLONOSCOPY;  Surgeon: Jamesetta So, MD;  Location: [facility];  Service: Gastroenterology;  Laterality: N/A;  . ESOPHAGOGASTRODUODENOSCOPY  06/04/2012   Procedure: ESOPHAGOGASTRODUODENOSCOPY (EGD);  Surgeon: Jamesetta So, MD;  Location: [facility];  Service: Gastroenterology;  Laterality: N/A;  . HERNIA REPAIR    . left mastectomy    . TONSILLECTOMY    . VESICO-VAGINAL FISTULA REPAIR      Allergies: Duricef [cefadroxil] and Tape  Medications: Prior to Admission medications   Medication Sig Start Date End Date Taking? Authorizing Provider  albuterol (VENTOLIN HFA) 108 (90 Base) MCG/ACT inhaler Inhale 2 puffs into the lungs every 4 (four) hours as needed for wheezing or shortness of breath.   Yes [provider]  ibuprofen (ADVIL) 200 MG tablet  Take 400-800 mg by mouth every 6 (six) hours as needed for moderate pain.   Yes [provider]  Insulin Glargine (TOUJEO MAX SOLOSTAR ) Inject 30 Units into the skin every morning.   Yes [provider]  metFORMIN (GLUCOPHAGE) 1000 MG tablet TAKE 1 TABLET BY MOUTH TWICE DAILY WITH FOOD Patient taking differently: Take 1,000 mg by mouth 2 (two) times daily with a meal.  01/31/19  Yes Nida, Marella Chimes, MD  OVER THE COUNTER MEDICATION Take 1 tablet by mouth 2 (two) times a day. CBD Oil   Yes [provider]  valsartan-hydrochlorothiazide (DIOVAN-HCT) 160-12.5 MG tablet TAKE 1 TABLET BY MOUTH EVERY DAY Patient taking differently: Take 1 tablet by mouth daily.  12/14/18  Yes Nida, Marella Chimes, MD  B-D ULTRAFINE III SHORT PEN 31G X 8 MM MISC USE ONCE AT BEDTIME 12/06/18   Cassandria Anger, MD  glucose blood (ONE TOUCH ULTRA TEST) test strip TEST TWICE DAILY 07/06/18   Cassandria Anger, MD  HYDROcodone-acetaminophen (NORCO/VICODIN) 5-325 MG tablet One tablet every six hours for pain.  Limit 7 days. Patient not taking: Reported on 02/05/2019 01/10/19   Sanjuana Kava, MD     Family History  Problem Relation Age of Onset  . Hypertension Mother   . Pancreatic cancer Mother 9  . Diabetes Father   . Hypertension Father   . Liver cancer Father   . Pancreatic cancer Sister 34    Social History   Socioeconomic History  . Marital status: Divorced    Spouse name: Not on file  . Number of children: Not on file  . Years of education:  Not on file  . Highest education level: Not on file  Occupational History  . Not on file  Social Needs  . Financial resource strain: Not on file  . Food insecurity:    Worry: Not on file    Inability: Not on file  . Transportation needs:    Medical: Not on file    Non-medical: Not on file  Tobacco Use  . Smoking status: Current Every Day Smoker    Packs/day: 0.50    Types: Cigarettes  . Smokeless tobacco: Never Used   Substance and Sexual Activity  . Alcohol use: Yes    Comment: socially drink  . Drug use: No  . Sexual activity: Yes  Lifestyle  . Physical activity:    Days per week: Not on file    Minutes per session: Not on file  . Stress: Not on file  Relationships  . Social connections:    Talks on phone: Not on file    Gets together: Not on file    Attends religious service: Not on file    Active member of club or organization: Not on file    Attends meetings of clubs or organizations: Not on file    Relationship status: Not on file  Other Topics Concern  . Not on file  Social History Narrative  . Not on file     Review of Systems see above; denies fever, headache, chest pain or bleeding.  She does have some dyspnea with exertion, occasional cough.  Vital Signs: BP 127/67 (BP Location: Right Arm)   Pulse 74   Temp 98.4 F (36.9 C) (Oral)   Resp 19   Ht 5\' 1"  (1.549 m)   Wt 121 lb 4.8 oz (55 kg)   SpO2 97%   BMI 22.92 kg/m   Physical Exam awake, alert.  Chest with distant breath sounds bilaterally.  Heart with regular rate and rhythm.  Abdomen soft, positive bowel sounds, epigastric tenderness to palpation; ext with FROM  Imaging: Dg Chest 2 View  Result Date: 02/05/2019 CLINICAL DATA:  Pancreatic mass EXAM: CHEST - 2 VIEW COMPARISON:  10/24/2008 FINDINGS: There are ill-defined focal opacities at the right lung base which may be related to the ribs. Underlying lung nodules are not excluded. Lungs are hyperaerated with interstitial prominence. Normal heart size. IMPRESSION: Possible right basilar lung nodules.  CT is warranted. Electronically Signed   By: Marybelle Killings M.D.   On: 02/05/2019 14:50   Mr Thoracic Spine Wo Contrast  Result Date: 02/04/2019 CLINICAL DATA:  Acute midline thoracic back pain. Chronic bilateral lower back pain without radiculopathy. EXAM: MRI THORACIC AND LUMBAR SPINE WITHOUT CONTRAST TECHNIQUE: Multiplanar and multiecho pulse sequences of the thoracic and  lumbar spine were obtained without intravenous contrast. COMPARISON:  Thoracic and lumbar spine x-rays dated December 28, 2018. FINDINGS: MRI THORACIC SPINE FINDINGS Alignment:  Physiologic. Vertebrae: No fracture, evidence of discitis, or bone lesion. Cord:  Normal signal and morphology. Paraspinal and other soft tissues: There are a few small pulmonary nodules in the right upper lobe measuring up to 5 mm. Otherwise negative. Disc levels: No significant disc bulge or herniation. No spinal canal or neuroforaminal stenosis. MRI LUMBAR SPINE FINDINGS Segmentation:  Standard. Alignment:  Physiologic. Vertebrae:  No fracture, evidence of discitis, or bone lesion. Conus medullaris and cauda equina: Conus extends to the L1-L2 level. Conus and cauda equina appear normal. Paraspinal and other soft tissues: Negative. Disc levels: T12-L1 to L3-L4: No significant disc bulge or herniation. No  spinal canal or neuroforaminal stenosis. L4-L5: Minimal disc bulging. Mild right and minimal left facet arthropathy. No stenosis. L5-S1: Negative disc. Mild bilateral facet arthropathy. No stenosis. IMPRESSION: Thoracic spine: 1. No acute osseous abnormality or significant degenerative changes. 2. There are few small pulmonary nodules in the right upper lobe measuring up to 5 mm. Recommend non-emergent CT chest without contrast for further evaluation. Lumbar spine: 1.  No acute osseous abnormality. 2. Mild lower lumbar facet arthropathy.  No stenosis or impingement. Electronically Signed   By: Titus Dubin M.D.   On: 02/04/2019 17:22   Mr Lumbar Spine Wo Contrast  Result Date: 02/04/2019 CLINICAL DATA:  Acute midline thoracic back pain. Chronic bilateral lower back pain without radiculopathy. EXAM: MRI THORACIC AND LUMBAR SPINE WITHOUT CONTRAST TECHNIQUE: Multiplanar and multiecho pulse sequences of the thoracic and lumbar spine were obtained without intravenous contrast. COMPARISON:  Thoracic and lumbar spine x-rays dated December 28, 2018. FINDINGS: MRI THORACIC SPINE FINDINGS Alignment:  Physiologic. Vertebrae: No fracture, evidence of discitis, or bone lesion. Cord:  Normal signal and morphology. Paraspinal and other soft tissues: There are a few small pulmonary nodules in the right upper lobe measuring up to 5 mm. Otherwise negative. Disc levels: No significant disc bulge or herniation. No spinal canal or neuroforaminal stenosis. MRI LUMBAR SPINE FINDINGS Segmentation:  Standard. Alignment:  Physiologic. Vertebrae:  No fracture, evidence of discitis, or bone lesion. Conus medullaris and cauda equina: Conus extends to the L1-L2 level. Conus and cauda equina appear normal. Paraspinal and other soft tissues: Negative. Disc levels: T12-L1 to L3-L4: No significant disc bulge or herniation. No spinal canal or neuroforaminal stenosis. L4-L5: Minimal disc bulging. Mild right and minimal left facet arthropathy. No stenosis. L5-S1: Negative disc. Mild bilateral facet arthropathy. No stenosis. IMPRESSION: Thoracic spine: 1. No acute osseous abnormality or significant degenerative changes. 2. There are few small pulmonary nodules in the right upper lobe measuring up to 5 mm. Recommend non-emergent CT chest without contrast for further evaluation. Lumbar spine: 1.  No acute osseous abnormality. 2. Mild lower lumbar facet arthropathy.  No stenosis or impingement. Electronically Signed   By: Titus Dubin M.D.   On: 02/04/2019 17:22   Ct Renal Stone Study  Result Date: 02/04/2019 CLINICAL DATA:  Low back pain with nausea for 4 months. History of left breast cancer and diabetes EXAM: CT ABDOMEN AND PELVIS WITHOUT CONTRAST TECHNIQUE: Multidetector CT imaging of the abdomen and pelvis was performed following the standard protocol without IV contrast. COMPARISON:  None. FINDINGS: Lower chest: Mild emphysema and scattered scarring in both lung bases. No significant pleural or pericardial effusion. There is a small hiatal hernia. Hepatobiliary: There are  several ill-defined attic lesions which are highly suspicious for metastatic disease. Largest lesion is located inferiorly in the right hepatic lobe (segment 6), measuring 2.4 x 2.1 cm on image 20/2. There is a small gallstone. No evidence of gallbladder wall thickening, surrounding inflammation or biliary dilatation. Pancreas: There is a large mass involving the pancreatic body and tail, measuring up to 6.3 x 5.2 cm on image 16/2. This partially encases the celiac trunk and is highly suspicious for pancreatic adenocarcinoma. Spleen: Normal in size without focal abnormality. Adrenals/Urinary Tract: Minimal nodularity of both adrenal glands, nonspecific. There are several nonobstructing small right renal calculi. No evidence of ureteral calculus or hydronephrosis. Probable pelvic floor laxity. The bladder otherwise appears normal. Stomach/Bowel: No evidence of bowel wall thickening, distention or surrounding inflammatory change. Moderate diverticular changes in the distal colon.  The appendix appears normal. Vascular/Lymphatic: There are several mildly enlarged lymph nodes in the upper retroperitoneum adjacent to the pancreatic mass, suspicious for metastatic disease. Largest is a left periaortic node measuring 15 mm on image 21/2. There is mild aortic and branch vessel atherosclerosis. As above, the pancreatic mass partially encases the celiac trunk as well as the splenic vessels. Reproductive: The uterus and ovaries appear normal. No adnexal mass. Probable pelvic floor laxity. Other: No ascites or peritoneal nodular postsurgical changes in the low anterior abdominal wall. Musculoskeletal: There is deformity of the left pelvic bones which may be developmental or posttraumatic. No acute osseous findings or signs of osseous metastatic disease. IMPRESSION: 1. Large mass involving the pancreatic body and tail with probable vascular encasement, adjacent adenopathy and hepatic metastases consistent with metastatic  pancreatic adenocarcinoma. Tissue sampling recommended. 2. Cholelithiasis without evidence of cholecystitis or biliary dilatation. 3. Nonobstructing right renal calculi. 4. Prominent distal colonic diverticulosis without acute inflammation. 5. No clear explanation for back pain. 6.  Aortic Atherosclerosis (ICD10-I70.0). Electronically Signed   By: Richardean Sale M.D.   On: 02/04/2019 20:18   [place] Ekg Site Rite  Result Date: 02/05/2019 If Site Rite image not attached, placement could not be confirmed due to current cardiac rhythm.   Labs:  CBC: Recent Labs    02/04/19 1721 02/06/19 0558 02/07/19 0551  WBC 13.2* 8.1 8.9  HGB 14.0 10.2* 11.4*  HCT 43.2 32.5* 36.5  PLT 407* 268 284    COAGS: Recent Labs    02/04/19 2213 02/07/19 0551  INR 1.1 0.9    BMP: Recent Labs    12/22/18 1434 02/04/19 1721 02/06/19 0558 02/07/19 0551  NA 136 131* 138 141  K 4.7 4.1 3.9 4.0  CL 96 95* 115* 115*  CO2 19* 17* 17* 19*  GLUCOSE 103* 245* 75 74  BUN 22 93* 51* 39*  CALCIUM 9.8 9.6 7.7* 8.2*  CREATININE 0.96 2.59* 1.31* 1.10*  GFRNONAA 60 18* 41* 50*  GFRAA 69 21* 47* 58*    LIVER FUNCTION TESTS: Recent Labs    05/20/18 1045 09/20/18 1311 12/22/18 1434 02/04/19 1721  BILITOT 0.4 0.2 <0.2 0.5  AST 10 10 14 15   ALT 10 10 14 18   ALKPHOS 66 74 94 84  PROT 7.2 7.1 6.6 7.8  ALBUMIN 4.4 4.3 4.1 4.1    TUMOR MARKERS: No results for input(s): AFPTM, CEA, CA199, CHROMGRNA in the last 8760 hours.  Assessment and Plan: 73 y.o. female smoker with past medical history significant for diabetes, remote left-sided breast cancer, family history of pancreatic cancer, COPD who was recently admitted to [facility] with persistent abdominal/back pain, nausea, weight loss/anorexia.  Subsequent imaging revealed pancreatic mass with adjacent adenopathy, liver lesions, cholelithiasis, nonobstructing right renal calculi, prominent distal colonic diverticulosis.  CA-19-9 over 28,000. Urine  cx with e coli.   Request now received from oncology for image guided liver lesion biopsy for further evaluation.  Imaging studies have been reviewed by Dr. Laurence Ferrari.Risks and benefits of procedure was discussed with the patient  including, but not limited to bleeding, infection, damage to adjacent structures or low yield requiring additional tests.  All of the questions were answered and there is agreement to proceed.  Consent signed and in chart.     Thank you for this interesting consult.  I greatly enjoyed meeting SAYLER MICKIEWICZ and look forward to participating in their care.  A copy of this report was sent to the requesting provider on this  date.  Electronically Signed: D. Rowe Robert, PA-C 02/07/2019, 10:59 AM   I spent a total of  20 minutes  in face to face in clinical consultation, greater than 50% of which was counseling/coordinating care for image guided liver lesion biopsy

## 2019-02-07 NOTE — Telephone Encounter (Signed)
Received a staff msg from Dr. Hillery Aldo to schedule a hospital followup for the pt to see him or Lattie Haw 4/29. Appt time is at 1030am w/Lisa.

## 2019-02-07 NOTE — Procedures (Signed)
Interventional Radiology Procedure Note  Procedure: US guided core biopsy of liver lesion.   Complications: None  Estimated Blood Loss: None.   Recommendations: - Return to floor - Bedrest x 2 hrs - Path is pending  Signed,  Criselda Peaches, MD

## 2019-02-07 NOTE — Progress Notes (Addendum)
[facility]                                                                              Patient Demographics  Kristine Rodriguez, is a 73 y.o. female, DOB - [birth year], XBL:[id number]  Admit date - 02/04/2019   Admitting Physician Reubin Milan, MD  Outpatient Primary MD for the patient is Sharilyn Sites, MD  Outpatient specialists:   LOS - 3  days   Medical records reviewed and are as summarized below:    Chief Complaint  Patient presents with   Back Pain       Brief summary   Patient is a 73 year old female with history of breast cancer, type 2 diabetes, hypertension, hyperlipidemia, tobacco use presented to ED with progressively worsening mid back pain since December last year.  Per patient, since then she has had decreased appetite, nausea, weight loss, dizziness, generalized weakness.  Denied any melena but occasional mild hematochezia from hemorrhoids.  She also reported complaints of decreased urination and volume.  No dysuria or flank pain or fevers or chills.  Patient had been waiting to get an MRI thoracic and lumbar spine outpatient, delayed due to COVID-19 pandemic. X-ray of the thoracic and lumbar spine in March 2020 had shown possible compression fracture of T9  In ED, CO2 17, glucose 245, BUN 93, creatinine 2.59, baseline 0.96 in March 2020, lactic acidosis with lactic acid 2.5, WBCs 13.2.  UA positive for UTI and ketones. CT renal stone study showed pancreatic mass with hepatic metastasis   Assessment & Plan    Principal Problem:   Pancreatic mass, new diagnosis with hepatic metastasis -CT of the renal stone study showed large mass involving the pancreatic body and tail with probable vascular encasement, adjacent adenopathy and hepatic metastasis consistent with metastatic pancreatic adenocarcinoma.  Cholelithiasis without evidence of cholecystitis or biliary dilatation. -Patient reports history of pancreatic ca in her mother and deceased sister,  will need genetic testing -CA-19-9 in process, CEA elevated 182, AFP normal  -Plan for liver biopsy today, n.p.o.  Active Problems: Sepsis secondary to UTI, gram-negative rod UTI -Patient met sepsis criteria at the time of admission with acute kidney injury, UTI, multiple metabolic abnormalities, lactic acidosis -Urine culture shows more than 100,000 colonies of gram-negative rods, follow sensitivities -Day #3 of IV aztreonam, if patient is not discharged, likely will not need any further antibiotics after last dose today.    Acute kidney injury with lactic acidosis -Creatinine 2.59 at the time of admission, 0.96 baseline in March 2020 -Continue IV fluid hydration, no obstructive uropathy. -Lactic acid resolved -Creatinine improved 1.1  Hyponatremia -Likely due to  sepsis, HCTZ and dehydration -Continue IV fluid hydration -Resolved     Uncontrolled type 2 diabetes mellitus with hypoglycemia (HCC) -Hemoglobin A1c 8.5 on 12/22/2018, follows endocrinology outpatient -Patient continues to have hypoglycemia, Lantus discontinued, no metformin at the time of discharge, continue sliding scale insulin, nutritional supplements.   Placed on D5 half-normal saline until patient back on diet    Essential hypertension, benign -BP soft, continue to hold valsartan HCTZ  Acute on chronic back pain -Likely due to #1, MRI of the  thoracic spine and lumbar spine showed no abnormality or degenerative changes, no compression fracture. -States back pain better with current pain medications regimen   Code Status: Full code DVT Prophylaxis:   Lovenox on hold for biopsy  Family Communication: Discussed in detail with the patient, all imaging results, lab results explained to the patient   Disposition Plan: Awaiting liver biopsy  Time Spent in minutes 25 minutes  Procedures:  CT renal stone study MRI of the thoracic and lumbar spine  Consultants:   Interventional  radiology Oncology  Antimicrobials:   Anti-infectives (From admission, onward)   Start     Dose/Rate Route Frequency Ordered Stop   02/06/19 2200  levofloxacin (LEVAQUIN) IVPB 500 mg  Status:  Discontinued     500 mg 100 mL/hr over 60 Minutes Intravenous Every 48 hours 02/04/19 2128 02/05/19 0314   02/05/19 1200  aztreonam (AZACTAM) 0.5 g in dextrose 5 % 50 mL IVPB     0.5 g 100 mL/hr over 30 Minutes Intravenous Every 8 hours 02/05/19 0859     02/04/19 2330  aztreonam (AZACTAM) 1 g in sodium chloride 0.9 % 100 mL IVPB     1 g 200 mL/hr over 30 Minutes Intravenous  Once 02/04/19 2324 02/05/19 0311   02/04/19 2130  levofloxacin (LEVAQUIN) IVPB 500 mg     500 mg 100 mL/hr over 60 Minutes Intravenous  Once 02/04/19 2119 02/04/19 2220   02/04/19 2045  sulfamethoxazole-trimethoprim (BACTRIM DS) 800-160 MG per tablet 1 tablet  Status:  Discontinued     1 tablet Oral  Once 02/04/19 2041 02/04/19 2103         Medications  Scheduled Meds:  feeding supplement (ENSURE ENLIVE)  237 mL Oral TID BM   insulin aspart  0-9 Units Subcutaneous TID WC   lidocaine  1 patch Transdermal Q24H   multivitamin with minerals  1 tablet Oral Daily   Continuous Infusions:  aztreonam 0.5 g (02/07/19 0425)   dextrose 5 % and 0.45% NaCl 50 mL/hr at 02/07/19 1054   PRN Meds:.acetaminophen **OR** acetaminophen, albuterol, HYDROmorphone (DILAUDID) injection, ondansetron **OR** ondansetron (ZOFRAN) IV, oxyCODONE-acetaminophen      Subjective:   Kristine Rodriguez was seen and examined today.  Still CBGs low, 50 this morning, likely due to poor appetite.  No nausea or vomiting.  Has upper abdominal and back pain.   Patient denies dizziness, chest pain, shortness of breath,  new weakness, numbess, tingling. No fevers.  Objective:   Vitals:   02/06/19 1430 02/06/19 2043 02/07/19 0516 02/07/19 0842  BP:  112/66 127/67   Pulse:  85 74   Resp:  20 19   Temp:  98.4 F (36.9 C) 98.4 F (36.9 C)   TempSrc:   Oral Oral   SpO2:  98% 100% 97%  Weight: 55 kg     Height:        Intake/Output Summary (Last 24 hours) at 02/07/2019 1141 Last data filed at 02/07/2019 0700 Gross per 24 hour  Intake 2244.09 ml  Output 1 ml  Net 2243.09 ml     Wt Readings from Last 3 Encounters:  02/06/19 55 kg  12/28/18 56.7 kg  12/28/18 57.6 kg    Physical Exam  General: Alert and oriented x 3, NAD  Eyes:  HEENT:  Atraumatic, normocephalic  Cardiovascular: S1 S2 clear, RRR. No pedal edema b/l  Respiratory: CTAB, no wheezing, rales or rhonchi  Gastrointestinal: Soft, mild tenderness in the upper abdomen, nondistended, NBS  Ext: no pedal edema  bilaterally  Neuro: no new deficits  Musculoskeletal: No cyanosis, clubbing  Skin: No rashes  Psych: Normal affect and demeanor, alert and oriented x3      Data Reviewed:  I have personally reviewed following labs and imaging studies  Micro Results Recent Results (from the past 240 hour(s))  Urine Culture     Status: Abnormal   Collection Time: 02/04/19  7:39 PM  Result Value Ref Range Status   Specimen Description   Final    URINE, CLEAN CATCH Performed at [facility], [address], [place], [place] [address]    Special Requests   Final    NONE Performed at [facility], [address] [place], [place] [address]    Culture >=100,000 COLONIES/mL ESCHERICHIA COLI (A)  Final   Report Status 02/07/2019 FINAL  Final   Organism ID, Bacteria ESCHERICHIA COLI (A)  Final      Susceptibility   Escherichia coli - MIC*    AMPICILLIN >=32 RESISTANT Resistant     CEFAZOLIN <=4 SENSITIVE Sensitive     CEFTRIAXONE <=1 SENSITIVE Sensitive     CIPROFLOXACIN >=4 RESISTANT Resistant     GENTAMICIN <=1 SENSITIVE Sensitive     IMIPENEM <=0.25 SENSITIVE Sensitive     NITROFURANTOIN <=16 SENSITIVE Sensitive     TRIMETH/SULFA <=20 SENSITIVE Sensitive     AMPICILLIN/SULBACTAM >=32 RESISTANT Resistant     PIP/TAZO <=4 SENSITIVE Sensitive     Extended ESBL  NEGATIVE Sensitive     * >=100,000 COLONIES/mL ESCHERICHIA COLI    Radiology Reports Dg Chest 2 View  Result Date: 02/05/2019 CLINICAL DATA:  Pancreatic mass EXAM: CHEST - 2 VIEW COMPARISON:  10/24/2008 FINDINGS: There are ill-defined focal opacities at the right lung base which may be related to the ribs. Underlying lung nodules are not excluded. Lungs are hyperaerated with interstitial prominence. Normal heart size. IMPRESSION: Possible right basilar lung nodules.  CT is warranted. Electronically Signed   By: Marybelle Killings M.D.   On: 02/05/2019 14:50   Mr Thoracic Spine Wo Contrast  Result Date: 02/04/2019 CLINICAL DATA:  Acute midline thoracic back pain. Chronic bilateral lower back pain without radiculopathy. EXAM: MRI THORACIC AND LUMBAR SPINE WITHOUT CONTRAST TECHNIQUE: Multiplanar and multiecho pulse sequences of the thoracic and lumbar spine were obtained without intravenous contrast. COMPARISON:  Thoracic and lumbar spine x-rays dated December 28, 2018. FINDINGS: MRI THORACIC SPINE FINDINGS Alignment:  Physiologic. Vertebrae: No fracture, evidence of discitis, or bone lesion. Cord:  Normal signal and morphology. Paraspinal and other soft tissues: There are a few small pulmonary nodules in the right upper lobe measuring up to 5 mm. Otherwise negative. Disc levels: No significant disc bulge or herniation. No spinal canal or neuroforaminal stenosis. MRI LUMBAR SPINE FINDINGS Segmentation:  Standard. Alignment:  Physiologic. Vertebrae:  No fracture, evidence of discitis, or bone lesion. Conus medullaris and cauda equina: Conus extends to the L1-L2 level. Conus and cauda equina appear normal. Paraspinal and other soft tissues: Negative. Disc levels: T12-L1 to L3-L4: No significant disc bulge or herniation. No spinal canal or neuroforaminal stenosis. L4-L5: Minimal disc bulging. Mild right and minimal left facet arthropathy. No stenosis. L5-S1: Negative disc. Mild bilateral facet arthropathy. No stenosis.  IMPRESSION: Thoracic spine: 1. No acute osseous abnormality or significant degenerative changes. 2. There are few small pulmonary nodules in the right upper lobe measuring up to 5 mm. Recommend non-emergent CT chest without contrast for further evaluation. Lumbar spine: 1.  No acute osseous abnormality. 2. Mild lower lumbar facet  arthropathy.  No stenosis or impingement. Electronically Signed   By: Titus Dubin M.D.   On: 02/04/2019 17:22   Mr Lumbar Spine Wo Contrast  Result Date: 02/04/2019 CLINICAL DATA:  Acute midline thoracic back pain. Chronic bilateral lower back pain without radiculopathy. EXAM: MRI THORACIC AND LUMBAR SPINE WITHOUT CONTRAST TECHNIQUE: Multiplanar and multiecho pulse sequences of the thoracic and lumbar spine were obtained without intravenous contrast. COMPARISON:  Thoracic and lumbar spine x-rays dated December 28, 2018. FINDINGS: MRI THORACIC SPINE FINDINGS Alignment:  Physiologic. Vertebrae: No fracture, evidence of discitis, or bone lesion. Cord:  Normal signal and morphology. Paraspinal and other soft tissues: There are a few small pulmonary nodules in the right upper lobe measuring up to 5 mm. Otherwise negative. Disc levels: No significant disc bulge or herniation. No spinal canal or neuroforaminal stenosis. MRI LUMBAR SPINE FINDINGS Segmentation:  Standard. Alignment:  Physiologic. Vertebrae:  No fracture, evidence of discitis, or bone lesion. Conus medullaris and cauda equina: Conus extends to the L1-L2 level. Conus and cauda equina appear normal. Paraspinal and other soft tissues: Negative. Disc levels: T12-L1 to L3-L4: No significant disc bulge or herniation. No spinal canal or neuroforaminal stenosis. L4-L5: Minimal disc bulging. Mild right and minimal left facet arthropathy. No stenosis. L5-S1: Negative disc. Mild bilateral facet arthropathy. No stenosis. IMPRESSION: Thoracic spine: 1. No acute osseous abnormality or significant degenerative changes. 2. There are few small  pulmonary nodules in the right upper lobe measuring up to 5 mm. Recommend non-emergent CT chest without contrast for further evaluation. Lumbar spine: 1.  No acute osseous abnormality. 2. Mild lower lumbar facet arthropathy.  No stenosis or impingement. Electronically Signed   By: Titus Dubin M.D.   On: 02/04/2019 17:22   Ct Renal Stone Study  Result Date: 02/04/2019 CLINICAL DATA:  Low back pain with nausea for 4 months. History of left breast cancer and diabetes EXAM: CT ABDOMEN AND PELVIS WITHOUT CONTRAST TECHNIQUE: Multidetector CT imaging of the abdomen and pelvis was performed following the standard protocol without IV contrast. COMPARISON:  None. FINDINGS: Lower chest: Mild emphysema and scattered scarring in both lung bases. No significant pleural or pericardial effusion. There is a small hiatal hernia. Hepatobiliary: There are several ill-defined attic lesions which are highly suspicious for metastatic disease. Largest lesion is located inferiorly in the right hepatic lobe (segment 6), measuring 2.4 x 2.1 cm on image 20/2. There is a small gallstone. No evidence of gallbladder wall thickening, surrounding inflammation or biliary dilatation. Pancreas: There is a large mass involving the pancreatic body and tail, measuring up to 6.3 x 5.2 cm on image 16/2. This partially encases the celiac trunk and is highly suspicious for pancreatic adenocarcinoma. Spleen: Normal in size without focal abnormality. Adrenals/Urinary Tract: Minimal nodularity of both adrenal glands, nonspecific. There are several nonobstructing small right renal calculi. No evidence of ureteral calculus or hydronephrosis. Probable pelvic floor laxity. The bladder otherwise appears normal. Stomach/Bowel: No evidence of bowel wall thickening, distention or surrounding inflammatory change. Moderate diverticular changes in the distal colon. The appendix appears normal. Vascular/Lymphatic: There are several mildly enlarged lymph nodes in  the upper retroperitoneum adjacent to the pancreatic mass, suspicious for metastatic disease. Largest is a left periaortic node measuring 15 mm on image 21/2. There is mild aortic and branch vessel atherosclerosis. As above, the pancreatic mass partially encases the celiac trunk as well as the splenic vessels. Reproductive: The uterus and ovaries appear normal. No adnexal mass. Probable pelvic floor laxity. Other: No ascites  or peritoneal nodular postsurgical changes in the low anterior abdominal wall. Musculoskeletal: There is deformity of the left pelvic bones which may be developmental or posttraumatic. No acute osseous findings or signs of osseous metastatic disease. IMPRESSION: 1. Large mass involving the pancreatic body and tail with probable vascular encasement, adjacent adenopathy and hepatic metastases consistent with metastatic pancreatic adenocarcinoma. Tissue sampling recommended. 2. Cholelithiasis without evidence of cholecystitis or biliary dilatation. 3. Nonobstructing right renal calculi. 4. Prominent distal colonic diverticulosis without acute inflammation. 5. No clear explanation for back pain. 6.  Aortic Atherosclerosis (ICD10-I70.0). Electronically Signed   By: Richardean Sale M.D.   On: 02/04/2019 20:18   [place] Ekg Site Rite  Result Date: 02/05/2019 If Site Rite image not attached, placement could not be confirmed due to current cardiac rhythm.   Lab Data:  CBC: Recent Labs  Lab 02/04/19 1721 02/06/19 0558 02/07/19 0551  WBC 13.2* 8.1 8.9  NEUTROABS  --  5.5 5.4  HGB 14.0 10.2* 11.4*  HCT 43.2 32.5* 36.5  MCV 90.0 95.6 95.3  PLT 407* 268 440   Basic Metabolic Panel: Recent Labs  Lab 02/04/19 1721 02/06/19 0558 02/07/19 0551  NA 131* 138 141  K 4.1 3.9 4.0  CL 95* 115* 115*  CO2 17* 17* 19*  GLUCOSE 245* 75 74  BUN 93* 51* 39*  CREATININE 2.59* 1.31* 1.10*  CALCIUM 9.6 7.7* 8.2*   GFR: Estimated Creatinine Clearance: 35.4 mL/min (A) (by C-G formula based on SCr  of 1.1 mg/dL (H)). Liver Function Tests: Recent Labs  Lab 02/04/19 1721  AST 15  ALT 18  ALKPHOS 84  BILITOT 0.5  PROT 7.8  ALBUMIN 4.1   Recent Labs  Lab 02/04/19 1721  LIPASE 33   No results for input(s): AMMONIA in the last 168 hours. Coagulation Profile: Recent Labs  Lab 02/04/19 2213 02/07/19 0551  INR 1.1 0.9   Cardiac Enzymes: No results for input(s): CKTOTAL, CKMB, CKMBINDEX, TROPONINI in the last 168 hours. BNP (last 3 results) No results for input(s): PROBNP in the last 8760 hours. HbA1C: No results for input(s): HGBA1C in the last 72 hours. CBG: Recent Labs  Lab 02/06/19 1125 02/06/19 1707 02/06/19 2045 02/07/19 0731 02/07/19 1126  GLUCAP 94 149* 159* 50* 87   Lipid Profile: No results for input(s): CHOL, HDL, LDLCALC, TRIG, CHOLHDL, LDLDIRECT in the last 72 hours. Thyroid Function Tests: No results for input(s): TSH, T4TOTAL, FREET4, T3FREE, THYROIDAB in the last 72 hours. Anemia Panel: No results for input(s): VITAMINB12, FOLATE, FERRITIN, TIBC, IRON, RETICCTPCT in the last 72 hours. Urine analysis:    Component Value Date/Time   COLORURINE YELLOW 02/04/2019 1939   APPEARANCEUR CLOUDY (A) 02/04/2019 1939   LABSPEC 1.015 02/04/2019 1939   PHURINE 5.0 02/04/2019 1939   GLUCOSEU NEGATIVE 02/04/2019 1939   HGBUR SMALL (A) 02/04/2019 1939   BILIRUBINUR NEGATIVE 02/04/2019 1939   KETONESUR 5 (A) 02/04/2019 1939   PROTEINUR 100 (A) 02/04/2019 1939   UROBILINOGEN 1.0 09/16/2007 1009   NITRITE NEGATIVE 02/04/2019 1939   LEUKOCYTESUR LARGE (A) 02/04/2019 1939     Kristine Rodriguez M.D. [facility] 02/07/2019, 11:41 AM  Pager: [phone] Between 7am to 7pm - call Pager - [phone]  After 7pm go to [email or web address] - password [email or web address]  Call night coverage person covering after 7pm

## 2019-02-07 NOTE — Progress Notes (Signed)
MEDICATION-RELATED CONSULT NOTE   IR Procedure Consult - Anticoagulant/Antiplatelet PTA/Inpatient Med List Review by Pharmacist    Procedure: liver lesion biopsy    Completed: 4/27 ~1500  Post-Procedural bleeding risk per IR MD assessment:    Antithrombotic medications on inpatient or PTA profile prior to procedure:   Lovenox    Recommended restart time per IR Post-Procedure Guidelines:  Next AM 4/28   Other considerations:      Plan:    Restart Lovenox 40mg  SQ daily tomorrow 4/28 AM   Adrian Saran, PharmD, BCPS 02/07/2019 3:08 PM

## 2019-02-07 NOTE — Care Management Important Message (Signed)
Important Message  Patient Details IM Letter given to Prudy Feeler SW to present to the Patient Name: Kristine Rodriguez MRN: [id number] Date of Birth: Oct 06, 1946   Medicare Important Message Given:  Yes    Kerin Salen 02/07/2019, 11:47 AM

## 2019-02-08 ENCOUNTER — Inpatient Hospital Stay (HOSPITAL_COMMUNITY): Payer: PPO

## 2019-02-08 DIAGNOSIS — I82409 Acute embolism and thrombosis of unspecified deep veins of unspecified lower extremity: Secondary | ICD-10-CM

## 2019-02-08 LAB — CBC WITH DIFFERENTIAL/PLATELET
Abs Immature Granulocytes: 0.1 10*3/uL — ABNORMAL HIGH (ref 0.00–0.07)
Basophils Absolute: 0.1 10*3/uL (ref 0.0–0.1)
Basophils Relative: 1 %
Eosinophils Absolute: 0.4 10*3/uL (ref 0.0–0.5)
Eosinophils Relative: 4 %
HCT: 34.9 % — ABNORMAL LOW (ref 36.0–46.0)
Hemoglobin: 11.5 g/dL — ABNORMAL LOW (ref 12.0–15.0)
Immature Granulocytes: 1 %
Lymphocytes Relative: 18 %
Lymphs Abs: 1.8 10*3/uL (ref 0.7–4.0)
MCH: 30 pg (ref 26.0–34.0)
MCHC: 33 g/dL (ref 30.0–36.0)
MCV: 91.1 fL (ref 80.0–100.0)
Monocytes Absolute: 0.7 10*3/uL (ref 0.1–1.0)
Monocytes Relative: 8 %
Neutro Abs: 6.8 10*3/uL (ref 1.7–7.7)
Neutrophils Relative %: 68 %
Platelets: 272 10*3/uL (ref 150–400)
RBC: 3.83 MIL/uL — ABNORMAL LOW (ref 3.87–5.11)
RDW: 15.3 % (ref 11.5–15.5)
WBC: 9.9 10*3/uL (ref 4.0–10.5)
nRBC: 0 % (ref 0.0–0.2)

## 2019-02-08 LAB — GLUCOSE, CAPILLARY
Glucose-Capillary: 89 mg/dL (ref 70–99)
Glucose-Capillary: 82 mg/dL (ref 70–99)
Glucose-Capillary: 171 mg/dL — ABNORMAL HIGH (ref 70–99)
Glucose-Capillary: 174 mg/dL — ABNORMAL HIGH (ref 70–99)

## 2019-02-08 MED ORDER — GLIMEPIRIDE 1 MG PO TABS: 1.0000 mg | ORAL_TABLET | Freq: Every day | ORAL | 4 refills | Status: DC | PRN

## 2019-02-08 MED ORDER — ONDANSETRON 4 MG PO TBDP: 4.0000 mg | ORAL_TABLET | Freq: Three times a day (TID) | ORAL | 0 refills | Status: DC | PRN

## 2019-02-08 MED ORDER — FUROSEMIDE 10 MG/ML IJ SOLN
40.0000 mg | Freq: Once | INTRAMUSCULAR | Status: AC
  Administered 2019-02-08: 10:00:00 40 mg via INTRAVENOUS
  Filled 2019-02-08: qty 4

## 2019-02-08 MED ORDER — LIDOCAINE 5 % EX PTCH: 1.0000 | MEDICATED_PATCH | CUTANEOUS | 1 refills | Status: DC

## 2019-02-08 MED ORDER — SODIUM CHLORIDE 0.9 % IV SOLN
1.0000 g | Freq: Three times a day (TID) | INTRAVENOUS | Status: DC
  Filled 2019-02-08: qty 1

## 2019-02-08 MED ORDER — ADULT MULTIVITAMIN W/MINERALS CH: 1.0000 | ORAL_TABLET | Freq: Every day | ORAL | 3 refills | Status: DC

## 2019-02-08 MED ORDER — OXYCODONE-ACETAMINOPHEN 5-325 MG PO TABS: 1.0000 | ORAL_TABLET | Freq: Four times a day (QID) | ORAL | 0 refills | Status: DC | PRN

## 2019-02-08 NOTE — Progress Notes (Signed)
Pharmacy Antibiotic Note  Kristine Rodriguez is a 73 y.o. female admitted on 02/04/2019 with UTI.  Pharmacy has been consulted for Aztreonam dosing after patient had a localized reaction to Levaquin.  Today, 02/08/19  Day 3 Aztreonam  Afebrile.  4/27 WBC stable  4/27 Scr improved - CrCl 36  Urine culture results final - Ecoli - resistant only to amp, cipro and Unasyn   Plan:  With improved renal function, will change aztreonam from 500mg  to 1g IV q8  BUT, would recommend to change aztreonam to PO Bactrim for further treatment of UTI if necessary  Height: 5\' 1"  (154.9 cm) Weight: 121 lb 4.8 oz (55 kg) IBW/kg (Calculated) : 47.8  Temp (24hrs), Avg:98.5 F (36.9 C), Min:98.4 F (36.9 C), Max:98.7 F (37.1 C)  Recent Labs  Lab 02/04/19 1721 02/04/19 2213 02/05/19 0536 02/06/19 0558 02/07/19 0551  WBC 13.2*  --   --  8.1 8.9  CREATININE 2.59*  --   --  1.31* 1.10*  LATICACIDVEN  --  2.5* 1.2  --   --     Estimated Creatinine Clearance: 35.4 mL/min (A) (by C-G formula based on SCr of 1.1 mg/dL (H)).    Allergies  Allergen Reactions  . Duricef [Cefadroxil] Shortness Of Breath and Swelling  . Tape Rash   Thank you for allowing pharmacy to be a part of this patient's care.   Adrian Saran, PharmD, BCPS 02/08/2019 8:48 AM

## 2019-02-08 NOTE — Progress Notes (Signed)
RUA Edemodous from an infiltrated PIV FROM 4/27 PM . Site is warm and mildly red. Venous ultrasound ordered

## 2019-02-08 NOTE — Discharge Summary (Signed)
Physician Discharge Summary   Patient ID: STEPH CHEADLE MRN: [id number] DOB/AGE: 11/28/1945 73 y.o.  Admit date: 02/04/2019 Discharge date: 02/08/2019  Primary Care Physician:  Sharilyn Sites, MD   Recommendations for Outpatient Follow-up:  1. Patient has appointment with oncology tomorrow 02/09/2019 at [place]: None Equipment/Devices: None  Discharge Condition: stable* CODE STATUS: FULL  Diet recommendation: Regular diet   Discharge Diagnoses:   . Sepsis due to urinary tract infection ([place]) . New pancreatic adenocarcinoma . Mild AKI (acute kidney injury) ([place]) . Acute on chronic back pain secondary to pancreatic mass . E. coli UTI . Uncontrolled type 2 diabetes mellitus with hypoglycemia ([place]) . Essential hypertension, benign . Mixed hyperlipidemia . Tobacco use . Hyponatremia    Consults:   Interventional radiology Oncology, Dr. Learta Codding    Allergies:   Allergies  Allergen Reactions  . Duricef [Cefadroxil] Shortness Of Breath and Swelling  . Tape Rash     DISCHARGE MEDICATIONS: Allergies as of 02/08/2019      Reactions   Duricef [cefadroxil] Shortness Of Breath, Swelling   Tape Rash      Medication List    STOP taking these medications   ibuprofen 200 MG tablet Commonly known as:  ADVIL   metFORMIN 1000 MG tablet Commonly known as:  GLUCOPHAGE   OVER THE COUNTER MEDICATION   TOUJEO MAX SOLOSTAR [place]   valsartan-hydrochlorothiazide 160-12.5 MG tablet Commonly known as:  DIOVAN-HCT     TAKE these medications   albuterol 108 (90 Base) MCG/ACT inhaler Commonly known as:  VENTOLIN HFA Inhale 2 puffs into the lungs every 4 (four) hours as needed for wheezing or shortness of breath.   B-D ULTRAFINE III SHORT PEN 31G X 8 MM Misc Generic drug:  Insulin Pen Needle USE ONCE AT BEDTIME   glimepiride 1 MG tablet Commonly known as:  Amaryl Take 1 tablet (1 mg total) by mouth daily as needed. Take only if blood sugars  consistently above 140's   glucose blood test strip Commonly known as:  ONE TOUCH ULTRA TEST TEST TWICE DAILY   lidocaine 5 % Commonly known as:  LIDODERM Place 1 patch onto the skin daily. Remove & Discard patch within 12 hours or as directed by MD   multivitamin with minerals Tabs tablet Take 1 tablet by mouth daily. Start taking on:  February 09, 2019   ondansetron 4 MG disintegrating tablet Commonly known as:  Zofran ODT Take 1 tablet (4 mg total) by mouth every 8 (eight) hours as needed for nausea or vomiting.   oxyCODONE-acetaminophen 5-325 MG tablet Commonly known as:  PERCOCET/ROXICET Take 1-2 tablets by mouth every 6 (six) hours as needed for moderate pain or severe pain.        Brief H and P: For complete details please refer to admission H and P, but in brief Patient is a 73 year old female with history of breast cancer, type 2 diabetes, hypertension, hyperlipidemia, tobacco use presented to ED with progressively worsening mid back pain since December last year.  Per patient, since then she has had decreased appetite, nausea, weight loss, dizziness, generalized weakness.  Denied any melena but occasional mild hematochezia from hemorrhoids.  She also reported complaints of decreased urination and volume.  No dysuria or flank pain or fevers or chills.  Patient had been waiting to get an MRI thoracic and lumbar spine outpatient, delayed due to COVID-19 pandemic. X-ray of the thoracic and lumbar spine in March 2020 had shown possible compression  fracture of T9  In ED, CO2 17, glucose 245, BUN 93, creatinine 2.59, baseline 0.96 in March 2020, lactic acidosis with lactic acid 2.5, WBCs 13.2.  UA positive for UTI and ketones. CT renal stone study showed pancreatic mass with hepatic metastasis   Hospital Course:  Pancreatic mass, new diagnosis with hepatic metastasis -CT of the renal stone study showed large mass involving the pancreatic body and tail with probable vascular  encasement, adjacent adenopathy and hepatic metastasis consistent with metastatic pancreatic adenocarcinoma.  Cholelithiasis without evidence of cholecystitis or biliary dilatation. -Patient reports history of pancreatic ca in her mother and deceased sister, will need genetic testing -CA-19-9 elevated 28,204, CEA elevated 182, AFP normal  -Patient underwent liver biopsy, results still pending, patient has appointment with oncology tomorrow 4/29, Dr. Learta Codding will follow results of the biopsy.    Sepsis secondary to E. coli UTI -Patient met sepsis criteria at the time of admission with acute kidney injury, UTI, multiple metabolic abnormalities, lactic acidosis -Urine culture showed E. coli -Patient has completed 3 days of IV aztreonam.  She does not have any symptoms of dysuria, hematuria or cystitis.  Does not need any further antibiotics.  Acute kidney injury with lactic acidosis -Creatinine 2.59 at the time of admission, 0.96 baseline in March 2020 -Continue IV fluid hydration, no obstructive uropathy. -Lactic acid resolved -Creatinine improved to 1.1  Hyponatremia -Likely due to  sepsis, HCTZ and dehydration -Resolved sodium 141.  Peripheral edema Patient had mild fluid overload with peripheral edema, right upper extremity edema.  Doppler ultrasound of the right arm negative for DVT. 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  Metformin had been held.  She was subsequently placed on D5 half-normal saline drip.  -Placed back on the regular diet, she has poor appetite, CBGs likely low due to malnutrition and poor appetite -Metformin, Toujeo insulin discontinued.  Patient was recommended to check her blood sugar levels at home.  Only start low-dose Amaryl 1  mg if multiple CBG readings consistently above 140s and focus on eating better, nutritional supplements.    Essential hypertension, benign -BP remained soft, valsartan HCTZ was discontinued.  Acute on chronic back pain -Likely due to #1, MRI of the thoracic spine and lumbar spine showed no abnormality or degenerative changes, no compression fracture. -States back pain better with current pain medications regimen    Day of Discharge S: Feeling better, no acute complaints.  No shortness of breath, chest pain.  Upper abdominal and back pain improving.  BP (!) 120/47 (BP Location: Right Arm)   Pulse 73   Temp 98.5 F (36.9 C) (Oral)   Resp 18   Ht _0  (1.549 m)   Wt 55 kg   SpO2 100%   BMI 22.92 kg/m   Physical Exam: General: Alert and awake oriented x3 not in any acute distress. HEENT: anicteric sclera, pupils reactive to light and accommodation CVS: S1-S2 clear no murmur rubs or gallops Chest: clear to auscultation bilaterally, no wheezing rales or rhonchi Abdomen: soft nontender, nondistended, normal bowel sounds Extremities: no cyanosis, clubbing, 1+ edema noted bilaterally Neuro: Cranial nerves II-XII intact, no focal neurological deficits   The results of significant diagnostics from this hospitalization (including imaging, microbiology, ancillary and laboratory) are listed below for reference.  Procedures/Studies:  Dg Chest 2 View  Result Date: 02/05/2019 CLINICAL DATA:  Pancreatic mass EXAM: CHEST - 2 VIEW COMPARISON:  10/24/2008 FINDINGS: There are ill-defined focal opacities at the right lung base which may be related to the ribs. Underlying lung nodules are not excluded. Lungs are hyperaerated with interstitial prominence. Normal heart size. IMPRESSION: Possible right basilar lung nodules.  CT is warranted. Electronically Signed   By: Marybelle Killings M.D.   On: 02/05/2019 14:50   Mr Thoracic Spine Wo Contrast  Result Date: 02/04/2019 CLINICAL DATA:  Acute  midline thoracic back pain. Chronic bilateral lower back pain without radiculopathy. EXAM: MRI THORACIC AND LUMBAR SPINE WITHOUT CONTRAST TECHNIQUE: Multiplanar and multiecho pulse sequences of the thoracic and lumbar spine were obtained without intravenous contrast. COMPARISON:  Thoracic and lumbar spine x-rays dated December 28, 2018. FINDINGS: MRI THORACIC SPINE FINDINGS Alignment:  Physiologic. Vertebrae: No fracture, evidence of discitis, or bone lesion. Cord:  Normal signal and morphology. Paraspinal and other soft tissues: There are a few small pulmonary nodules in the right upper lobe measuring up to 5 mm. Otherwise negative. Disc levels: No significant disc bulge or herniation. No spinal canal or neuroforaminal stenosis. MRI LUMBAR SPINE FINDINGS Segmentation:  Standard. Alignment:  Physiologic. Vertebrae:  No fracture, evidence of discitis, or bone lesion. Conus medullaris and cauda equina: Conus extends to the L1-L2 level. Conus and cauda equina appear normal. Paraspinal and other soft tissues: Negative. Disc levels: T12-L1 to L3-L4: No significant disc bulge or herniation. No spinal canal or neuroforaminal stenosis. L4-L5: Minimal disc bulging. Mild right and minimal left facet arthropathy. No stenosis. L5-S1: Negative disc. Mild bilateral facet arthropathy. No stenosis. IMPRESSION: Thoracic spine: 1. No acute osseous abnormality or significant degenerative changes. 2. There are few small pulmonary nodules in the right upper lobe measuring up to 5 mm. Recommend non-emergent CT chest without contrast for further evaluation. Lumbar spine: 1.  No acute osseous abnormality. 2. Mild lower lumbar facet arthropathy.  No stenosis or impingement. Electronically Signed   By: Titus Dubin M.D.   On: 02/04/2019 17:22   Mr Lumbar Spine Wo Contrast  Result Date: 02/04/2019 CLINICAL DATA:  Acute midline thoracic back pain. Chronic bilateral lower back pain without radiculopathy. EXAM: MRI THORACIC AND LUMBAR SPINE  WITHOUT CONTRAST TECHNIQUE: Multiplanar and multiecho pulse sequences of the thoracic and lumbar spine were obtained without intravenous contrast. COMPARISON:  Thoracic and lumbar spine x-rays dated December 28, 2018. FINDINGS: MRI THORACIC SPINE FINDINGS Alignment:  Physiologic. Vertebrae: No fracture, evidence of discitis, or bone lesion. Cord:  Normal signal and morphology. Paraspinal and other soft tissues: There are a few small pulmonary nodules in the right upper lobe measuring up to 5 mm. Otherwise negative. Disc levels: No significant disc bulge or herniation. No spinal canal or neuroforaminal stenosis. MRI LUMBAR SPINE FINDINGS Segmentation:  Standard. Alignment:  Physiologic. Vertebrae:  No fracture, evidence of discitis, or bone lesion. Conus medullaris and cauda equina: Conus extends to the L1-L2 level. Conus and cauda equina appear normal. Paraspinal and other soft tissues: Negative. Disc levels: T12-L1 to L3-L4: No significant disc bulge or herniation. No spinal canal or neuroforaminal stenosis. L4-L5: Minimal disc bulging. Mild right and minimal left facet arthropathy. No stenosis. L5-S1: Negative disc. Mild bilateral facet arthropathy. No stenosis. IMPRESSION: Thoracic spine: 1. No acute osseous abnormality or significant degenerative changes. 2. There are few small pulmonary nodules in the right upper lobe measuring up to 5 mm. Recommend non-emergent CT chest without contrast for further  evaluation. Lumbar spine: 1.  No acute osseous abnormality. 2. Mild lower lumbar facet arthropathy.  No stenosis or impingement. Electronically Signed   By: Titus Dubin M.D.   On: 02/04/2019 17:22   Ir US Guide Bx Asp/drain  Result Date: 02/07/2019 INDICATION: 73 year old female with CT evidence of pancreatic cancer and liver lesions concerning for metastatic disease. She presents for ultrasound-guided core biopsy to facilitate tissue diagnosis. EXAM: Ultrasound-guided core biopsy of liver lesion MEDICATIONS:  None. ANESTHESIA/SEDATION: Moderate (conscious) sedation was employed during this procedure. A total of Versed 2 mg and Fentanyl 100 mcg was administered intravenously. Moderate Sedation Time: 13 minutes. The patient's level of consciousness and vital signs were monitored continuously by radiology nursing throughout the procedure under my direct supervision. FLUOROSCOPY TIME:  None COMPLICATIONS: None immediate. PROCEDURE: Informed written consent was obtained from the patient after a thorough discussion of the procedural risks, benefits and alternatives. All questions were addressed. A timeout was performed prior to the initiation of the procedure. Ultrasound was used to interrogate the liver. The ill-defined mass in the inferior aspect of hepatic segment 6 was successfully identified. A suitable skin entry site was selected and marked. The overlying skin was sterilely prepped and draped in the standard sterile fashion. Local anesthesia was attained by infiltration with 1% lidocaine. A small dermatotomy was made. Under real-time ultrasound guidance, a 17 gauge introducer needle was advanced through the liver and positioned at the margin of the mass. Multiple 18 gauge core biopsies were then coaxially obtained using the bio Pince automated biopsy device. Biopsy specimens were placed in formalin and delivered to pathology for further analysis. As the introducer needle was removed, the biopsy tract was embolized with a Gel-Foam slurry. Post biopsy ultrasound imaging demonstrates no evidence of immediate complication. The patient tolerated the procedure well. IMPRESSION: Ultrasound-guided core biopsy of hepatic mass. Electronically Signed   By: Jacqulynn Cadet M.D.   On: 02/07/2019 15:08   Ct Renal Stone Study  Result Date: 02/04/2019 CLINICAL DATA:  Low back pain with nausea for 4 months. History of left breast cancer and diabetes EXAM: CT ABDOMEN AND PELVIS WITHOUT CONTRAST TECHNIQUE: Multidetector CT imaging  of the abdomen and pelvis was performed following the standard protocol without IV contrast. COMPARISON:  None. FINDINGS: Lower chest: Mild emphysema and scattered scarring in both lung bases. No significant pleural or pericardial effusion. There is a small hiatal hernia. Hepatobiliary: There are several ill-defined attic lesions which are highly suspicious for metastatic disease. Largest lesion is located inferiorly in the right hepatic lobe (segment 6), measuring 2.4 x 2.1 cm on image 20/2. There is a small gallstone. No evidence of gallbladder wall thickening, surrounding inflammation or biliary dilatation. Pancreas: There is a large mass involving the pancreatic body and tail, measuring up to 6.3 x 5.2 cm on image 16/2. This partially encases the celiac trunk and is highly suspicious for pancreatic adenocarcinoma. Spleen: Normal in size without focal abnormality. Adrenals/Urinary Tract: Minimal nodularity of both adrenal glands, nonspecific. There are several nonobstructing small right renal calculi. No evidence of ureteral calculus or hydronephrosis. Probable pelvic floor laxity. The bladder otherwise appears normal. Stomach/Bowel: No evidence of bowel wall thickening, distention or surrounding inflammatory change. Moderate diverticular changes in the distal colon. The appendix appears normal. Vascular/Lymphatic: There are several mildly enlarged lymph nodes in the upper retroperitoneum adjacent to the pancreatic mass, suspicious for metastatic disease. Largest is a left periaortic node measuring 15 mm on image 21/2. There is mild aortic and branch vessel  atherosclerosis. As above, the pancreatic mass partially encases the celiac trunk as well as the splenic vessels. Reproductive: The uterus and ovaries appear normal. No adnexal mass. Probable pelvic floor laxity. Other: No ascites or peritoneal nodular postsurgical changes in the low anterior abdominal wall. Musculoskeletal: There is deformity of the left  pelvic bones which may be developmental or posttraumatic. No acute osseous findings or signs of osseous metastatic disease. IMPRESSION: 1. Large mass involving the pancreatic body and tail with probable vascular encasement, adjacent adenopathy and hepatic metastases consistent with metastatic pancreatic adenocarcinoma. Tissue sampling recommended. 2. Cholelithiasis without evidence of cholecystitis or biliary dilatation. 3. Nonobstructing right renal calculi. 4. Prominent distal colonic diverticulosis without acute inflammation. 5. No clear explanation for back pain. 6.  Aortic Atherosclerosis (ICD10-I70.0). Electronically Signed   By: Richardean Sale M.D.   On: 02/04/2019 20:18   Vas [place] Upper Extremity Venous Duplex  Result Date: 02/08/2019 UPPER VENOUS STUDY  Indications: Edema, and Erythema Performing Technologist: June Leap RDMS, RVT  Examination Guidelines: A complete evaluation includes B-mode imaging, spectral Doppler, color Doppler, and power Doppler as needed of all accessible portions of each vessel. Bilateral testing is considered an integral part of a complete examination. Limited examinations for reoccurring indications may be performed as noted.  Right Findings: +----------+------------+---------+-----------+----------+-------+ RIGHT     CompressiblePhasicitySpontaneousPropertiesSummary +----------+------------+---------+-----------+----------+-------+ IJV           Full       Yes       Yes                      +----------+------------+---------+-----------+----------+-------+ Subclavian    Full       Yes       Yes                      +----------+------------+---------+-----------+----------+-------+ Axillary      Full       Yes       Yes                      +----------+------------+---------+-----------+----------+-------+ Brachial      Full       Yes       Yes                      +----------+------------+---------+-----------+----------+-------+ Radial         Full                                          +----------+------------+---------+-----------+----------+-------+ Ulnar         Full                                          +----------+------------+---------+-----------+----------+-------+ Cephalic      Full                                          +----------+------------+---------+-----------+----------+-------+ Basilic       Full                                          +----------+------------+---------+-----------+----------+-------+  Left Findings: +----------+------------+---------+-----------+----------+-------+ LEFT      CompressiblePhasicitySpontaneousPropertiesSummary +----------+------------+---------+-----------+----------+-------+ Subclavian               Yes       Yes                      +----------+------------+---------+-----------+----------+-------+  Summary:  Right: No evidence of deep vein thrombosis in the upper extremity. No evidence of superficial vein thrombosis in the upper extremity.  Left: No evidence of thrombosis in the subclavian.  *See table(s) above for measurements and observations.    Preliminary    [place] Ekg Site Rite  Result Date: 02/05/2019 If Site Rite image not attached, placement could not be confirmed due to current cardiac rhythm.      LAB RESULTS: Basic Metabolic Panel: Recent Labs  Lab 02/06/19 0558 02/07/19 0551  NA 138 141  K 3.9 4.0  CL 115* 115*  CO2 17* 19*  GLUCOSE 75 74  BUN 51* 39*  CREATININE 1.31* 1.10*  CALCIUM 7.7* 8.2*   Liver Function Tests: Recent Labs  Lab 02/04/19 1721  AST 15  ALT 18  ALKPHOS 84  BILITOT 0.5  PROT 7.8  ALBUMIN 4.1   Recent Labs  Lab 02/04/19 1721  LIPASE 33   No results for input(s): AMMONIA in the last 168 hours. CBC: Recent Labs  Lab 02/07/19 0551 02/08/19 0852  WBC 8.9 9.9  NEUTROABS 5.4 6.8  HGB 11.4* 11.5*  HCT 36.5 34.9*  MCV 95.3 91.1  PLT 284 272   Cardiac Enzymes: No results for  input(s): CKTOTAL, CKMB, CKMBINDEX, TROPONINI in the last 168 hours. BNP: Invalid input(s): POCBNP CBG: Recent Labs  Lab 02/08/19 0716 02/08/19 1115  GLUCAP 82 171*      Disposition and Follow-up: Discharge Instructions    Diet general   Complete by:  As directed    Discharge instructions   Complete by:  As directed    Please stop metformin and insulin.  Please check your blood sugars fasting in the morning and lunch and supper.  Keep a log of your blood sugars.  If your blood sugars are consistently running above 140s, you may take Amaryl 1 mg daily as needed.   Increase activity slowly   Complete by:  As directed        DISPOSITION: [facility] Follow up on 02/09/2019.   Why:  at 10:45AM            Time coordinating discharge:  35 minutes  Signed:   Estill Cotta M.D. [facility] 02/08/2019, 12:33 PM

## 2019-02-08 NOTE — Progress Notes (Signed)
RUE venous duplex       has been completed. Preliminary results can be found under CV proc through chart review. Lovie Agresta, BS, RDMS, RVT   

## 2019-02-09 ENCOUNTER — Encounter: Payer: Self-pay | Admitting: Nurse Practitioner

## 2019-02-09 ENCOUNTER — Other Ambulatory Visit: Payer: Self-pay

## 2019-02-09 ENCOUNTER — Inpatient Hospital Stay: Payer: PPO | Attending: Nurse Practitioner | Admitting: Nurse Practitioner

## 2019-02-09 VITALS — BP 124/59 | HR 103 | Temp 98.4°F | Resp 18 | Ht 61.0 in | Wt 123.1 lb

## 2019-02-09 DIAGNOSIS — C259 Malignant neoplasm of pancreas, unspecified: Secondary | ICD-10-CM | POA: Diagnosis not present

## 2019-02-09 DIAGNOSIS — Z853 Personal history of malignant neoplasm of breast: Secondary | ICD-10-CM | POA: Insufficient documentation

## 2019-02-09 DIAGNOSIS — Z800 Family history of malignant neoplasm of digestive organs: Secondary | ICD-10-CM | POA: Insufficient documentation

## 2019-02-09 DIAGNOSIS — E119 Type 2 diabetes mellitus without complications: Secondary | ICD-10-CM | POA: Insufficient documentation

## 2019-02-09 DIAGNOSIS — J449 Chronic obstructive pulmonary disease, unspecified: Secondary | ICD-10-CM | POA: Insufficient documentation

## 2019-02-09 DIAGNOSIS — R160 Hepatomegaly, not elsewhere classified: Secondary | ICD-10-CM | POA: Diagnosis not present

## 2019-02-09 DIAGNOSIS — C251 Malignant neoplasm of body of pancreas: Secondary | ICD-10-CM | POA: Diagnosis not present

## 2019-02-09 DIAGNOSIS — R630 Anorexia: Secondary | ICD-10-CM | POA: Insufficient documentation

## 2019-02-09 DIAGNOSIS — M549 Dorsalgia, unspecified: Secondary | ICD-10-CM | POA: Insufficient documentation

## 2019-02-09 DIAGNOSIS — C787 Secondary malignant neoplasm of liver and intrahepatic bile duct: Secondary | ICD-10-CM | POA: Diagnosis not present

## 2019-02-09 DIAGNOSIS — Z7189 Other specified counseling: Secondary | ICD-10-CM | POA: Diagnosis not present

## 2019-02-09 NOTE — Patient Instructions (Signed)
Leucovorin injection What is this medicine? LEUCOVORIN (loo koe VOR in) is used to prevent or treat the harmful effects of some medicines. This medicine is used to treat anemia caused by a low amount of folic acid in the body. It is also used with 5-fluorouracil (5-FU) to treat colon cancer. This medicine may be used for other purposes; ask your health care provider or pharmacist if you have questions. What should I tell my health care provider before I take this medicine? They need to know if you have any of these conditions: -anemia from low levels of vitamin B-12 in the blood -an unusual or allergic reaction to leucovorin, folic acid, other medicines, foods, dyes, or preservatives -pregnant or trying to get pregnant -breast-feeding How should I use this medicine? This medicine is for injection into a muscle or into a vein. It is given by a health care professional in a hospital or clinic setting. Talk to your pediatrician regarding the use of this medicine in children. Special care may be needed. Overdosage: If you think you have taken too much of this medicine contact a poison control center or emergency room at once. NOTE: This medicine is only for you. Do not share this medicine with others. What if I miss a dose? This does not apply. What may interact with this medicine? -capecitabine -fluorouracil -phenobarbital -phenytoin -primidone -trimethoprim-sulfamethoxazole This list may not describe all possible interactions. Give your health care provider a list of all the medicines, herbs, non-prescription drugs, or dietary supplements you use. Also tell them if you smoke, drink alcohol, or use illegal drugs. Some items may interact with your medicine. What should I watch for while using this medicine? Your condition will be monitored carefully while you are receiving this medicine. This medicine may increase the side effects of 5-fluorouracil, 5-FU. Tell your doctor or health care  professional if you have diarrhea or mouth sores that do not get better or that get worse. What side effects may I notice from receiving this medicine? Side effects that you should report to your doctor or health care professional as soon as possible: -allergic reactions like skin rash, itching or hives, swelling of the face, lips, or tongue -breathing problems -fever, infection -mouth sores -unusual bleeding or bruising -unusually weak or tired Side effects that usually do not require medical attention (report to your doctor or health care professional if they continue or are bothersome): -constipation or diarrhea -loss of appetite -nausea, vomiting This list may not describe all possible side effects. Call your doctor for medical advice about side effects. You may report side effects to FDA at 1-800-FDA-1088. Where should I keep my medicine? This drug is given in a hospital or clinic and will not be stored at home. NOTE: This sheet is a summary. It may not cover all possible information. If you have questions about this medicine, talk to your doctor, pharmacist, or health care provider.  2019 Elsevier/Gold Standard (2008-04-04 16:50:29) Irinotecan injection What is this medicine? IRINOTECAN (ir in oh TEE kan ) is a chemotherapy drug. It is used to treat colon and rectal cancer. This medicine may be used for other purposes; ask your health care provider or pharmacist if you have questions. COMMON BRAND NAME(S): Camptosar What should I tell my health care provider before I take this medicine? They need to know if you have any of these conditions: -dehydration -diarrhea -infection (especially a virus infection such as chickenpox, cold sores, or herpes) -liver disease -low blood counts, like low  white cell, platelet, or red cell counts -low levels of calcium, magnesium, or potassium in the blood -recent or ongoing radiation therapy -an unusual or allergic reaction to irinotecan, other  medicines, foods, dyes, or preservatives -pregnant or trying to get pregnant -breast-feeding How should I use this medicine? This drug is given as an infusion into a vein. It is administered in a hospital or clinic by a specially trained health care professional. Talk to your pediatrician regarding the use of this medicine in children. Special care may be needed. Overdosage: If you think you have taken too much of this medicine contact a poison control center or emergency room at once. NOTE: This medicine is only for you. Do not share this medicine with others. What if I miss a dose? It is important not to miss your dose. Call your doctor or health care professional if you are unable to keep an appointment. What may interact with this medicine? This medicine may interact with the following medications: -antiviral medicines for HIV or AIDS -certain antibiotics like rifampin or rifabutin -certain medicines for fungal infections like itraconazole, ketoconazole, posaconazole, and voriconazole -certain medicines for seizures like carbamazepine, phenobarbital, phenotoin -clarithromycin -gemfibrozil -nefazodone -St. John's Wort This list may not describe all possible interactions. Give your health care provider a list of all the medicines, herbs, non-prescription drugs, or dietary supplements you use. Also tell them if you smoke, drink alcohol, or use illegal drugs. Some items may interact with your medicine. What should I watch for while using this medicine? Your condition will be monitored carefully while you are receiving this medicine. You will need important blood work done while you are taking this medicine. This drug may make you feel generally unwell. This is not uncommon, as chemotherapy can affect healthy cells as well as cancer cells. Report any side effects. Continue your course of treatment even though you feel ill unless your doctor tells you to stop. In some cases, you may be given  additional medicines to help with side effects. Follow all directions for their use. You may get drowsy or dizzy. Do not drive, use machinery, or do anything that needs mental alertness until you know how this medicine affects you. Do not stand or sit up quickly, especially if you are an older patient. This reduces the risk of dizzy or fainting spells. Call your doctor or health care professional for advice if you get a fever, chills or sore throat, or other symptoms of a cold or flu. Do not treat yourself. This drug decreases your body's ability to fight infections. Try to avoid being around people who are sick. This medicine may increase your risk to bruise or bleed. Call your doctor or health care professional if you notice any unusual bleeding. Be careful brushing and flossing your teeth or using a toothpick because you may get an infection or bleed more easily. If you have any dental work done, tell your dentist you are receiving this medicine. Avoid taking products that contain aspirin, acetaminophen, ibuprofen, naproxen, or ketoprofen unless instructed by your doctor. These medicines may hide a fever. Do not become pregnant while taking this medicine. Women should inform their doctor if they wish to become pregnant or think they might be pregnant. There is a potential for serious side effects to an unborn child. Talk to your health care professional or pharmacist for more information. Do not breast-feed an infant while taking this medicine. What side effects may I notice from receiving this medicine? Side effects that  you should report to your doctor or health care professional as soon as possible: -allergic reactions like skin rash, itching or hives, swelling of the face, lips, or tongue -chest pain -diarrhea -flushing, runny nose, sweating during infusion -low blood counts - this medicine may decrease the number of white blood cells, red blood cells and platelets. You may be at increased risk  for infections and bleeding. -nausea, vomiting -pain, swelling, warmth in the leg -signs of decreased platelets or bleeding - bruising, pinpoint red spots on the skin, black, tarry stools, blood in the urine -signs of infection - fever or chills, cough, sore throat, pain or difficulty passing urine -signs of decreased red blood cells - unusually weak or tired, fainting spells, lightheadedness Side effects that usually do not require medical attention (report to your doctor or health care professional if they continue or are bothersome): -constipation -hair loss -headache -loss of appetite -mouth sores -stomach pain This list may not describe all possible side effects. Call your doctor for medical advice about side effects. You may report side effects to FDA at 1-800-FDA-1088. Where should I keep my medicine? This drug is given in a hospital or clinic and will not be stored at home. NOTE: This sheet is a summary. It may not cover all possible information. If you have questions about this medicine, talk to your doctor, pharmacist, or health care provider.  2019 Elsevier/Gold Standard (2017-12-15 15:02:58) Oxaliplatin Injection What is this medicine? OXALIPLATIN (ox AL i PLA tin) is a chemotherapy drug. It targets fast dividing cells, like cancer cells, and causes these cells to die. This medicine is used to treat cancers of the colon and rectum, and many other cancers. This medicine may be used for other purposes; ask your health care provider or pharmacist if you have questions. COMMON BRAND NAME(S): Eloxatin What should I tell my health care provider before I take this medicine? They need to know if you have any of these conditions: -kidney disease -an unusual or allergic reaction to oxaliplatin, other chemotherapy, other medicines, foods, dyes, or preservatives -pregnant or trying to get pregnant -breast-feeding How should I use this medicine? This drug is given as an infusion into a  vein. It is administered in a hospital or clinic by a specially trained health care professional. Talk to your pediatrician regarding the use of this medicine in children. Special care may be needed. Overdosage: If you think you have taken too much of this medicine contact a poison control center or emergency room at once. NOTE: This medicine is only for you. Do not share this medicine with others. What if I miss a dose? It is important not to miss a dose. Call your doctor or health care professional if you are unable to keep an appointment. What may interact with this medicine? -medicines to increase blood counts like filgrastim, pegfilgrastim, sargramostim -probenecid -some antibiotics like amikacin, gentamicin, neomycin, polymyxin B, streptomycin, tobramycin -zalcitabine Talk to your doctor or health care professional before taking any of these medicines: -acetaminophen -aspirin -ibuprofen -ketoprofen -naproxen This list may not describe all possible interactions. Give your health care provider a list of all the medicines, herbs, non-prescription drugs, or dietary supplements you use. Also tell them if you smoke, drink alcohol, or use illegal drugs. Some items may interact with your medicine. What should I watch for while using this medicine? Your condition will be monitored carefully while you are receiving this medicine. You will need important blood work done while you are taking this  medicine. This medicine can make you more sensitive to cold. Do not drink cold drinks or use ice. Cover exposed skin before coming in contact with cold temperatures or cold objects. When out in cold weather wear warm clothing and cover your mouth and nose to warm the air that goes into your lungs. Tell your doctor if you get sensitive to the cold. This drug may make you feel generally unwell. This is not uncommon, as chemotherapy can affect healthy cells as well as cancer cells. Report any side effects.  Continue your course of treatment even though you feel ill unless your doctor tells you to stop. In some cases, you may be given additional medicines to help with side effects. Follow all directions for their use. Call your doctor or health care professional for advice if you get a fever, chills or sore throat, or other symptoms of a cold or flu. Do not treat yourself. This drug decreases your body's ability to fight infections. Try to avoid being around people who are sick. This medicine may increase your risk to bruise or bleed. Call your doctor or health care professional if you notice any unusual bleeding. Be careful brushing and flossing your teeth or using a toothpick because you may get an infection or bleed more easily. If you have any dental work done, tell your dentist you are receiving this medicine. Avoid taking products that contain aspirin, acetaminophen, ibuprofen, naproxen, or ketoprofen unless instructed by your doctor. These medicines may hide a fever. Do not become pregnant while taking this medicine. Women should inform their doctor if they wish to become pregnant or think they might be pregnant. There is a potential for serious side effects to an unborn child. Talk to your health care professional or pharmacist for more information. Do not breast-feed an infant while taking this medicine. Call your doctor or health care professional if you get diarrhea. Do not treat yourself. What side effects may I notice from receiving this medicine? Side effects that you should report to your doctor or health care professional as soon as possible: -allergic reactions like skin rash, itching or hives, swelling of the face, lips, or tongue -low blood counts - This drug may decrease the number of white blood cells, red blood cells and platelets. You may be at increased risk for infections and bleeding. -signs of infection - fever or chills, cough, sore throat, pain or difficulty passing urine -signs  of decreased platelets or bleeding - bruising, pinpoint red spots on the skin, black, tarry stools, nosebleeds -signs of decreased red blood cells - unusually weak or tired, fainting spells, lightheadedness -breathing problems -chest pain, pressure -cough -diarrhea -jaw tightness -mouth sores -nausea and vomiting -pain, swelling, redness or irritation at the injection site -pain, tingling, numbness in the hands or feet -problems with balance, talking, walking -redness, blistering, peeling or loosening of the skin, including inside the mouth -trouble passing urine or change in the amount of urine Side effects that usually do not require medical attention (report to your doctor or health care professional if they continue or are bothersome): -changes in vision -constipation -hair loss -loss of appetite -metallic taste in the mouth or changes in taste -stomach pain This list may not describe all possible side effects. Call your doctor for medical advice about side effects. You may report side effects to FDA at 1-800-FDA-1088. Where should I keep my medicine? This drug is given in a hospital or clinic and will not be stored at home. NOTE:  This sheet is a summary. It may not cover all possible information. If you have questions about this medicine, talk to your doctor, pharmacist, or health care provider.  2019 Elsevier/Gold Standard (2008-04-25 17:22:47) Fluorouracil, 5-FU injection What is this medicine? FLUOROURACIL, 5-FU (flure oh YOOR a sil) is a chemotherapy drug. It slows the growth of cancer cells. This medicine is used to treat many types of cancer like breast cancer, colon or rectal cancer, pancreatic cancer, and stomach cancer. This medicine may be used for other purposes; ask your health care provider or pharmacist if you have questions. COMMON BRAND NAME(S): Adrucil What should I tell my health care provider before I take this medicine? They need to know if you have any of  these conditions: -blood disorders -dihydropyrimidine dehydrogenase (DPD) deficiency -infection (especially a virus infection such as chickenpox, cold sores, or herpes) -kidney disease -liver disease -malnourished, poor nutrition -recent or ongoing radiation therapy -an unusual or allergic reaction to fluorouracil, other chemotherapy, other medicines, foods, dyes, or preservatives -pregnant or trying to get pregnant -breast-feeding How should I use this medicine? This drug is given as an infusion or injection into a vein. It is administered in a hospital or clinic by a specially trained health care professional. Talk to your pediatrician regarding the use of this medicine in children. Special care may be needed. Overdosage: If you think you have taken too much of this medicine contact a poison control center or emergency room at once. NOTE: This medicine is only for you. Do not share this medicine with others. What if I miss a dose? It is important not to miss your dose. Call your doctor or health care professional if you are unable to keep an appointment. What may interact with this medicine? -allopurinol -cimetidine -dapsone -digoxin -hydroxyurea -leucovorin -levamisole -medicines for seizures like ethotoin, fosphenytoin, phenytoin -medicines to increase blood counts like filgrastim, pegfilgrastim, sargramostim -medicines that treat or prevent blood clots like warfarin, enoxaparin, and dalteparin -methotrexate -metronidazole -pyrimethamine -some other chemotherapy drugs like busulfan, cisplatin, estramustine, vinblastine -trimethoprim -trimetrexate -vaccines Talk to your doctor or health care professional before taking any of these medicines: -acetaminophen -aspirin -ibuprofen -ketoprofen -naproxen This list may not describe all possible interactions. Give your health care provider a list of all the medicines, herbs, non-prescription drugs, or dietary supplements you use.  Also tell them if you smoke, drink alcohol, or use illegal drugs. Some items may interact with your medicine. What should I watch for while using this medicine? Visit your doctor for checks on your progress. This drug may make you feel generally unwell. This is not uncommon, as chemotherapy can affect healthy cells as well as cancer cells. Report any side effects. Continue your course of treatment even though you feel ill unless your doctor tells you to stop. In some cases, you may be given additional medicines to help with side effects. Follow all directions for their use. Call your doctor or health care professional for advice if you get a fever, chills or sore throat, or other symptoms of a cold or flu. Do not treat yourself. This drug decreases your body's ability to fight infections. Try to avoid being around people who are sick. This medicine may increase your risk to bruise or bleed. Call your doctor or health care professional if you notice any unusual bleeding. Be careful brushing and flossing your teeth or using a toothpick because you may get an infection or bleed more easily. If you have any dental work done, tell your  dentist you are receiving this medicine. Avoid taking products that contain aspirin, acetaminophen, ibuprofen, naproxen, or ketoprofen unless instructed by your doctor. These medicines may hide a fever. Do not become pregnant while taking this medicine. Women should inform their doctor if they wish to become pregnant or think they might be pregnant. There is a potential for serious side effects to an unborn child. Talk to your health care professional or pharmacist for more information. Do not breast-feed an infant while taking this medicine. Men should inform their doctor if they wish to father a child. This medicine may lower sperm counts. Do not treat diarrhea with over the counter products. Contact your doctor if you have diarrhea that lasts more than 2 days or if it is  severe and watery. This medicine can make you more sensitive to the sun. Keep out of the sun. If you cannot avoid being in the sun, wear protective clothing and use sunscreen. Do not use sun lamps or tanning beds/booths. What side effects may I notice from receiving this medicine? Side effects that you should report to your doctor or health care professional as soon as possible: -allergic reactions like skin rash, itching or hives, swelling of the face, lips, or tongue -low blood counts - this medicine may decrease the number of white blood cells, red blood cells and platelets. You may be at increased risk for infections and bleeding. -signs of infection - fever or chills, cough, sore throat, pain or difficulty passing urine -signs of decreased platelets or bleeding - bruising, pinpoint red spots on the skin, black, tarry stools, blood in the urine -signs of decreased red blood cells - unusually weak or tired, fainting spells, lightheadedness -breathing problems -changes in vision -chest pain -mouth sores -nausea and vomiting -pain, swelling, redness at site where injected -pain, tingling, numbness in the hands or feet -redness, swelling, or sores on hands or feet -stomach pain -unusual bleeding Side effects that usually do not require medical attention (report to your doctor or health care professional if they continue or are bothersome): -changes in finger or toe nails -diarrhea -dry or itchy skin -hair loss -headache -loss of appetite -sensitivity of eyes to the light -stomach upset -unusually teary eyes This list may not describe all possible side effects. Call your doctor for medical advice about side effects. You may report side effects to FDA at 1-800-FDA-1088. Where should I keep my medicine? This drug is given in a hospital or clinic and will not be stored at home. NOTE: This sheet is a summary. It may not cover all possible information. If you have questions about this  medicine, talk to your doctor, pharmacist, or health care provider.  2019 Elsevier/Gold Standard (2008-02-02 13:53:16)

## 2019-02-09 NOTE — Progress Notes (Signed)
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 AJCC N Category: Staged < 8th Ed. AJCC M Category: Staged < 8th Ed. AJCC 8 Stage Grouping: Staged < 8th Ed. Line of Therapy: First Engineer, maintenance (IT) Status: 1 BRCA1/2 Mutation Status: Awaiting Test Results PALB2 Mutation Status: Awaiting Test Results Intent of Therapy: Non-Curative / Palliative Intent, Discussed with Patient

## 2019-02-09 NOTE — Progress Notes (Addendum)
[place] OFFICE PROGRESS NOTE   Diagnosis: Pancreas cancer  INTERVAL HISTORY:   Kristine Rodriguez is seen in her first outpatient visit since being diagnosed with pancreas cancer.  She was seen by Dr. Benay Spice in the hospital on 02/06/2019.  She was admitted on 02/04/2019 with back pain.  CT showed ill-defined liver lesions and a large mass involving the body and tail of the pancreas.  She is taking Percocet for back pain with good relief.  Some constipation.  Last bowel movement a few days ago.  She has periodic nausea.  She has vomited a few times.  Appetite overall is poor.  She notes bilateral leg swelling.  The swelling improves with elevation.  Objective:  Vital signs in last 24 hours:  Blood pressure (!) 124/59, pulse (!) 103, temperature 98.4 F (36.9 C), temperature source Oral, resp. rate 18, height 5\' 1"  (1.549 m), weight 123 lb 1.6 oz (55.8 kg), SpO2 100 %.   Physical examination- pitting edema at the lower legs bilaterally.  Calves soft and nontender.  Lab Results:  Lab Results  Component Value Date   WBC 9.9 02/08/2019   HGB 11.5 (L) 02/08/2019   HCT 34.9 (L) 02/08/2019   MCV 91.1 02/08/2019   PLT 272 02/08/2019   NEUTROABS 6.8 02/08/2019    Imaging:  Ir US Guide Bx Asp/drain  Result Date: 02/07/2019 INDICATION: 73 year old female with CT evidence of pancreatic cancer and liver lesions concerning for metastatic disease. She presents for ultrasound-guided core biopsy to facilitate tissue diagnosis. EXAM: Ultrasound-guided core biopsy of liver lesion MEDICATIONS: None. ANESTHESIA/SEDATION: Moderate (conscious) sedation was employed during this procedure. A total of Versed 2 mg and Fentanyl 100 mcg was administered intravenously. Moderate Sedation Time: 13 minutes. The patient's level of consciousness and vital signs were monitored continuously by radiology nursing throughout the procedure under my direct supervision. FLUOROSCOPY TIME:  None COMPLICATIONS:  None immediate. PROCEDURE: Informed written consent was obtained from the patient after a thorough discussion of the procedural risks, benefits and alternatives. All questions were addressed. A timeout was performed prior to the initiation of the procedure. Ultrasound was used to interrogate the liver. The ill-defined mass in the inferior aspect of hepatic segment 6 was successfully identified. A suitable skin entry site was selected and marked. The overlying skin was sterilely prepped and draped in the standard sterile fashion. Local anesthesia was attained by infiltration with 1% lidocaine. A small dermatotomy was made. Under real-time ultrasound guidance, a 17 gauge introducer needle was advanced through the liver and positioned at the margin of the mass. Multiple 18 gauge core biopsies were then coaxially obtained using the bio Pince automated biopsy device. Biopsy specimens were placed in formalin and delivered to pathology for further analysis. As the introducer needle was removed, the biopsy tract was embolized with a Gel-Foam slurry. Post biopsy ultrasound imaging demonstrates no evidence of immediate complication. The patient tolerated the procedure well. IMPRESSION: Ultrasound-guided core biopsy of hepatic mass. Electronically Signed   By: Jacqulynn Cadet M.D.   On: 02/07/2019 15:08   Vas [place] Upper Extremity Venous Duplex  Result Date: 02/08/2019 UPPER VENOUS STUDY  Indications: Edema, and Erythema Performing Technologist: June Leap RDMS, RVT  Examination Guidelines: A complete evaluation includes B-mode imaging, spectral Doppler, color Doppler, and power Doppler as needed of all accessible portions of each vessel. Bilateral testing is considered an integral part of a complete examination. Limited examinations for reoccurring indications may be performed as noted.  Right Findings: +----------+------------+---------+-----------+----------+-------+  RIGHT       Compressible Phasicity Spontaneous Properties Summary  +----------+------------+---------+-----------+----------+-------+  IJV            Full        Yes        Yes                         +----------+------------+---------+-----------+----------+-------+  Subclavian     Full        Yes        Yes                         +----------+------------+---------+-----------+----------+-------+  Axillary       Full        Yes        Yes                         +----------+------------+---------+-----------+----------+-------+  Brachial       Full        Yes        Yes                         +----------+------------+---------+-----------+----------+-------+  Radial         Full                                               +----------+------------+---------+-----------+----------+-------+  Ulnar          Full                                               +----------+------------+---------+-----------+----------+-------+  Cephalic       Full                                               +----------+------------+---------+-----------+----------+-------+  Basilic        Full                                               +----------+------------+---------+-----------+----------+-------+  Left Findings: +----------+------------+---------+-----------+----------+-------+  LEFT       Compressible Phasicity Spontaneous Properties Summary  +----------+------------+---------+-----------+----------+-------+  Subclavian                 Yes        Yes                         +----------+------------+---------+-----------+----------+-------+  Summary:  Right: No evidence of deep vein thrombosis in the upper extremity. No evidence of superficial vein thrombosis in the upper extremity.  Left: No evidence of thrombosis in the subclavian.  *See table(s) above for measurements and observations.  Diagnosing physician: Harold Barban MD Electronically signed by Harold Barban MD on 02/08/2019 at 5:12:24 PM.    Final     Medications: I have  reviewed the patient's current medications.  Assessment/Plan: 1.  Pancreas cancer  CT renal study 02/04/2019- pancreas body/tail mass with partial celiac encasement, adjacent adenopathy, and liver metastases  Chest x-ray 02/05/2019-possible right basilar lung nodules.  Biopsy liver mass 02/07/2019- adenocarcinoma; positive for cytokeratin 7 and CDX-2; cytokeratin 20, GATA-2 and TTF-1 negative. 2.  Anorexia/weight loss secondary to #1 3.  Abdomen/back pain secondary to #1 4.  COPD 5.  Family history of pancreas cancer (mother and sister) 38.  Remote history of left-sided breast cancer 7.  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  Dr. Benay Spice reviewed the diagnosis, prognosis and treatment options with her at today's visit.  She understands that no therapy will be curative.  We discussed systemic treatment options including the FOLFIRINOX regimen and gemcitabine/Abraxane.  She agrees to a trial of FOLFIRINOX.  The first several cycles she will receive FOLFOX to assess tolerance.  We reviewed potential toxicities associated with chemotherapy including bone marrow toxicity, nausea, hair loss, allergic reaction.  We reviewed the various forms of neuropathy associated with oxaliplatin including cold sensitivity, acute laryngopharyngeal dysesthesia, peripheral neuropathy and more rare occurrences such as diplopia, ataxia, incontinence.  We discussed potential toxicities associated with 5-fluorouracil including mouth sores, diarrhea, skin rash, increased sensitivity to sun, hand-foot syndrome.  She was provided written information as well.  She will attend a chemotherapy education class.  She understands this regimen requires a Port-A-Cath.  We made a referral to interventional radiology.  Foundation 1 testing is pending.  She has a family history of pancreas cancer.  We made a referral to genetics.  She will return for follow-up and cycle 1 FOLFOX on  02/21/2019.  She will contact the office in the interim with any problems.  Patient seen with Dr. Benay Spice.  25 minutes were spent face-to-face at today's visit with the majority of that time involved in counseling/coordination of care.      Ned Card ANP/GNP-BC   02/09/2019  11:25 AM This was a shared visit with Ned Card.  The liver biopsy confirms metastatic adenocarcinoma.  The clinical presentation is consistent with metastatic pancreas cancer. We discussed the prognosis and treatment options with Kristine Rodriguez.  We submitted next generation sequencing and she will be referred for germline testing via the genetics counselor.  We discussed FOLFIRINOX and gemcitabine/Abraxane chemotherapy. She understands no therapy will be curative.  I recommend FOLFIRINOX.  We will begin with FOLFOX and add irinotecan as tolerated.  We reviewed potential toxicities associated with the FOLFIRINOX regimen. Kristine Rodriguez agrees to proceed.  We will follow her clinical status, the CA 19-9, and a restaging CT as markers of clinical response.  She will be referred for Port-A-Cath placement and a chemotherapy teaching class.  Julieanne Manson, MD

## 2019-02-10 ENCOUNTER — Telehealth: Payer: Self-pay | Admitting: Nurse Practitioner

## 2019-02-10 NOTE — Telephone Encounter (Signed)
Scheduled appt per 4/29 los.  Patient did not answer the phone, left a voicemail of scheduled appt.

## 2019-02-14 ENCOUNTER — Other Ambulatory Visit: Payer: Self-pay | Admitting: Nurse Practitioner

## 2019-02-14 ENCOUNTER — Telehealth: Payer: Self-pay | Admitting: *Deleted

## 2019-02-14 DIAGNOSIS — C251 Malignant neoplasm of body of pancreas: Secondary | ICD-10-CM

## 2019-02-14 MED ORDER — OXYCODONE-ACETAMINOPHEN 5-325 MG PO TABS: 1.0000 | ORAL_TABLET | Freq: Four times a day (QID) | ORAL | 0 refills | Status: DC | PRN

## 2019-02-14 NOTE — Telephone Encounter (Signed)
Requesting refill on her mom's pain med. Is now out of medication. Also asking about her appointments as well. Patient reports she has not heard from anyone. Noted that voicemail was left for her on 02/10/19. Informed daughter of education session on 5/8 at 12:00 and OV/chemo on 5/11 at 57.

## 2019-02-15 ENCOUNTER — Telehealth: Payer: Self-pay | Admitting: *Deleted

## 2019-02-15 MED ORDER — PROCHLORPERAZINE MALEATE 10 MG PO TABS: 10.0000 mg | ORAL_TABLET | Freq: Four times a day (QID) | ORAL | 1 refills | Status: DC | PRN

## 2019-02-15 MED ORDER — LIDOCAINE-PRILOCAINE 2.5-2.5 % EX CREA: 1.0000 "application " | TOPICAL_CREAM | CUTANEOUS | 3 refills | Status: AC | PRN

## 2019-02-15 NOTE — Telephone Encounter (Signed)
Called to request chemo education call be rescheduled. Currently at same time as her port placement. High priority scheduling message sent to make the change.

## 2019-02-17 ENCOUNTER — Other Ambulatory Visit: Payer: Self-pay | Admitting: Genetic Counselor

## 2019-02-17 ENCOUNTER — Other Ambulatory Visit: Payer: Self-pay | Admitting: *Deleted

## 2019-02-17 ENCOUNTER — Telehealth: Payer: Self-pay | Admitting: *Deleted

## 2019-02-17 ENCOUNTER — Inpatient Hospital Stay: Payer: PPO | Attending: Nurse Practitioner

## 2019-02-17 ENCOUNTER — Other Ambulatory Visit: Payer: Self-pay | Admitting: Student

## 2019-02-17 DIAGNOSIS — Z5111 Encounter for antineoplastic chemotherapy: Secondary | ICD-10-CM | POA: Insufficient documentation

## 2019-02-17 DIAGNOSIS — Z800 Family history of malignant neoplasm of digestive organs: Secondary | ICD-10-CM | POA: Insufficient documentation

## 2019-02-17 DIAGNOSIS — Z853 Personal history of malignant neoplasm of breast: Secondary | ICD-10-CM | POA: Insufficient documentation

## 2019-02-17 DIAGNOSIS — C259 Malignant neoplasm of pancreas, unspecified: Secondary | ICD-10-CM | POA: Insufficient documentation

## 2019-02-17 DIAGNOSIS — C251 Malignant neoplasm of body of pancreas: Secondary | ICD-10-CM

## 2019-02-17 DIAGNOSIS — C787 Secondary malignant neoplasm of liver and intrahepatic bile duct: Secondary | ICD-10-CM | POA: Insufficient documentation

## 2019-02-17 DIAGNOSIS — E119 Type 2 diabetes mellitus without complications: Secondary | ICD-10-CM | POA: Insufficient documentation

## 2019-02-17 DIAGNOSIS — Z7984 Long term (current) use of oral hypoglycemic drugs: Secondary | ICD-10-CM | POA: Insufficient documentation

## 2019-02-17 DIAGNOSIS — Z79899 Other long term (current) drug therapy: Secondary | ICD-10-CM | POA: Insufficient documentation

## 2019-02-17 DIAGNOSIS — J449 Chronic obstructive pulmonary disease, unspecified: Secondary | ICD-10-CM | POA: Insufficient documentation

## 2019-02-17 DIAGNOSIS — F17210 Nicotine dependence, cigarettes, uncomplicated: Secondary | ICD-10-CM | POA: Insufficient documentation

## 2019-02-17 NOTE — Telephone Encounter (Signed)
Telephone teaching done on FOLFOX & FOLFIRINOX with pt's permsision.  Discussed drugs, CADD pump, EMLA, support services, etc.  Son will plan to p/u Ed packet tomorrow while pt is getting port placed.

## 2019-02-18 ENCOUNTER — Other Ambulatory Visit: Payer: PPO

## 2019-02-18 ENCOUNTER — Ambulatory Visit (HOSPITAL_COMMUNITY)
Admission: RE | Admit: 2019-02-18 | Discharge: 2019-02-18 | Disposition: A | Discharge status: Home / Self Care | Payer: PPO | Source: Ambulatory Visit | Attending: Nurse Practitioner | Admitting: Nurse Practitioner

## 2019-02-18 ENCOUNTER — Other Ambulatory Visit: Payer: Self-pay | Admitting: Nurse Practitioner

## 2019-02-18 ENCOUNTER — Other Ambulatory Visit: Payer: Self-pay

## 2019-02-18 ENCOUNTER — Encounter (HOSPITAL_COMMUNITY): Payer: Self-pay

## 2019-02-18 ENCOUNTER — Ambulatory Visit (HOSPITAL_COMMUNITY)
Admission: RE | Admit: 2019-02-18 | Discharge: 2019-02-18 | Disposition: A | Discharge status: Home / Self Care | Payer: PPO | Source: Ambulatory Visit | Attending: Oncology | Admitting: Oncology

## 2019-02-18 DIAGNOSIS — E119 Type 2 diabetes mellitus without complications: Secondary | ICD-10-CM | POA: Insufficient documentation

## 2019-02-18 DIAGNOSIS — C259 Malignant neoplasm of pancreas, unspecified: Secondary | ICD-10-CM

## 2019-02-18 DIAGNOSIS — F17210 Nicotine dependence, cigarettes, uncomplicated: Secondary | ICD-10-CM | POA: Diagnosis not present

## 2019-02-18 DIAGNOSIS — Z8249 Family history of ischemic heart disease and other diseases of the circulatory system: Secondary | ICD-10-CM | POA: Insufficient documentation

## 2019-02-18 DIAGNOSIS — Z452 Encounter for adjustment and management of vascular access device: Secondary | ICD-10-CM | POA: Diagnosis not present

## 2019-02-18 DIAGNOSIS — Z9012 Acquired absence of left breast and nipple: Secondary | ICD-10-CM | POA: Insufficient documentation

## 2019-02-18 DIAGNOSIS — Z853 Personal history of malignant neoplasm of breast: Secondary | ICD-10-CM | POA: Diagnosis not present

## 2019-02-18 DIAGNOSIS — Z808 Family history of malignant neoplasm of other organs or systems: Secondary | ICD-10-CM | POA: Diagnosis not present

## 2019-02-18 DIAGNOSIS — Z833 Family history of diabetes mellitus: Secondary | ICD-10-CM | POA: Diagnosis not present

## 2019-02-18 DIAGNOSIS — Z881 Allergy status to other antibiotic agents status: Secondary | ICD-10-CM | POA: Diagnosis not present

## 2019-02-18 DIAGNOSIS — I10 Essential (primary) hypertension: Secondary | ICD-10-CM | POA: Insufficient documentation

## 2019-02-18 DIAGNOSIS — C251 Malignant neoplasm of body of pancreas: Secondary | ICD-10-CM | POA: Diagnosis not present

## 2019-02-18 HISTORY — PX: IR IMAGING GUIDED PORT INSERTION: IMG5740

## 2019-02-18 LAB — CBC
HCT: 33.3 % — ABNORMAL LOW (ref 36.0–46.0)
Hemoglobin: 10.4 g/dL — ABNORMAL LOW (ref 12.0–15.0)
MCH: 29.3 pg (ref 26.0–34.0)
MCHC: 31.2 g/dL (ref 30.0–36.0)
MCV: 93.8 fL (ref 80.0–100.0)
Platelets: 358 10*3/uL (ref 150–400)
RBC: 3.55 MIL/uL — ABNORMAL LOW (ref 3.87–5.11)
RDW: 16.5 % — ABNORMAL HIGH (ref 11.5–15.5)
WBC: 7.8 10*3/uL (ref 4.0–10.5)
nRBC: 0 % (ref 0.0–0.2)

## 2019-02-18 LAB — GLUCOSE, CAPILLARY: Glucose-Capillary: 197 mg/dL — ABNORMAL HIGH (ref 70–99)

## 2019-02-18 LAB — PROTIME-INR
INR: 1 (ref 0.8–1.2)
Prothrombin Time: 13.3 seconds (ref 11.4–15.2)

## 2019-02-18 LAB — APTT: aPTT: 29 seconds (ref 24–36)

## 2019-02-18 MED ORDER — FENTANYL CITRATE (PF) 100 MCG/2ML IJ SOLN
INTRAMUSCULAR | Status: AC
  Filled 2019-02-18: qty 2

## 2019-02-18 MED ORDER — HEPARIN SOD (PORK) LOCK FLUSH 100 UNIT/ML IV SOLN
INTRAVENOUS | Status: AC | PRN
  Administered 2019-02-18: 500 [IU] via INTRAVENOUS

## 2019-02-18 MED ORDER — MIDAZOLAM HCL 2 MG/2ML IJ SOLN
INTRAMUSCULAR | Status: AC | PRN
  Administered 2019-02-18 (×2): 1 mg via INTRAVENOUS

## 2019-02-18 MED ORDER — VANCOMYCIN HCL IN DEXTROSE 1-5 GM/200ML-% IV SOLN
INTRAVENOUS | Status: AC
  Administered 2019-02-18: 11:00:00 1000 mg via INTRAVENOUS
  Filled 2019-02-18: qty 200

## 2019-02-18 MED ORDER — SODIUM CHLORIDE 0.9 % IV SOLN
INTRAVENOUS | Status: DC
  Administered 2019-02-18: 11:00:00 via INTRAVENOUS

## 2019-02-18 MED ORDER — MIDAZOLAM HCL 2 MG/2ML IJ SOLN
INTRAMUSCULAR | Status: AC
  Filled 2019-02-18: qty 4

## 2019-02-18 MED ORDER — LIDOCAINE-EPINEPHRINE (PF) 1 %-1:200000 IJ SOLN
INTRAMUSCULAR | Status: AC | PRN
  Administered 2019-02-18: 5 mL

## 2019-02-18 MED ORDER — LIDOCAINE-EPINEPHRINE (PF) 1 %-1:200000 IJ SOLN
INTRAMUSCULAR | Status: AC | PRN
  Administered 2019-02-18: 10 mL

## 2019-02-18 MED ORDER — LIDOCAINE-EPINEPHRINE 1 %-1:100000 IJ SOLN
INTRAMUSCULAR | Status: AC
  Filled 2019-02-18: qty 1

## 2019-02-18 MED ORDER — VANCOMYCIN HCL IN DEXTROSE 1-5 GM/200ML-% IV SOLN
1000.0000 mg | Freq: Once | INTRAVENOUS | Status: AC
  Administered 2019-02-18: 11:00:00 1000 mg via INTRAVENOUS

## 2019-02-18 MED ORDER — HEPARIN SOD (PORK) LOCK FLUSH 100 UNIT/ML IV SOLN
INTRAVENOUS | Status: AC
  Filled 2019-02-18: qty 5

## 2019-02-18 MED ORDER — FENTANYL CITRATE (PF) 100 MCG/2ML IJ SOLN
INTRAMUSCULAR | Status: AC | PRN
  Administered 2019-02-18: 50 ug via INTRAVENOUS

## 2019-02-18 NOTE — Discharge Instructions (Signed)
You may remove the dressing in 24 hours: 12:15 Saturday 02/19/2019.  After removal you may then shower.  If you wish to place a dressing or bandaid over the site you may, but it is not required.  Please note: if you were given the numbing cream to place over the site before use, please refrain from using it for 2 weeks.  The site needs time to heal and the cream may cause the surgical glue to come off prematurely.  An ice pack may be used to help numb the skin before the port is accessed for the 1st 2 weeks.  After 2 weeks you may start using the numbing cream.     Implanted Port Insertion, Care After This sheet gives you information about how to care for yourself after your procedure. Your health care provider may also give you more specific instructions. If you have problems or questions, contact your health care provider. What can I expect after the procedure? After the procedure, it is common to have:  Discomfort at the port insertion site.  Bruising on the skin over the port. This should improve over 3-4 days. Follow these instructions at home: [facility] care  After your port is placed, you will get a manufacturer's information card. The card has information about your port. Keep this card with you at all times.  Take care of the port as told by your health care provider. Ask your health care provider if you or a family member can get training for taking care of the port at home. A home health care nurse may also take care of the port.  Make sure to remember what type of port you have. Incision care      Follow instructions from your health care provider about how to take care of your port insertion site. Make sure you: ? Wash your hands with soap and water before and after you change your bandage (dressing). If soap and water are not available, use hand sanitizer. ? Change your dressing as told by your health care provider. ? Leave stitches (sutures), skin glue, or adhesive strips in place.  These skin closures may need to stay in place for 2 weeks or longer. If adhesive strip edges start to loosen and curl up, you may trim the loose edges. Do not remove adhesive strips completely unless your health care provider tells you to do that.  Check your port insertion site every day for signs of infection. Check for: ? Redness, swelling, or pain. ? Fluid or blood. ? Warmth. ? Pus or a bad smell. Activity  Return to your normal activities as told by your health care provider. Ask your health care provider what activities are safe for you.  Do not lift anything that is heavier than 10 lb (4.5 kg), or the limit that you are told, until your health care provider says that it is safe. General instructions  Take over-the-counter and prescription medicines only as told by your health care provider.  Do not take baths, swim, or use a hot tub until your health care provider approves. Ask your health care provider if you may take showers. You may only be allowed to take sponge baths.  Do not drive for 24 hours if you were given a sedative during your procedure.  Wear a medical alert bracelet in case of an emergency. This will tell any health care providers that you have a port.  Keep all follow-up visits as told by your health care provider.  This is important. Contact a health care provider if:  You cannot flush your port with saline as directed, or you cannot draw blood from the port.  You have a fever or chills.  You have redness, swelling, or pain around your port insertion site.  You have fluid or blood coming from your port insertion site.  Your port insertion site feels warm to the touch.  You have pus or a bad smell coming from the port insertion site. Get help right away if:  You have chest pain or shortness of breath.  You have bleeding from your port that you cannot control. Summary  Take care of the port as told by your health care provider. Keep the manufacturer's  information card with you at all times.  Change your dressing as told by your health care provider.  Contact a health care provider if you have a fever or chills or if you have redness, swelling, or pain around your port insertion site.  Keep all follow-up visits as told by your health care provider. This information is not intended to replace advice given to you by your health care provider. Make sure you discuss any questions you have with your health care provider. Document Released: 07/20/2013 Document Revised: 04/27/2018 Document Reviewed: 04/27/2018 Elsevier Interactive Patient Education  2019 [place] An implanted port is a device that is placed under the skin. It is usually placed in the chest. The device can be used to give IV medicine, to take blood, or for dialysis. You may have an implanted port if:  You need IV medicine that would be irritating to the small veins in your hands or arms.  You need IV medicines, such as antibiotics, for a long period of time.  You need IV nutrition for a long period of time.  You need dialysis. Having a port means that your health care provider will not need to use the veins in your arms for these procedures. You may have fewer limitations when using a port than you would if you used other types of long-term IVs, and you will likely be able to return to normal activities after your incision heals. An implanted port has two main parts:  Reservoir. The reservoir is the part where a needle is inserted to give medicines or draw blood. The reservoir is round. After it is placed, it appears as a small, raised area under your skin.  Catheter. The catheter is a thin, flexible tube that connects the reservoir to a vein. Medicine that is inserted into the reservoir goes into the catheter and then into the vein. How is my port accessed? To access your port:  A numbing cream may be placed on the skin over the port  site.  Your health care provider will put on a mask and sterile gloves.  The skin over your port will be cleaned carefully with a germ-killing soap and allowed to dry.  Your health care provider will gently pinch the port and insert a needle into it.  Your health care provider will check for a blood return to make sure the port is in the vein and is not clogged.  If your port needs to remain accessed to get medicine continuously (constant infusion), your health care provider will place a clear bandage (dressing) over the needle site. The dressing and needle will need to be changed every week, or as told by your health care provider. What is flushing? Flushing helps keep the  port from getting clogged. Follow instructions from your health care provider about how and when to flush the port. Ports are usually flushed with saline solution or a medicine called heparin. The need for flushing will depend on how the port is used:  If the port is only used from time to time to give medicines or draw blood, the port may need to be flushed: ? Before and after medicines have been given. ? Before and after blood has been drawn. ? As part of routine maintenance. Flushing may be recommended every 4-6 weeks.  If a constant infusion is running, the port may not need to be flushed.  Throw away any syringes in a disposal container that is meant for sharp items (sharps container). You can buy a sharps container from a pharmacy, or you can make one by using an empty hard plastic bottle with a cover. How long will my port stay implanted? The port can stay in for as long as your health care provider thinks it is needed. When it is time for the port to come out, a surgery will be done to remove it. The surgery will be similar to the procedure that was done to put the port in. Follow these instructions at home:   Flush your port as told by your health care provider.  If you need an infusion over several days,  follow instructions from your health care provider about how to take care of your port site. Make sure you: ? Wash your hands with soap and water before you change your dressing. If soap and water are not available, use alcohol-based hand sanitizer. ? Change your dressing as told by your health care provider. ? Place any used dressings or infusion bags into a plastic bag. Throw that bag in the trash. ? Keep the dressing that covers the needle clean and dry. Do not get it wet. ? Do not use scissors or sharp objects near the tube. ? Keep the tube clamped, unless it is being used.  Check your port site every day for signs of infection. Check for: ? Redness, swelling, or pain. ? Fluid or blood. ? Pus or a bad smell.  Protect the skin around the port site. ? Avoid wearing bra straps that rub or irritate the site. ? Protect the skin around your port from seat belts. Place a soft pad over your chest if needed.  Bathe or shower as told by your health care provider. The site may get wet as long as you are not actively receiving an infusion.  Return to your normal activities as told by your health care provider. Ask your health care provider what activities are safe for you.  Carry a medical alert card or wear a medical alert bracelet at all times. This will let health care providers know that you have an implanted port in case of an emergency. Get help right away if:  You have redness, swelling, or pain at the port site.  You have fluid or blood coming from your port site.  You have pus or a bad smell coming from the port site.  You have a fever. Summary  Implanted ports are usually placed in the chest for long-term IV access.  Follow instructions from your health care provider about flushing the port and changing bandages (dressings).  Take care of the area around your port by avoiding clothing that puts pressure on the area, and by watching for signs of infection.  Protect the skin  around your port from seat belts. Place a soft pad over your chest if needed.  Get help right away if you have a fever or you have redness, swelling, pain, drainage, or a bad smell at the port site. This information is not intended to replace advice given to you by your health care provider. Make sure you discuss any questions you have with your health care provider. Document Released: 09/29/2005 Document Revised: 11/01/2016 Document Reviewed: 11/01/2016 Elsevier Interactive Patient Education  2019 [place]. Moderate Conscious Sedation, Adult, Care After These instructions provide you with information about caring for yourself after your procedure. Your health care provider may also give you more specific instructions. Your treatment has been planned according to current medical practices, but problems sometimes occur. Call your health care provider if you have any problems or questions after your procedure. What can I expect after the procedure? After your procedure, it is common:  To feel sleepy for several hours.  To feel clumsy and have poor balance for several hours.  To have poor judgment for several hours.  To vomit if you eat too soon. Follow these instructions at home: For at least 24 hours after the procedure:   Do not: ? Participate in activities where you could fall or become injured. ? Drive. ? Use heavy machinery. ? Drink alcohol. ? Take sleeping pills or medicines that cause drowsiness. ? Make important decisions or sign legal documents. ? Take care of children on your own.  Rest. Eating and drinking  Follow the diet recommended by your health care provider.  If you vomit: ? Drink water, juice, or soup when you can drink without vomiting. ? Make sure you have little or no nausea before eating solid foods. General instructions  Have a responsible adult stay with you until you are awake and alert.  Take over-the-counter and prescription medicines only as  told by your health care provider.  If you smoke, do not smoke without supervision.  Keep all follow-up visits as told by your health care provider. This is important. Contact a health care provider if:  You keep feeling nauseous or you keep vomiting.  You feel light-headed.  You develop a rash.  You have a fever. Get help right away if:  You have trouble breathing. This information is not intended to replace advice given to you by your health care provider. Make sure you discuss any questions you have with your health care provider. Document Released: 07/20/2013 Document Revised: 03/03/2016 Document Reviewed: 01/19/2016 Elsevier Interactive Patient Education  2019 [employer].

## 2019-02-18 NOTE — H&P (Signed)
Chief Complaint: Patient was seen in consultation today for pancreatic cancer  Referring Physician(s): Arnot  Supervising Physician: Jacqulynn Cadet  Patient Status: [facility] - Out-pt  History of Present Illness: Kristine Rodriguez is a 73 y.o. female with past medical history of DM, HTN, and left breast cancer s/p treatment, is now found to have pancreatic cancer.  She has plans for upcoming chemotherapy and is need of durable venous access.    Patient presents to [facility] today in her usual state of health.  She does have some LE swelling and states she recently stopped her diuretic per MD instructions. She is otherwise without complaint. Denies fever, chills, cough, shortness of breath or abdominal pain that is uncharacteristic of her usual.  Past Medical History:  Diagnosis Date   Cancer ([place])    hx of breast cancer left   Diabetes mellitus    Hypertension    Tobacco use 02/04/2019    Past Surgical History:  Procedure Laterality Date   bladder tack     COLONOSCOPY  06/04/2012   Procedure: COLONOSCOPY;  Surgeon: Jamesetta So, MD;  Location: [facility];  Service: Gastroenterology;  Laterality: N/A;   ESOPHAGOGASTRODUODENOSCOPY  06/04/2012   Procedure: ESOPHAGOGASTRODUODENOSCOPY (EGD);  Surgeon: Jamesetta So, MD;  Location: [facility];  Service: Gastroenterology;  Laterality: N/A;   HERNIA REPAIR     IR US GUIDE BX ASP/DRAIN  02/07/2019   left mastectomy     TONSILLECTOMY     VESICO-VAGINAL FISTULA REPAIR      Allergies: Duricef [cefadroxil] and Tape  Medications: Prior to Admission medications   Medication Sig Start Date End Date Taking? Authorizing Provider  albuterol (VENTOLIN HFA) 108 (90 Base) MCG/ACT inhaler Inhale 2 puffs into the lungs every 4 (four) hours as needed for wheezing or shortness of breath.   Yes [provider]  glimepiride (AMARYL) 1 MG tablet Take 1 tablet (1 mg total) by mouth daily as needed. Take only if blood  sugars consistently above 140's 02/08/19 02/08/20 Yes Rai, Ripudeep K, MD  lidocaine (LIDODERM) 5 % Place 1 patch onto the skin daily. Remove & Discard patch within 12 hours or as directed by MD 02/08/19  Yes Rai, Ripudeep K, MD  Multiple Vitamin (MULTIVITAMIN WITH MINERALS) TABS tablet Take 1 tablet by mouth daily. 02/09/19  Yes Rai, Ripudeep K, MD  oxyCODONE-acetaminophen (PERCOCET/ROXICET) 5-325 MG tablet Take 1-2 tablets by mouth every 6 (six) hours as needed for moderate pain or severe pain. 02/14/19  Yes Owens Shark, NP  prochlorperazine (COMPAZINE) 10 MG tablet Take 1 tablet (10 mg total) by mouth every 6 (six) hours as needed for nausea or vomiting. 02/15/19  Yes Ladell Pier, MD  B-D ULTRAFINE III SHORT PEN 31G X 8 MM MISC USE ONCE AT BEDTIME 12/06/18   Cassandria Anger, MD  glucose blood (ONE TOUCH ULTRA TEST) test strip TEST TWICE DAILY 07/06/18   Cassandria Anger, MD  lidocaine-prilocaine (EMLA) cream Apply 1 application topically as needed. 1 hour prior to stick and cover with plastic wrap 02/15/19   Ladell Pier, MD  ondansetron (ZOFRAN ODT) 4 MG disintegrating tablet Take 1 tablet (4 mg total) by mouth every 8 (eight) hours as needed for nausea or vomiting. 02/08/19   Mendel Corning, MD     Family History  Problem Relation Age of Onset   Hypertension Mother    Pancreatic cancer Mother 70   Diabetes Father    Hypertension Father  Liver cancer Father    Pancreatic cancer Sister 61    Social History   Socioeconomic History   Marital status: Divorced    Spouse name: Not on file   Number of children: Not on file   Years of education: Not on file   Highest education level: Not on file  Occupational History   Not on file  Social Needs   Financial resource strain: Not on file   Food insecurity:    Worry: Not on file    Inability: Not on file   Transportation needs:    Medical: Not on file    Non-medical: Not on file  Tobacco Use   Smoking  status: Current Every Day Smoker    Packs/day: 0.50    Types: Cigarettes   Smokeless tobacco: Never Used  Substance and Sexual Activity   Alcohol use: Yes    Comment: socially drink   Drug use: No   Sexual activity: Yes  Lifestyle   Physical activity:    Days per week: Not on file    Minutes per session: Not on file   Stress: Not on file  Relationships   Social connections:    Talks on phone: Not on file    Gets together: Not on file    Attends religious service: Not on file    Active member of club or organization: Not on file    Attends meetings of clubs or organizations: Not on file    Relationship status: Not on file  Other Topics Concern   Not on file  Social History Narrative   Not on file     Review of Systems: A 12 point ROS discussed and pertinent positives are indicated in the HPI above.  All other systems are negative.  Review of Systems  Constitutional: Negative for fatigue and fever.  Respiratory: Negative for cough and shortness of breath.   Cardiovascular: Negative for chest pain.  Gastrointestinal: Positive for abdominal pain (chronic, cancer).  Musculoskeletal: Negative for back pain.  Psychiatric/Behavioral: Negative for behavioral problems and confusion.    Vital Signs: BP (!) 146/77    Pulse 96    Temp 98.2 F (36.8 C) (Oral)    Resp 16    SpO2 98%   Physical Exam Vitals signs and nursing note reviewed.  Constitutional:      General: She is not in acute distress.    Appearance: Normal appearance.  Neck:     Musculoskeletal: Normal range of motion and neck supple.  Cardiovascular:     Rate and Rhythm: Normal rate and regular rhythm.  Pulmonary:     Effort: Pulmonary effort is normal. No respiratory distress.     Breath sounds: Normal breath sounds.  Abdominal:     General: Abdomen is flat.     Palpations: Abdomen is soft.  Skin:    General: Skin is warm and dry.  Neurological:     General: No focal deficit present.     Mental  Status: She is alert and oriented to person, place, and time. Mental status is at baseline.  Psychiatric:        Mood and Affect: Mood normal.        Behavior: Behavior normal.        Thought Content: Thought content normal.        Judgment: Judgment normal.      MD Evaluation Airway: WNL Heart: WNL Abdomen: WNL Chest/ Lungs: WNL ASA  Classification: 3 Mallampati/Airway Score: One   Imaging:  Dg Chest 2 View  Result Date: 02/05/2019 CLINICAL DATA:  Pancreatic mass EXAM: CHEST - 2 VIEW COMPARISON:  10/24/2008 FINDINGS: There are ill-defined focal opacities at the right lung base which may be related to the ribs. Underlying lung nodules are not excluded. Lungs are hyperaerated with interstitial prominence. Normal heart size. IMPRESSION: Possible right basilar lung nodules.  CT is warranted. Electronically Signed   By: Marybelle Killings M.D.   On: 02/05/2019 14:50   Mr Thoracic Spine Wo Contrast  Result Date: 02/04/2019 CLINICAL DATA:  Acute midline thoracic back pain. Chronic bilateral lower back pain without radiculopathy. EXAM: MRI THORACIC AND LUMBAR SPINE WITHOUT CONTRAST TECHNIQUE: Multiplanar and multiecho pulse sequences of the thoracic and lumbar spine were obtained without intravenous contrast. COMPARISON:  Thoracic and lumbar spine x-rays dated December 28, 2018. FINDINGS: MRI THORACIC SPINE FINDINGS Alignment:  Physiologic. Vertebrae: No fracture, evidence of discitis, or bone lesion. Cord:  Normal signal and morphology. Paraspinal and other soft tissues: There are a few small pulmonary nodules in the right upper lobe measuring up to 5 mm. Otherwise negative. Disc levels: No significant disc bulge or herniation. No spinal canal or neuroforaminal stenosis. MRI LUMBAR SPINE FINDINGS Segmentation:  Standard. Alignment:  Physiologic. Vertebrae:  No fracture, evidence of discitis, or bone lesion. Conus medullaris and cauda equina: Conus extends to the L1-L2 level. Conus and cauda equina appear  normal. Paraspinal and other soft tissues: Negative. Disc levels: T12-L1 to L3-L4: No significant disc bulge or herniation. No spinal canal or neuroforaminal stenosis. L4-L5: Minimal disc bulging. Mild right and minimal left facet arthropathy. No stenosis. L5-S1: Negative disc. Mild bilateral facet arthropathy. No stenosis. IMPRESSION: Thoracic spine: 1. No acute osseous abnormality or significant degenerative changes. 2. There are few small pulmonary nodules in the right upper lobe measuring up to 5 mm. Recommend non-emergent CT chest without contrast for further evaluation. Lumbar spine: 1.  No acute osseous abnormality. 2. Mild lower lumbar facet arthropathy.  No stenosis or impingement. Electronically Signed   By: Titus Dubin M.D.   On: 02/04/2019 17:22   Mr Lumbar Spine Wo Contrast  Result Date: 02/04/2019 CLINICAL DATA:  Acute midline thoracic back pain. Chronic bilateral lower back pain without radiculopathy. EXAM: MRI THORACIC AND LUMBAR SPINE WITHOUT CONTRAST TECHNIQUE: Multiplanar and multiecho pulse sequences of the thoracic and lumbar spine were obtained without intravenous contrast. COMPARISON:  Thoracic and lumbar spine x-rays dated December 28, 2018. FINDINGS: MRI THORACIC SPINE FINDINGS Alignment:  Physiologic. Vertebrae: No fracture, evidence of discitis, or bone lesion. Cord:  Normal signal and morphology. Paraspinal and other soft tissues: There are a few small pulmonary nodules in the right upper lobe measuring up to 5 mm. Otherwise negative. Disc levels: No significant disc bulge or herniation. No spinal canal or neuroforaminal stenosis. MRI LUMBAR SPINE FINDINGS Segmentation:  Standard. Alignment:  Physiologic. Vertebrae:  No fracture, evidence of discitis, or bone lesion. Conus medullaris and cauda equina: Conus extends to the L1-L2 level. Conus and cauda equina appear normal. Paraspinal and other soft tissues: Negative. Disc levels: T12-L1 to L3-L4: No significant disc bulge or  herniation. No spinal canal or neuroforaminal stenosis. L4-L5: Minimal disc bulging. Mild right and minimal left facet arthropathy. No stenosis. L5-S1: Negative disc. Mild bilateral facet arthropathy. No stenosis. IMPRESSION: Thoracic spine: 1. No acute osseous abnormality or significant degenerative changes. 2. There are few small pulmonary nodules in the right upper lobe measuring up to 5 mm. Recommend non-emergent CT chest without contrast for further evaluation. Lumbar  spine: 1.  No acute osseous abnormality. 2. Mild lower lumbar facet arthropathy.  No stenosis or impingement. Electronically Signed   By: Titus Dubin M.D.   On: 02/04/2019 17:22   Ir US Guide Bx Asp/drain  Result Date: 02/07/2019 INDICATION: 73 year old female with CT evidence of pancreatic cancer and liver lesions concerning for metastatic disease. She presents for ultrasound-guided core biopsy to facilitate tissue diagnosis. EXAM: Ultrasound-guided core biopsy of liver lesion MEDICATIONS: None. ANESTHESIA/SEDATION: Moderate (conscious) sedation was employed during this procedure. A total of Versed 2 mg and Fentanyl 100 mcg was administered intravenously. Moderate Sedation Time: 13 minutes. The patient's level of consciousness and vital signs were monitored continuously by radiology nursing throughout the procedure under my direct supervision. FLUOROSCOPY TIME:  None COMPLICATIONS: None immediate. PROCEDURE: Informed written consent was obtained from the patient after a thorough discussion of the procedural risks, benefits and alternatives. All questions were addressed. A timeout was performed prior to the initiation of the procedure. Ultrasound was used to interrogate the liver. The ill-defined mass in the inferior aspect of hepatic segment 6 was successfully identified. A suitable skin entry site was selected and marked. The overlying skin was sterilely prepped and draped in the standard sterile fashion. Local anesthesia was attained by  infiltration with 1% lidocaine. A small dermatotomy was made. Under real-time ultrasound guidance, a 17 gauge introducer needle was advanced through the liver and positioned at the margin of the mass. Multiple 18 gauge core biopsies were then coaxially obtained using the bio Pince automated biopsy device. Biopsy specimens were placed in formalin and delivered to pathology for further analysis. As the introducer needle was removed, the biopsy tract was embolized with a Gel-Foam slurry. Post biopsy ultrasound imaging demonstrates no evidence of immediate complication. The patient tolerated the procedure well. IMPRESSION: Ultrasound-guided core biopsy of hepatic mass. Electronically Signed   By: Jacqulynn Cadet M.D.   On: 02/07/2019 15:08   Ct Renal Stone Study  Result Date: 02/04/2019 CLINICAL DATA:  Low back pain with nausea for 4 months. History of left breast cancer and diabetes EXAM: CT ABDOMEN AND PELVIS WITHOUT CONTRAST TECHNIQUE: Multidetector CT imaging of the abdomen and pelvis was performed following the standard protocol without IV contrast. COMPARISON:  None. FINDINGS: Lower chest: Mild emphysema and scattered scarring in both lung bases. No significant pleural or pericardial effusion. There is a small hiatal hernia. Hepatobiliary: There are several ill-defined attic lesions which are highly suspicious for metastatic disease. Largest lesion is located inferiorly in the right hepatic lobe (segment 6), measuring 2.4 x 2.1 cm on image 20/2. There is a small gallstone. No evidence of gallbladder wall thickening, surrounding inflammation or biliary dilatation. Pancreas: There is a large mass involving the pancreatic body and tail, measuring up to 6.3 x 5.2 cm on image 16/2. This partially encases the celiac trunk and is highly suspicious for pancreatic adenocarcinoma. Spleen: Normal in size without focal abnormality. Adrenals/Urinary Tract: Minimal nodularity of both adrenal glands, nonspecific. There are  several nonobstructing small right renal calculi. No evidence of ureteral calculus or hydronephrosis. Probable pelvic floor laxity. The bladder otherwise appears normal. Stomach/Bowel: No evidence of bowel wall thickening, distention or surrounding inflammatory change. Moderate diverticular changes in the distal colon. The appendix appears normal. Vascular/Lymphatic: There are several mildly enlarged lymph nodes in the upper retroperitoneum adjacent to the pancreatic mass, suspicious for metastatic disease. Largest is a left periaortic node measuring 15 mm on image 21/2. There is mild aortic and branch vessel atherosclerosis. As  above, the pancreatic mass partially encases the celiac trunk as well as the splenic vessels. Reproductive: The uterus and ovaries appear normal. No adnexal mass. Probable pelvic floor laxity. Other: No ascites or peritoneal nodular postsurgical changes in the low anterior abdominal wall. Musculoskeletal: There is deformity of the left pelvic bones which may be developmental or posttraumatic. No acute osseous findings or signs of osseous metastatic disease. IMPRESSION: 1. Large mass involving the pancreatic body and tail with probable vascular encasement, adjacent adenopathy and hepatic metastases consistent with metastatic pancreatic adenocarcinoma. Tissue sampling recommended. 2. Cholelithiasis without evidence of cholecystitis or biliary dilatation. 3. Nonobstructing right renal calculi. 4. Prominent distal colonic diverticulosis without acute inflammation. 5. No clear explanation for back pain. 6.  Aortic Atherosclerosis (ICD10-I70.0). Electronically Signed   By: Richardean Sale M.D.   On: 02/04/2019 20:18   Vas [place] Upper Extremity Venous Duplex  Result Date: 02/08/2019 UPPER VENOUS STUDY  Indications: Edema, and Erythema Performing Technologist: June Leap RDMS, RVT  Examination Guidelines: A complete evaluation includes B-mode imaging, spectral Doppler, color Doppler, and power  Doppler as needed of all accessible portions of each vessel. Bilateral testing is considered an integral part of a complete examination. Limited examinations for reoccurring indications may be performed as noted.  Right Findings: +----------+------------+---------+-----------+----------+-------+  RIGHT      Compressible Phasicity Spontaneous Properties Summary  +----------+------------+---------+-----------+----------+-------+  IJV            Full        Yes        Yes                         +----------+------------+---------+-----------+----------+-------+  Subclavian     Full        Yes        Yes                         +----------+------------+---------+-----------+----------+-------+  Axillary       Full        Yes        Yes                         +----------+------------+---------+-----------+----------+-------+  Brachial       Full        Yes        Yes                         +----------+------------+---------+-----------+----------+-------+  Radial         Full                                               +----------+------------+---------+-----------+----------+-------+  Ulnar          Full                                               +----------+------------+---------+-----------+----------+-------+  Cephalic       Full                                               +----------+------------+---------+-----------+----------+-------+  Basilic        Full                                               +----------+------------+---------+-----------+----------+-------+  Left Findings: +----------+------------+---------+-----------+----------+-------+  LEFT       Compressible Phasicity Spontaneous Properties Summary  +----------+------------+---------+-----------+----------+-------+  Subclavian                 Yes        Yes                         +----------+------------+---------+-----------+----------+-------+  Summary:  Right: No evidence of deep vein thrombosis in the upper extremity. No evidence of  superficial vein thrombosis in the upper extremity.  Left: No evidence of thrombosis in the subclavian.  *See table(s) above for measurements and observations.  Diagnosing physician: Harold Barban MD Electronically signed by Harold Barban MD on 02/08/2019 at 5:12:24 PM.    Final    [place] Ekg Site Rite  Result Date: 02/05/2019 If Site Rite image not attached, placement could not be confirmed due to current cardiac rhythm.   Labs:  CBC: Recent Labs    02/04/19 1721 02/06/19 0558 02/07/19 0551 02/08/19 0852  WBC 13.2* 8.1 8.9 9.9  HGB 14.0 10.2* 11.4* 11.5*  HCT 43.2 32.5* 36.5 34.9*  PLT 407* 268 284 272    COAGS: Recent Labs    02/04/19 2213 02/07/19 0551  INR 1.1 0.9    BMP: Recent Labs    12/22/18 1434 02/04/19 1721 02/06/19 0558 02/07/19 0551  NA 136 131* 138 141  K 4.7 4.1 3.9 4.0  CL 96 95* 115* 115*  CO2 19* 17* 17* 19*  GLUCOSE 103* 245* 75 74  BUN 22 93* 51* 39*  CALCIUM 9.8 9.6 7.7* 8.2*  CREATININE 0.96 2.59* 1.31* 1.10*  GFRNONAA 60 18* 41* 50*  GFRAA 69 21* 47* 58*    LIVER FUNCTION TESTS: Recent Labs    05/20/18 1045 09/20/18 1311 12/22/18 1434 02/04/19 1721  BILITOT 0.4 0.2 <0.2 0.5  AST 10 10 14 15   ALT 10 10 14 18   ALKPHOS 66 74 94 84  PROT 7.2 7.1 6.6 7.8  ALBUMIN 4.4 4.3 4.1 4.1    TUMOR MARKERS: No results for input(s): AFPTM, CEA, CA199, CHROMGRNA in the last 8760 hours.  Assessment and Plan: Patient with past medical history of pancreatic cancer presents with plans for upcoming chemotherapy. She is in need of durable venous access.  Request is made for Port-A-Cath placement. Patient presents today in their usual state of health.  She has been NPO and is not currently on blood thinners.   Risks and benefits of image guided port-a-catheter placement was discussed with the patient including, but not limited to bleeding, infection, pneumothorax, or fibrin sheath development and need for additional procedures.  All of the patient's  questions were answered, patient is agreeable to proceed. Consent signed and in chart.  Thank you for this interesting consult.  I greatly enjoyed meeting Kristine Rodriguez and look forward to participating in their care.  A copy of this report was sent to the requesting provider on this date.  Electronically Signed: Docia Barrier, PA 02/18/2019, 10:49 AM   I spent a total of  30 Minutes   in face to face in clinical consultation, greater than  50% of which was counseling/coordinating care for pancreatic cancer.

## 2019-02-18 NOTE — Progress Notes (Signed)
Ride confirmed. Son,Stacy Cavendish will return to pick pt up. Spoke with him via phone

## 2019-02-18 NOTE — Procedures (Signed)
Interventional Radiology Procedure Note  Procedure: Placement of a right IJ approach single lumen PowerPort.  Tip is positioned at the superior cavoatrial junction and catheter is ready for immediate use.  Complications: No immediate Recommendations:  - Ok to shower tomorrow - Do not submerge for 7 days - Routine line care   Signed,  Chaunce Winkels K. Xiana Carns, MD   

## 2019-02-20 ENCOUNTER — Other Ambulatory Visit: Payer: Self-pay | Admitting: Oncology

## 2019-02-21 ENCOUNTER — Telehealth: Payer: Self-pay | Admitting: Oncology

## 2019-02-21 ENCOUNTER — Inpatient Hospital Stay: Payer: PPO

## 2019-02-21 ENCOUNTER — Inpatient Hospital Stay (HOSPITAL_BASED_OUTPATIENT_CLINIC_OR_DEPARTMENT_OTHER): Payer: PPO | Admitting: Oncology

## 2019-02-21 ENCOUNTER — Inpatient Hospital Stay: Payer: PPO | Admitting: Genetic Counselor

## 2019-02-21 ENCOUNTER — Other Ambulatory Visit: Payer: Self-pay

## 2019-02-21 DIAGNOSIS — C251 Malignant neoplasm of body of pancreas: Secondary | ICD-10-CM

## 2019-02-21 DIAGNOSIS — Z800 Family history of malignant neoplasm of digestive organs: Secondary | ICD-10-CM

## 2019-02-21 DIAGNOSIS — Z853 Personal history of malignant neoplasm of breast: Secondary | ICD-10-CM

## 2019-02-21 DIAGNOSIS — J449 Chronic obstructive pulmonary disease, unspecified: Secondary | ICD-10-CM | POA: Diagnosis not present

## 2019-02-21 DIAGNOSIS — C787 Secondary malignant neoplasm of liver and intrahepatic bile duct: Secondary | ICD-10-CM

## 2019-02-21 DIAGNOSIS — Z79899 Other long term (current) drug therapy: Secondary | ICD-10-CM | POA: Diagnosis not present

## 2019-02-21 DIAGNOSIS — E119 Type 2 diabetes mellitus without complications: Secondary | ICD-10-CM

## 2019-02-21 DIAGNOSIS — F17210 Nicotine dependence, cigarettes, uncomplicated: Secondary | ICD-10-CM | POA: Diagnosis not present

## 2019-02-21 DIAGNOSIS — Z5111 Encounter for antineoplastic chemotherapy: Secondary | ICD-10-CM | POA: Diagnosis not present

## 2019-02-21 DIAGNOSIS — Z95828 Presence of other vascular implants and grafts: Secondary | ICD-10-CM

## 2019-02-21 DIAGNOSIS — Z789 Other specified health status: Secondary | ICD-10-CM

## 2019-02-21 DIAGNOSIS — C259 Malignant neoplasm of pancreas, unspecified: Secondary | ICD-10-CM

## 2019-02-21 DIAGNOSIS — Z7984 Long term (current) use of oral hypoglycemic drugs: Secondary | ICD-10-CM | POA: Diagnosis not present

## 2019-02-21 LAB — CBC WITH DIFFERENTIAL (CANCER CENTER ONLY)
Abs Immature Granulocytes: 0.03 10*3/uL (ref 0.00–0.07)
Basophils Absolute: 0 10*3/uL (ref 0.0–0.1)
Basophils Relative: 1 %
Eosinophils Absolute: 0.2 10*3/uL (ref 0.0–0.5)
Eosinophils Relative: 2 %
HCT: 33.5 % — ABNORMAL LOW (ref 36.0–46.0)
Hemoglobin: 10.5 g/dL — ABNORMAL LOW (ref 12.0–15.0)
Immature Granulocytes: 0 %
Lymphocytes Relative: 14 %
Lymphs Abs: 1.2 10*3/uL (ref 0.7–4.0)
MCH: 29.7 pg (ref 26.0–34.0)
MCHC: 31.3 g/dL (ref 30.0–36.0)
MCV: 94.6 fL (ref 80.0–100.0)
Monocytes Absolute: 0.7 10*3/uL (ref 0.1–1.0)
Monocytes Relative: 8 %
Neutro Abs: 6.5 10*3/uL (ref 1.7–7.7)
Neutrophils Relative %: 75 %
Platelet Count: 345 10*3/uL (ref 150–400)
RBC: 3.54 MIL/uL — ABNORMAL LOW (ref 3.87–5.11)
RDW: 16.7 % — ABNORMAL HIGH (ref 11.5–15.5)
WBC Count: 8.7 10*3/uL (ref 4.0–10.5)
nRBC: 0 % (ref 0.0–0.2)

## 2019-02-21 LAB — CMP (CANCER CENTER ONLY)
ALT: 14 U/L (ref 0–44)
AST: 11 U/L — ABNORMAL LOW (ref 15–41)
Albumin: 2.6 g/dL — ABNORMAL LOW (ref 3.5–5.0)
Alkaline Phosphatase: 88 U/L (ref 38–126)
Anion gap: 9 (ref 5–15)
BUN: 13 mg/dL (ref 8–23)
CO2: 23 mmol/L (ref 22–32)
Calcium: 8.2 mg/dL — ABNORMAL LOW (ref 8.9–10.3)
Chloride: 104 mmol/L (ref 98–111)
Creatinine: 0.8 mg/dL (ref 0.44–1.00)
GFR, Est AFR Am: >60 mL/min (ref >60)
GFR, Estimated: >60 mL/min (ref >60)
Glucose, Bld: 264 mg/dL — ABNORMAL HIGH (ref 70–99)
Potassium: 3.9 mmol/L (ref 3.5–5.1)
Sodium: 136 mmol/L (ref 135–145)
Total Bilirubin: 0.2 mg/dL — ABNORMAL LOW (ref 0.3–1.2)
Total Protein: 5.7 g/dL — ABNORMAL LOW (ref 6.5–8.1)

## 2019-02-21 MED ORDER — SODIUM CHLORIDE 0.9 % IV SOLN: 10.0000 mg | Freq: Once | INTRAVENOUS | Status: DC

## 2019-02-21 MED ORDER — DEXTROSE 5 % IV SOLN
Freq: Once | INTRAVENOUS | Status: AC
  Administered 2019-02-21: 13:00:00 via INTRAVENOUS
  Filled 2019-02-21: qty 250

## 2019-02-21 MED ORDER — DEXAMETHASONE SODIUM PHOSPHATE 10 MG/ML IJ SOLN
10.0000 mg | Freq: Once | INTRAMUSCULAR | Status: AC
  Administered 2019-02-21: 13:00:00 10 mg via INTRAVENOUS

## 2019-02-21 MED ORDER — PALONOSETRON HCL INJECTION 0.25 MG/5ML
INTRAVENOUS | Status: AC
  Filled 2019-02-21: qty 5

## 2019-02-21 MED ORDER — SODIUM CHLORIDE 0.9% FLUSH
10.0000 mL | INTRAVENOUS | Status: DC | PRN
  Administered 2019-02-21: 10 mL via INTRAVENOUS
  Filled 2019-02-21: qty 10

## 2019-02-21 MED ORDER — OXYCODONE-ACETAMINOPHEN 5-325 MG PO TABS: 1.0000 | ORAL_TABLET | Freq: Four times a day (QID) | ORAL | 0 refills | Status: DC | PRN

## 2019-02-21 MED ORDER — LEUCOVORIN CALCIUM INJECTION 350 MG
400.0000 mg/m2 | Freq: Once | INTRAVENOUS | Status: AC
  Administered 2019-02-21: 14:00:00 620 mg via INTRAVENOUS
  Filled 2019-02-21: qty 31

## 2019-02-21 MED ORDER — PALONOSETRON HCL INJECTION 0.25 MG/5ML
0.2500 mg | Freq: Once | INTRAVENOUS | Status: AC
  Administered 2019-02-21: 0.25 mg via INTRAVENOUS

## 2019-02-21 MED ORDER — DEXTROSE 5 % IV SOLN
Freq: Once | INTRAVENOUS | Status: DC
  Filled 2019-02-21: qty 250

## 2019-02-21 MED ORDER — OXALIPLATIN CHEMO INJECTION 100 MG/20ML
85.0000 mg/m2 | Freq: Once | INTRAVENOUS | Status: AC
  Administered 2019-02-21: 14:00:00 130 mg via INTRAVENOUS
  Filled 2019-02-21: qty 10

## 2019-02-21 MED ORDER — DEXAMETHASONE SODIUM PHOSPHATE 10 MG/ML IJ SOLN
INTRAMUSCULAR | Status: AC
  Filled 2019-02-21: qty 1

## 2019-02-21 MED ORDER — SODIUM CHLORIDE 0.9 % IV SOLN
2400.0000 mg/m2 | INTRAVENOUS | Status: DC
  Administered 2019-02-21: 16:00:00 3700 mg via INTRAVENOUS
  Filled 2019-02-21: qty 74

## 2019-02-21 NOTE — Progress Notes (Signed)
[place] OFFICE PROGRESS NOTE   Diagnosis: Pancreas cancer  INTERVAL HISTORY:   Kristine Rodriguez returns for a scheduled visit.  She underwent Port-A-Cath placement on 02/18/2019.  She continues to have abdomen pain.  The pain is relieved with oxycodone.  She takes 1-2 Percocet tablets per day.  She has nausea. [name] is scheduled to begin chemotherapy today.  Objective:  Vital signs in last 24 hours:  Blood pressure (!) 146/86, pulse 93, temperature 98.4 F (36.9 C), temperature source Oral, resp. rate 18, height 5\' 1"  (1.549 m), weight 121 lb 6.4 oz (55.1 kg), SpO2 100 %.    Resp: Lungs clear bilaterally Cardio: Regular rate and rhythm GI: No hepatosplenomegaly, no mass, mild diffuse tenderness Vascular: No leg edema    Portacath/PICC-without erythema  Lab Results:  Lab Results  Component Value Date   WBC 8.7 02/21/2019   HGB 10.5 (L) 02/21/2019   HCT 33.5 (L) 02/21/2019   MCV 94.6 02/21/2019   PLT 345 02/21/2019   NEUTROABS 6.5 02/21/2019    CMP  Lab Results  Component Value Date   NA 141 02/07/2019   K 4.0 02/07/2019   CL 115 (H) 02/07/2019   CO2 19 (L) 02/07/2019   GLUCOSE 74 02/07/2019   BUN 39 (H) 02/07/2019   CREATININE 1.10 (H) 02/07/2019   CALCIUM 8.2 (L) 02/07/2019   PROT 7.8 02/04/2019   ALBUMIN 4.1 02/04/2019   AST 15 02/04/2019   ALT 18 02/04/2019   ALKPHOS 84 02/04/2019   BILITOT 0.5 02/04/2019   GFRNONAA 50 (L) 02/07/2019   GFRAA 58 (L) 02/07/2019    Lab Results  Component Value Date   CEA1 182.0 (H) 02/04/2019     Imaging:  Ir Imaging Guided Port Insertion  Result Date: 02/18/2019 INDICATION: 73 year old female with pancreatic adenocarcinoma in need of durable venous access for chemotherapy. EXAM: IMPLANTED PORT A CATH PLACEMENT WITH ULTRASOUND AND FLUOROSCOPIC GUIDANCE MEDICATIONS: 1 g vancomycin; The antibiotic was administered within an appropriate time interval prior to skin puncture. ANESTHESIA/SEDATION: Versed 2  mg IV; Fentanyl 100 mcg IV; Moderate Sedation Time:  17 minutes The patient was continuously monitored during the procedure by the interventional radiology nurse under my direct supervision. FLUOROSCOPY TIME:  0 minutes, 12 seconds (1 mGy) COMPLICATIONS: None immediate. PROCEDURE: The right neck and chest was prepped with chlorhexidine, and draped in the usual sterile fashion using maximum barrier technique (cap and mask, sterile gown, sterile gloves, large sterile sheet, hand hygiene and cutaneous antiseptic). Local anesthesia was attained by infiltration with 1% lidocaine with epinephrine. Ultrasound demonstrated patency of the right internal jugular vein, and this was documented with an image. Under real-time ultrasound guidance, this vein was accessed with a 21 gauge micropuncture needle and image documentation was performed. A small dermatotomy was made at the access site with an 11 scalpel. A 0.018" wire was advanced into the SVC and the access needle exchanged for a 20F micropuncture vascular sheath. The 0.018" wire was then removed and a 0.035" wire advanced into the IVC. An appropriate location for the subcutaneous reservoir was selected below the clavicle and an incision was made through the skin and underlying soft tissues. The subcutaneous tissues were then dissected using a combination of blunt and sharp surgical technique and a pocket was formed. A single lumen power injectable portacatheter was then tunneled through the subcutaneous tissues from the pocket to the dermatotomy and the port reservoir placed within the subcutaneous pocket. The venous access site was then serially  dilated and a peel away vascular sheath placed over the wire. The wire was removed and the port catheter advanced into position under fluoroscopic guidance. The catheter tip is positioned in the superior cavoatrial junction. This was documented with a spot image. The portacatheter was then tested and found to flush and aspirate  well. The port was flushed with saline followed by 100 units/mL heparinized saline. The pocket was then closed in two layers using first subdermal inverted interrupted absorbable sutures followed by a running subcuticular suture. The epidermis was then sealed with Dermabond. The dermatotomy at the venous access site was also closed with Dermabond. IMPRESSION: Successful placement of a right IJ approach Power Port with ultrasound and fluoroscopic guidance. The catheter is ready for use. Electronically Signed   By: Jacqulynn Cadet M.D.   On: 02/18/2019 14:06    Medications: I have reviewed the patient's current medications.   Assessment/Plan: 1.  Pancreas cancer  CT renal study 02/04/2019- pancreas body/tail mass with partial celiac encasement, adjacent adenopathy, and liver metastases  Chest x-ray 02/05/2019-possible right basilar lung nodules.  Biopsy liver mass 02/07/2019- adenocarcinoma; positive for cytokeratin 7 and CDX-2; cytokeratin 20, GATA-2 and TTF-1 negative.  Cycle 1 FOLFOX 02/21/2019 2.  Anorexia/weight loss secondary to #1 3.  Abdomen/back pain secondary to #1 4.  COPD 5.  Family history of pancreas cancer (mother and sister) 72.  Remote history of left-sided breast cancer 7.  Diabetes 8.  Port-A-Cath placement by interventional radiology 02/18/2019   Disposition: [name] appears stable.  She will complete cycle 1 chemotherapy today.  The plan is to begin with FOLFOX chemotherapy and add irinotecan after 1 or 2 cycles if she is tolerating the chemotherapy well.  I refilled her prescription for Percocet.  [name] lives close to the [facility] and would like to transfer her care there.  I will contact Dr. Delton Coombes and asked him to assume her oncology care.  I am available to see her in the future as needed.  I strongly recommended she follow-up with the genetics counselor given her personal and family history of pancreas cancer.  She understands this may be  important for her family and could alter her treatment options in the future.  Betsy Coder, MD  02/21/2019  12:29 PM

## 2019-02-21 NOTE — Progress Notes (Signed)
Checked with pt regarding Pt education.  She picked up her packet & states she has no questions.  Reminded to call if she does & infusion RN will answer any questions that might come up.

## 2019-02-21 NOTE — Patient Instructions (Addendum)
Your glucose today was high at 264. The steroids you received today as premed most likely will cause this to be higher. Please check your blood glucose at home twice daily and call for a reading of 400 or greater. Please work on a low concentrated sweets diet and please inform your PCP of the chemo/premed Decadron and your glucose readings today.   Oxaliplatin Injection What is this medicine? OXALIPLATIN (ox AL i PLA tin) is a chemotherapy drug. It targets fast dividing cells, like cancer cells, and causes these cells to die. This medicine is used to treat cancers of the colon and rectum, and many other cancers. This medicine may be used for other purposes; ask your health care provider or pharmacist if you have questions. COMMON BRAND NAME(S): Eloxatin What should I tell my health care provider before I take this medicine? They need to know if you have any of these conditions: -kidney disease -an unusual or allergic reaction to oxaliplatin, other chemotherapy, other medicines, foods, dyes, or preservatives -pregnant or trying to get pregnant -breast-feeding How should I use this medicine? This drug is given as an infusion into a vein. It is administered in a hospital or clinic by a specially trained health care professional. Talk to your pediatrician regarding the use of this medicine in children. Special care may be needed. Overdosage: If you think you have taken too much of this medicine contact a poison control center or emergency room at once. NOTE: This medicine is only for you. Do not share this medicine with others. What if I miss a dose? It is important not to miss a dose. Call your doctor or health care professional if you are unable to keep an appointment. What may interact with this medicine? -medicines to increase blood counts like filgrastim, pegfilgrastim, sargramostim -probenecid -some antibiotics like amikacin, gentamicin, neomycin, polymyxin B, streptomycin,  tobramycin -zalcitabine Talk to your doctor or health care professional before taking any of these medicines: -acetaminophen -aspirin -ibuprofen -ketoprofen -naproxen This list may not describe all possible interactions. Give your health care provider a list of all the medicines, herbs, non-prescription drugs, or dietary supplements you use. Also tell them if you smoke, drink alcohol, or use illegal drugs. Some items may interact with your medicine. What should I watch for while using this medicine? Your condition will be monitored carefully while you are receiving this medicine. You will need important blood work done while you are taking this medicine. This medicine can make you more sensitive to cold. Do not drink cold drinks or use ice. Cover exposed skin before coming in contact with cold temperatures or cold objects. When out in cold weather wear warm clothing and cover your mouth and nose to warm the air that goes into your lungs. Tell your doctor if you get sensitive to the cold. This drug may make you feel generally unwell. This is not uncommon, as chemotherapy can affect healthy cells as well as cancer cells. Report any side effects. Continue your course of treatment even though you feel ill unless your doctor tells you to stop. In some cases, you may be given additional medicines to help with side effects. Follow all directions for their use. Call your doctor or health care professional for advice if you get a fever, chills or sore throat, or other symptoms of a cold or flu. Do not treat yourself. This drug decreases your body's ability to fight infections. Try to avoid being around people who are sick. This medicine may increase  your risk to bruise or bleed. Call your doctor or health care professional if you notice any unusual bleeding. Be careful brushing and flossing your teeth or using a toothpick because you may get an infection or bleed more easily. If you have any dental work done,  tell your dentist you are receiving this medicine. Avoid taking products that contain aspirin, acetaminophen, ibuprofen, naproxen, or ketoprofen unless instructed by your doctor. These medicines may hide a fever. Do not become pregnant while taking this medicine. Women should inform their doctor if they wish to become pregnant or think they might be pregnant. There is a potential for serious side effects to an unborn child. Talk to your health care professional or pharmacist for more information. Do not breast-feed an infant while taking this medicine. Call your doctor or health care professional if you get diarrhea. Do not treat yourself. What side effects may I notice from receiving this medicine? Side effects that you should report to your doctor or health care professional as soon as possible: -allergic reactions like skin rash, itching or hives, swelling of the face, lips, or tongue -low blood counts - This drug may decrease the number of white blood cells, red blood cells and platelets. You may be at increased risk for infections and bleeding. -signs of infection - fever or chills, cough, sore throat, pain or difficulty passing urine -signs of decreased platelets or bleeding - bruising, pinpoint red spots on the skin, black, tarry stools, nosebleeds -signs of decreased red blood cells - unusually weak or tired, fainting spells, lightheadedness -breathing problems -chest pain, pressure -cough -diarrhea -jaw tightness -mouth sores -nausea and vomiting -pain, swelling, redness or irritation at the injection site -pain, tingling, numbness in the hands or feet -problems with balance, talking, walking -redness, blistering, peeling or loosening of the skin, including inside the mouth -trouble passing urine or change in the amount of urine Side effects that usually do not require medical attention (report to your doctor or health care professional if they continue or are bothersome): -changes  in vision -constipation -hair loss -loss of appetite -metallic taste in the mouth or changes in taste -stomach pain This list may not describe all possible side effects. Call your doctor for medical advice about side effects. You may report side effects to FDA at 1-800-FDA-1088. Where should I keep my medicine? This drug is given in a hospital or clinic and will not be stored at home. NOTE: This sheet is a summary. It may not cover all possible information. If you have questions about this medicine, talk to your doctor, pharmacist, or health care provider.  2019 Elsevier/Gold Standard (2008-04-25 17:22:47)   Leucovorin injection What is this medicine? LEUCOVORIN (loo koe VOR in) is used to prevent or treat the harmful effects of some medicines. This medicine is used to treat anemia caused by a low amount of folic acid in the body. It is also used with 5-fluorouracil (5-FU) to treat colon cancer. This medicine may be used for other purposes; ask your health care provider or pharmacist if you have questions. What should I tell my health care provider before I take this medicine? They need to know if you have any of these conditions: -anemia from low levels of vitamin B-12 in the blood -an unusual or allergic reaction to leucovorin, folic acid, other medicines, foods, dyes, or preservatives -pregnant or trying to get pregnant -breast-feeding How should I use this medicine? This medicine is for injection into a muscle or into a  vein. It is given by a health care professional in a hospital or clinic setting. Talk to your pediatrician regarding the use of this medicine in children. Special care may be needed. Overdosage: If you think you have taken too much of this medicine contact a poison control center or emergency room at once. NOTE: This medicine is only for you. Do not share this medicine with others. What if I miss a dose? This does not apply. What may interact with this  medicine? -capecitabine -fluorouracil -phenobarbital -phenytoin -primidone -trimethoprim-sulfamethoxazole This list may not describe all possible interactions. Give your health care provider a list of all the medicines, herbs, non-prescription drugs, or dietary supplements you use. Also tell them if you smoke, drink alcohol, or use illegal drugs. Some items may interact with your medicine. What should I watch for while using this medicine? Your condition will be monitored carefully while you are receiving this medicine. This medicine may increase the side effects of 5-fluorouracil, 5-FU. Tell your doctor or health care professional if you have diarrhea or mouth sores that do not get better or that get worse. What side effects may I notice from receiving this medicine? Side effects that you should report to your doctor or health care professional as soon as possible: -allergic reactions like skin rash, itching or hives, swelling of the face, lips, or tongue -breathing problems -fever, infection -mouth sores -unusual bleeding or bruising -unusually weak or tired Side effects that usually do not require medical attention (report to your doctor or health care professional if they continue or are bothersome): -constipation or diarrhea -loss of appetite -nausea, vomiting This list may not describe all possible side effects. Call your doctor for medical advice about side effects. You may report side effects to FDA at 1-800-FDA-1088. Where should I keep my medicine? This drug is given in a hospital or clinic and will not be stored at home. NOTE: This sheet is a summary. It may not cover all possible information. If you have questions about this medicine, talk to your doctor, pharmacist, or health care provider.  2019 Elsevier/Gold Standard (2008-04-04 16:50:29)   Fluorouracil, 5-FU injection What is this medicine? FLUOROURACIL, 5-FU (flure oh YOOR a sil) is a chemotherapy drug. It slows the  growth of cancer cells. This medicine is used to treat many types of cancer like breast cancer, colon or rectal cancer, pancreatic cancer, and stomach cancer. This medicine may be used for other purposes; ask your health care provider or pharmacist if you have questions. COMMON BRAND NAME(S): Adrucil What should I tell my health care provider before I take this medicine? They need to know if you have any of these conditions: -blood disorders -dihydropyrimidine dehydrogenase (DPD) deficiency -infection (especially a virus infection such as chickenpox, cold sores, or herpes) -kidney disease -liver disease -malnourished, poor nutrition -recent or ongoing radiation therapy -an unusual or allergic reaction to fluorouracil, other chemotherapy, other medicines, foods, dyes, or preservatives -pregnant or trying to get pregnant -breast-feeding How should I use this medicine? This drug is given as an infusion or injection into a vein. It is administered in a hospital or clinic by a specially trained health care professional. Talk to your pediatrician regarding the use of this medicine in children. Special care may be needed. Overdosage: If you think you have taken too much of this medicine contact a poison control center or emergency room at once. NOTE: This medicine is only for you. Do not share this medicine with others. What if I  miss a dose? It is important not to miss your dose. Call your doctor or health care professional if you are unable to keep an appointment. What may interact with this medicine? -allopurinol -cimetidine -dapsone -digoxin -hydroxyurea -leucovorin -levamisole -medicines for seizures like ethotoin, fosphenytoin, phenytoin -medicines to increase blood counts like filgrastim, pegfilgrastim, sargramostim -medicines that treat or prevent blood clots like warfarin, enoxaparin, and dalteparin -methotrexate -metronidazole -pyrimethamine -some other chemotherapy drugs like  busulfan, cisplatin, estramustine, vinblastine -trimethoprim -trimetrexate -vaccines Talk to your doctor or health care professional before taking any of these medicines: -acetaminophen -aspirin -ibuprofen -ketoprofen -naproxen This list may not describe all possible interactions. Give your health care provider a list of all the medicines, herbs, non-prescription drugs, or dietary supplements you use. Also tell them if you smoke, drink alcohol, or use illegal drugs. Some items may interact with your medicine. What should I watch for while using this medicine? Visit your doctor for checks on your progress. This drug may make you feel generally unwell. This is not uncommon, as chemotherapy can affect healthy cells as well as cancer cells. Report any side effects. Continue your course of treatment even though you feel ill unless your doctor tells you to stop. In some cases, you may be given additional medicines to help with side effects. Follow all directions for their use. Call your doctor or health care professional for advice if you get a fever, chills or sore throat, or other symptoms of a cold or flu. Do not treat yourself. This drug decreases your body's ability to fight infections. Try to avoid being around people who are sick. This medicine may increase your risk to bruise or bleed. Call your doctor or health care professional if you notice any unusual bleeding. Be careful brushing and flossing your teeth or using a toothpick because you may get an infection or bleed more easily. If you have any dental work done, tell your dentist you are receiving this medicine. Avoid taking products that contain aspirin, acetaminophen, ibuprofen, naproxen, or ketoprofen unless instructed by your doctor. These medicines may hide a fever. Do not become pregnant while taking this medicine. Women should inform their doctor if they wish to become pregnant or think they might be pregnant. There is a potential for  serious side effects to an unborn child. Talk to your health care professional or pharmacist for more information. Do not breast-feed an infant while taking this medicine. Men should inform their doctor if they wish to father a child. This medicine may lower sperm counts. Do not treat diarrhea with over the counter products. Contact your doctor if you have diarrhea that lasts more than 2 days or if it is severe and watery. This medicine can make you more sensitive to the sun. Keep out of the sun. If you cannot avoid being in the sun, wear protective clothing and use sunscreen. Do not use sun lamps or tanning beds/booths. What side effects may I notice from receiving this medicine? Side effects that you should report to your doctor or health care professional as soon as possible: -allergic reactions like skin rash, itching or hives, swelling of the face, lips, or tongue -low blood counts - this medicine may decrease the number of white blood cells, red blood cells and platelets. You may be at increased risk for infections and bleeding. -signs of infection - fever or chills, cough, sore throat, pain or difficulty passing urine -signs of decreased platelets or bleeding - bruising, pinpoint red spots on the skin,  black, tarry stools, blood in the urine -signs of decreased red blood cells - unusually weak or tired, fainting spells, lightheadedness -breathing problems -changes in vision -chest pain -mouth sores -nausea and vomiting -pain, swelling, redness at site where injected -pain, tingling, numbness in the hands or feet -redness, swelling, or sores on hands or feet -stomach pain -unusual bleeding Side effects that usually do not require medical attention (report to your doctor or health care professional if they continue or are bothersome): -changes in finger or toe nails -diarrhea -dry or itchy skin -hair loss -headache -loss of appetite -sensitivity of eyes to the light -stomach  upset -unusually teary eyes This list may not describe all possible side effects. Call your doctor for medical advice about side effects. You may report side effects to FDA at 1-800-FDA-1088. Where should I keep my medicine? This drug is given in a hospital or clinic and will not be stored at home. NOTE: This sheet is a summary. It may not cover all possible information. If you have questions about this medicine, talk to your doctor, pharmacist, or health care provider.  2019 Elsevier/Gold Standard (2008-02-02 13:53:16)

## 2019-02-21 NOTE — Telephone Encounter (Signed)
Patient to be scheduled at [facility] per 5/11 los.  Sent message to HIM.

## 2019-02-22 ENCOUNTER — Encounter (HOSPITAL_COMMUNITY): Payer: Self-pay | Admitting: Oncology

## 2019-02-23 ENCOUNTER — Inpatient Hospital Stay: Payer: PPO

## 2019-02-23 ENCOUNTER — Other Ambulatory Visit: Payer: Self-pay

## 2019-02-23 VITALS — BP 144/78 | HR 78 | Temp 98.6°F | Resp 17

## 2019-02-23 DIAGNOSIS — Z5111 Encounter for antineoplastic chemotherapy: Secondary | ICD-10-CM | POA: Diagnosis not present

## 2019-02-23 DIAGNOSIS — C251 Malignant neoplasm of body of pancreas: Secondary | ICD-10-CM

## 2019-02-23 MED ORDER — HEPARIN SOD (PORK) LOCK FLUSH 100 UNIT/ML IV SOLN
500.0000 [IU] | Freq: Once | INTRAVENOUS | Status: AC | PRN
  Administered 2019-02-23: 500 [IU]
  Filled 2019-02-23: qty 5

## 2019-02-23 MED ORDER — SODIUM CHLORIDE 0.9% FLUSH
10.0000 mL | INTRAVENOUS | Status: DC | PRN
  Administered 2019-02-23: 10 mL
  Filled 2019-02-23: qty 10

## 2019-02-24 ENCOUNTER — Other Ambulatory Visit (HOSPITAL_COMMUNITY): Payer: Self-pay

## 2019-02-24 ENCOUNTER — Encounter (HOSPITAL_COMMUNITY): Payer: Self-pay | Admitting: Genetic Counselor

## 2019-02-24 ENCOUNTER — Inpatient Hospital Stay (HOSPITAL_COMMUNITY): Payer: PPO | Attending: Genetic Counselor | Admitting: Genetic Counselor

## 2019-02-24 DIAGNOSIS — Z5111 Encounter for antineoplastic chemotherapy: Secondary | ICD-10-CM | POA: Insufficient documentation

## 2019-02-24 DIAGNOSIS — Z800 Family history of malignant neoplasm of digestive organs: Secondary | ICD-10-CM | POA: Diagnosis not present

## 2019-02-24 DIAGNOSIS — C251 Malignant neoplasm of body of pancreas: Secondary | ICD-10-CM

## 2019-02-24 DIAGNOSIS — Z803 Family history of malignant neoplasm of breast: Secondary | ICD-10-CM | POA: Insufficient documentation

## 2019-02-24 DIAGNOSIS — Z853 Personal history of malignant neoplasm of breast: Secondary | ICD-10-CM | POA: Insufficient documentation

## 2019-02-24 DIAGNOSIS — C787 Secondary malignant neoplasm of liver and intrahepatic bile duct: Secondary | ICD-10-CM | POA: Insufficient documentation

## 2019-02-24 DIAGNOSIS — C258 Malignant neoplasm of overlapping sites of pancreas: Secondary | ICD-10-CM | POA: Insufficient documentation

## 2019-02-24 NOTE — Progress Notes (Signed)
REFERRING PROVIDER: Ladell Pier, MD [address] [place], [place] [address]  PRIMARY PROVIDER:  Sharilyn Sites, MD  PRIMARY REASON FOR VISIT:  1. Cancer of pancreas, body ([place])   2. Family history of pancreatic cancer   3. Personal history of breast cancer   4. Family history of breast cancer      HISTORY OF PRESENT ILLNESS:   Kristine Rodriguez, a 73 y.o. female, was seen for a [facility] cancer genetics consultation at the request of Dr. Benay Spice due to a personal and family history of breast and pancreatic cancer.  Kristine Rodriguez presents to clinic today to discuss the possibility of a hereditary predisposition to cancer, genetic testing, and to further clarify her future cancer risks, as well as potential cancer risks for family members.   In 1993, at the age of 89, Kristine Rodriguez was diagnosed with cancer of the left breast. The treatment plan included lumpectomy and radiation.  In 2008, at the age of 34, Kristine Rodriguez was diagnosed with cancer of the left breast.  The treatment included a unilateral mastectomy.  In 2020, at the age of 78, Kristine Rodriguez was diagnosed with pancreatic cancer.      CANCER HISTORY:    Cancer of pancreas, body ([place])   02/09/2019 Initial Diagnosis    Cancer of pancreas, body ([place])    02/21/2019 -  Chemotherapy    The patient had palonosetron (ALOXI) injection 0.25 mg, 0.25 mg, Intravenous,  Once, 1 of 4 cycles Administration: 0.25 mg (02/21/2019) irinotecan (CAMPTOSAR) 280 mg in dextrose 5 % 500 mL chemo infusion, 180 mg/m2 = 280 mg, Intravenous,  Once, 0 of 3 cycles leucovorin 620 mg in dextrose 5 % 250 mL infusion, 400 mg/m2 = 620 mg, Intravenous,  Once, 1 of 4 cycles Administration: 620 mg (02/21/2019) oxaliplatin (ELOXATIN) 130 mg in dextrose 5 % 500 mL chemo infusion, 85 mg/m2 = 130 mg, Intravenous,  Once, 1 of 4 cycles Administration: 130 mg (02/21/2019) fluorouracil (ADRUCIL) 3,700 mg in sodium chloride 0.9 % 76 mL chemo infusion, 2,400 mg/m2 = 3,700 mg,  Intravenous, 1 Day/Dose, 1 of 4 cycles Administration: 3,700 mg (02/21/2019)  for chemotherapy treatment.       RISK FACTORS:  Menarche was at age 54.  First live birth at age 24.  OCP use for approximately several years.  Ovaries intact: yes.  Hysterectomy: no.  Menopausal status: postmenopausal.  HRT use: 0 years. Colonoscopy: yes; normal. Mammogram within the last year: yes. Number of breast biopsies: 2. Up to date with pelvic exams: yes. Any excessive radiation exposure in the past: no  Past Medical History:  Diagnosis Date  . Cancer ([place])    hx of breast cancer left  . Diabetes mellitus   . Family history of breast cancer   . Family history of pancreatic cancer   . Hypertension   . Tobacco use 02/04/2019    Past Surgical History:  Procedure Laterality Date  . bladder tack    . COLONOSCOPY  06/04/2012   Procedure: COLONOSCOPY;  Surgeon: Jamesetta So, MD;  Location: [facility];  Service: Gastroenterology;  Laterality: N/A;  . ESOPHAGOGASTRODUODENOSCOPY  06/04/2012   Procedure: ESOPHAGOGASTRODUODENOSCOPY (EGD);  Surgeon: Jamesetta So, MD;  Location: [facility];  Service: Gastroenterology;  Laterality: N/A;  . HERNIA REPAIR    . IR IMAGING GUIDED PORT INSERTION  02/18/2019  . IR US GUIDE BX ASP/DRAIN  02/07/2019  . left mastectomy    . TONSILLECTOMY    .  VESICO-VAGINAL FISTULA REPAIR      Social History   Socioeconomic History  . Marital status: Divorced    Spouse name: Not on file  . Number of children: Not on file  . Years of education: Not on file  . Highest education level: Not on file  Occupational History  . Not on file  Social Needs  . Financial resource strain: Not on file  . Food insecurity:    Worry: Not on file    Inability: Not on file  . Transportation needs:    Medical: Not on file    Non-medical: Not on file  Tobacco Use  . Smoking status: Current Every Day Smoker    Packs/day: 0.50    Types: Cigarettes  . Smokeless tobacco:  Never Used  Substance and Sexual Activity  . Alcohol use: Yes    Comment: socially drink  . Drug use: No  . Sexual activity: Yes  Lifestyle  . Physical activity:    Days per week: Not on file    Minutes per session: Not on file  . Stress: Not on file  Relationships  . Social connections:    Talks on phone: Not on file    Gets together: Not on file    Attends religious service: Not on file    Active member of club or organization: Not on file    Attends meetings of clubs or organizations: Not on file    Relationship status: Not on file  Other Topics Concern  . Not on file  Social History Narrative  . Not on file     FAMILY HISTORY:  We obtained a detailed, 4-generation family history.  Significant diagnoses are listed below: Family History  Problem Relation Age of Onset  . Hypertension Mother   . Pancreatic cancer Mother 65  . Diabetes Father   . Hypertension Father   . Liver cancer Father   . Pancreatitis Sister   . Breast cancer Sister 63  . Aortic dissection Sister   . Aortic dissection Brother   . Liver cancer Paternal Grandfather     The patient has twin sons and a daughter who are cancer free.  She ha two brothers and a sister.  One brother died of an aortic dissection, the sister died of an aortic dissection, but had also been diagnosed with breast cancer.  Both siblings were shorter (sister was 69'11").  One brother is living.  The parents are deceased.  The patient's mother had pancreatic cancer and died at 37. She had three brothers and a sister who were cancer free.  The grandparents reportedly did not died of cancer.  The patient's father died of liver cancer.  He had three full sisters, and a paternal half brother and sister.  All are reportedly cancer free.  The paternal grandfather reportedly had liver cancer.  Kristine Rodriguez is unaware of previous family history of genetic testing for hereditary cancer risks. Patient's maternal ancestors are of Caucasian  descent, and paternal ancestors are of Caucasian descent. There is no reported Ashkenazi Jewish ancestry. There is no known consanguinity.  GENETIC COUNSELING ASSESSMENT: Kristine Rodriguez is a 73 y.o. female with a personal and family history of cancer which is somewhat suggestive of a hereditary cancer syndrome and predisposition to cancer. We, therefore, discussed and recommended the following at today's visit.   DISCUSSION: We discussed that 5 - 10% of breast, as well as pancreatic cancer, is hereditary, with most cases associated with BRCA mutations.  There are  other genes that can be associated with hereditary breat and pancreatic cancer cancer syndromes.  These include PALB2 and ATM.  We discussed that testing is beneficial for several reasons including knowing how to follow individuals after completing their treatment, identifying whether potential treatment options such as PARP inhibitors would be beneficial, and understand if other family members could be at risk for cancer and allow them to undergo genetic testing.   We reviewed the characteristics, features and inheritance patterns of hereditary cancer syndromes. We also discussed genetic testing, including the appropriate family members to test, the process of testing, insurance coverage and turn-around-time for results. We discussed the implications of a negative, positive and/or variant of uncertain significant result. We recommended Kristine Rodriguez pursue genetic testing for the multi cancer, pancreatitis, pancreatic cancer gene panel.   Based on Kristine Rodriguez's personal and family history of cancer, she meets medical criteria for genetic testing. Despite that she meets criteria, she may still have an out of pocket cost. We discussed that if her out of pocket cost for testing is over $100, the laboratory will call and confirm whether she wants to proceed with testing.  If the out of pocket cost of testing is less than $100 she will be billed by the genetic  testing laboratory.   In order to estimate her chance of having a BRCA mutation, we used statistical models (PENN II and BRCAPro) that consider her personal medical history, family history and ancestry.  Because each model is different, there can be a lot of variability in the risks they give.  Therefore, these numbers must be considered a rough range and not a precise risk of having a BRCA mutation.  These models estimate that she has approximately a 0.25-16% chance of having a mutation. The model, PANCPro, estimates the risk for pancreatic cancer and identifies families at increased risk so that they can be screened.  The PANCPro risk is 78.81%  Based on this assessment of her family and personal history, genetic testing is recommended.   PLAN: After considering the risks, benefits, and limitations, Kristine Rodriguez provided informed consent to pursue genetic testing and the blood sample was sent to [facility] for analysis of the Multi cancer panel with pancreatitis and preliminary pancreatic cancer genes.. Results should be available within approximately 2-3 weeks' time, at which point they will be disclosed by telephone to Kristine Rodriguez, as will any additional recommendations warranted by these results. Kristine Rodriguez will receive a summary of her genetic counseling visit and a copy of her results once available. This information will also be available in Epic.   Lastly, we encouraged Kristine Rodriguez to remain in contact with cancer genetics annually so that we can continuously update the family history and inform her of any changes in cancer genetics and testing that may be of benefit for this family.   Kristine Rodriguez questions were answered to her satisfaction today. Our contact information was provided should additional questions or concerns arise. Thank you for the referral and allowing [place] to share in the care of your patient.   Jaisen Wiltrout P. Florene Glen, [place], [facility] Certified Genetic Counselor [email or web address]  phone: [phone]  The patient was seen for a total of 45 minutes in face-to-face genetic counseling.  This patient was discussed with Drs. Magrinat, Lindi Adie and/or Burr Medico who agrees with the above.    _______________________________________________________________________ For Office Staff:  Number of people involved in session: 1 Was an Intern/ student involved with case: no

## 2019-02-25 ENCOUNTER — Other Ambulatory Visit: Payer: Self-pay

## 2019-02-25 ENCOUNTER — Encounter (HOSPITAL_COMMUNITY): Payer: Self-pay | Admitting: Hematology

## 2019-02-25 ENCOUNTER — Inpatient Hospital Stay (HOSPITAL_BASED_OUTPATIENT_CLINIC_OR_DEPARTMENT_OTHER): Payer: PPO | Admitting: Hematology

## 2019-02-25 ENCOUNTER — Ambulatory Visit (HOSPITAL_COMMUNITY)
Admission: RE | Admit: 2019-02-25 | Discharge: 2019-02-25 | Disposition: A | Discharge status: Home / Self Care | Payer: PPO | Source: Ambulatory Visit | Attending: Hematology | Admitting: Hematology

## 2019-02-25 VITALS — BP 129/69 | HR 97 | Temp 98.2°F | Resp 18 | Wt 116.4 lb

## 2019-02-25 DIAGNOSIS — Z803 Family history of malignant neoplasm of breast: Secondary | ICD-10-CM | POA: Diagnosis not present

## 2019-02-25 DIAGNOSIS — Z5111 Encounter for antineoplastic chemotherapy: Secondary | ICD-10-CM | POA: Diagnosis not present

## 2019-02-25 DIAGNOSIS — M7989 Other specified soft tissue disorders: Secondary | ICD-10-CM | POA: Diagnosis not present

## 2019-02-25 DIAGNOSIS — C787 Secondary malignant neoplasm of liver and intrahepatic bile duct: Secondary | ICD-10-CM

## 2019-02-25 DIAGNOSIS — Z800 Family history of malignant neoplasm of digestive organs: Secondary | ICD-10-CM

## 2019-02-25 DIAGNOSIS — R600 Localized edema: Secondary | ICD-10-CM | POA: Diagnosis not present

## 2019-02-25 DIAGNOSIS — C251 Malignant neoplasm of body of pancreas: Secondary | ICD-10-CM

## 2019-02-25 DIAGNOSIS — C258 Malignant neoplasm of overlapping sites of pancreas: Secondary | ICD-10-CM

## 2019-02-25 DIAGNOSIS — Z853 Personal history of malignant neoplasm of breast: Secondary | ICD-10-CM | POA: Diagnosis not present

## 2019-02-25 NOTE — Assessment & Plan Note (Signed)
1.  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 -She reports 50 pound weight loss in the last 1 year, 16 pounds since December 2019.  She lives by herself at home. - Patient was evaluated by Dr. BJulieanne Mansonand received cycle 1 of FOLFOX on 02/21/2019. -She did experience slight diarrhea with it.  She also had some cold sensitivity. - I have recommended doing a CT scan as a baseline of the chest to complete staging work-up. -She does have left leg swelling.  I have recommended doing a Doppler. -She has a follow-up appointment to see me on 03/07/2019 to initiate cycle 2.  I have told her that we will plan to add Irinotecan at 150 mg per metered square during cycle 2.  2.  Abdominal pain: -She has right upper quadrant and epigastric pain. -She is taking 4 tablets of Percocet during the daytime and 2 tablets at bedtime.  This is controlling her pain.  3.  History of breast cancer: -She had left breast cancer at age 73 status post lumpectomy, chemotherapy, and radiation therapy followed by tamoxifen. 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 - She did see geneticist KRoma Kayseron 02/24/2019.  We are awaiting results of the genetic testing.  4.  Family history: -Mother had pancreatic cancer.  Father had liver cancer. - Paternal grand father had liver cancer.  Sister had breast cancer.

## 2019-02-25 NOTE — Progress Notes (Signed)
[facility] NOTE  Patient Care Team: Sharilyn Sites, MD as PCP - General (Family Medicine)  CHIEF COMPLAINTS/PURPOSE OF CONSULTATION:  Metastatic pancreatic cancer.  HISTORY OF PRESENTING ILLNESS:  Kristine Rodriguez 73 y.o. female is seen in consultation today at the request of Dr. Julieanne Manson to take over the care of this pleasant female with newly diagnosed metastatic pancreatic cancer.  She reported approximately 50 pound weight loss in the last 1 year.  But she was not sure if the weight loss was related to her control of diabetes.  However she also noted a decrease in appetite.  She lost about 16 pounds since December last year.  She went to the ER with low back pain a few months duration.  CT scan of the kidney stone protocol showed pancreatic mass with liver metastasis.  She underwent liver biopsy which was consistent with adenocarcinoma on 02/05/2019.  She was evaluated by Dr. Julieanne Manson at [facility] and was recommended palliative chemotherapy with FOLFIRINOX.  She received cycle 1 of FOLFOX on 02/21/2019 with the intention of adding Irinotecan during cycle 2.  She reported some diarrhea associated with it which was controlled with Imodium.  She also had some cold sensitivity.  She lives by herself in [place], her daughter lives close by.  She also developed abdominal pain a few months duration mainly in the right upper quadrant and epigastric region.  She is currently taking Percocet 4 tablets during daytime and 2 tablets at nighttime. -She had a history of left breast cancer diagnosed at age 56, status post lumpectomy, chemotherapy, tamoxifen and radiation.  She had recurrent left breast cancer at age 47, underwent mastectomy.  She was not on any antiestrogen therapy during the second time.  She met with Roma Kayser our geneticist on 02/24/2019 and underwent genetic testing.  Results are pending.  She is currently drinking Ensure to maintain her nutrition. Family  history significant for mother with pancreatic cancer, father with liver cancer, paternal grandfather with liver cancer and sister with breast cancer.  MEDICAL HISTORY:  Past Medical History:  Diagnosis Date  . Cancer ([place])    hx of breast cancer left  . Diabetes mellitus   . Family history of breast cancer   . Family history of pancreatic cancer   . Hypertension   . Tobacco use 02/04/2019    SURGICAL HISTORY: Past Surgical History:  Procedure Laterality Date  . bladder tack    . COLONOSCOPY  06/04/2012   Procedure: COLONOSCOPY;  Surgeon: Jamesetta So, MD;  Location: [facility];  Service: Gastroenterology;  Laterality: N/A;  . ESOPHAGOGASTRODUODENOSCOPY  06/04/2012   Procedure: ESOPHAGOGASTRODUODENOSCOPY (EGD);  Surgeon: Jamesetta So, MD;  Location: [facility];  Service: Gastroenterology;  Laterality: N/A;  . HERNIA REPAIR    . IR IMAGING GUIDED PORT INSERTION  02/18/2019  . IR US GUIDE BX ASP/DRAIN  02/07/2019  . left mastectomy    . TONSILLECTOMY    . VESICO-VAGINAL FISTULA REPAIR      SOCIAL HISTORY: Social History   Socioeconomic History  . Marital status: Divorced    Spouse name: Not on file  . Number of children: 3  . Years of education: Not on file  . Highest education level: Not on file  Occupational History  . Occupation: retired  Scientific laboratory technician  . Financial resource strain: Not hard at all  . Food insecurity:    Worry: Never true    Inability: Never true  .  Transportation needs:    Medical: No    Non-medical: No  Tobacco Use  . Smoking status: Current Every Day Smoker    Packs/day: 0.50    Types: Cigarettes  . Smokeless tobacco: Never Used  Substance and Sexual Activity  . Alcohol use: Yes    Comment: socially drink  . Drug use: No  . Sexual activity: Yes  Lifestyle  . Physical activity:    Days per week: 0 days    Minutes per session: 0 min  . Stress: Only a little  Relationships  . Social connections:    Talks on phone: Twice a week     Gets together: Twice a week    Attends religious service: Never    Active member of club or organization: No    Attends meetings of clubs or organizations: Never    Relationship status: Not on file  . Intimate partner violence:    Fear of current or ex partner: No    Emotionally abused: No    Physically abused: No    Forced sexual activity: No  Other Topics Concern  . Not on file  Social History Narrative  . Not on file    FAMILY HISTORY: Family History  Problem Relation Age of Onset  . Hypertension Mother   . Pancreatic cancer Mother 66  . Diabetes Father   . Hypertension Father   . Liver cancer Father   . Pancreatitis Sister   . Breast cancer Sister 34  . Aortic dissection Sister   . Aortic dissection Brother   . Liver cancer Paternal Grandfather     ALLERGIES:  is allergic to duricef [cefadroxil] and tape.  MEDICATIONS:  Current Outpatient Medications  Medication Sig Dispense Refill  . albuterol (VENTOLIN HFA) 108 (90 Base) MCG/ACT inhaler Inhale 2 puffs into the lungs every 4 (four) hours as needed for wheezing or shortness of breath.    Marland Kitchen glimepiride (AMARYL) 1 MG tablet Take 1 tablet (1 mg total) by mouth daily as needed. Take only if blood sugars consistently above 140's 30 tablet 4  . glucose blood (ONE TOUCH ULTRA TEST) test strip TEST TWICE DAILY 100 each 5  . lidocaine-prilocaine (EMLA) cream Apply 1 application topically as needed. 1 hour prior to stick and cover with plastic wrap 30 g 3  . Multiple Vitamin (MULTIVITAMIN WITH MINERALS) TABS tablet Take 1 tablet by mouth daily. 90 tablet 3  . oxyCODONE-acetaminophen (PERCOCET/ROXICET) 5-325 MG tablet Take 1-2 tablets by mouth every 6 (six) hours as needed for moderate pain or severe pain. 50 tablet 0  . prochlorperazine (COMPAZINE) 10 MG tablet Take 1 tablet (10 mg total) by mouth every 6 (six) hours as needed for nausea or vomiting. 60 tablet 1  . B-D ULTRAFINE III SHORT PEN 31G X 8 MM MISC USE ONCE AT BEDTIME  (Patient not taking: Reported on 02/25/2019) 100 each 3  . lidocaine (LIDODERM) 5 % Place 1 patch onto the skin daily. Remove & Discard patch within 12 hours or as directed by MD (Patient not taking: Reported on 02/25/2019) 30 patch 1   No current facility-administered medications for this visit.     REVIEW OF SYSTEMS:   Constitutional: Denies fevers, chills or abnormal night sweats Eyes: Denies blurriness of vision, double vision or watery eyes Ears, nose, mouth, throat, and face: Denies mucositis or sore throat Respiratory: Denies cough, dyspnea or wheezes Cardiovascular: Denies palpitation, chest discomfort or lower extremity swelling Gastrointestinal:  Denies nausea, heartburn or change in bowel  habits Skin: Denies abnormal skin rashes Lymphatics: Denies new lymphadenopathy or easy bruising Neurological:Denies numbness, tingling or new weaknesses Behavioral/Psych: Mood is stable, no new changes  All other systems were reviewed with the patient and are negative.  PHYSICAL EXAMINATION: ECOG PERFORMANCE STATUS: 1 - Symptomatic but completely ambulatory  Vitals:   02/25/19 1400  BP: 129/69  Pulse: 97  Resp: 18  Temp: 98.2 F (36.8 C)  SpO2: 98%   Filed Weights   02/25/19 1400  Weight: 116 lb 6.4 oz (52.8 kg)    GENERAL:alert, no distress and comfortable SKIN: skin color, texture, turgor are normal, no rashes or significant lesions EYES: normal, conjunctiva are pink and non-injected, sclera clear OROPHARYNX:no exudate, no erythema and lips, buccal mucosa, and tongue normal  NECK: supple, thyroid normal size, non-tender, without nodularity LYMPH:  no palpable lymphadenopathy in the cervical, axillary or inguinal LUNGS: clear to auscultation and percussion with normal breathing effort HEART: regular rate & rhythm and no murmurs and no lower extremity edema ABDOMEN: Soft, mild tenderness in the right upper quadrant.  No palpable masses. Musculoskeletal:no cyanosis of digits and  no clubbing  PSYCH: alert & oriented x 3 with fluent speech NEURO: no focal motor/sensory deficits  LABORATORY DATA:  I have reviewed the data as listed Lab Results  Component Value Date   WBC 8.7 02/21/2019   HGB 10.5 (L) 02/21/2019   HCT 33.5 (L) 02/21/2019   MCV 94.6 02/21/2019   PLT 345 02/21/2019     Chemistry      Component Value Date/Time   NA 136 02/21/2019 1153   NA 136 12/22/2018 1434   K 3.9 02/21/2019 1153   CL 104 02/21/2019 1153   CO2 23 02/21/2019 1153   BUN 13 02/21/2019 1153   BUN 22 12/22/2018 1434   CREATININE 0.80 02/21/2019 1153      Component Value Date/Time   CALCIUM 8.2 (L) 02/21/2019 1153   ALKPHOS 88 02/21/2019 1153   AST 11 (L) 02/21/2019 1153   ALT 14 02/21/2019 1153   BILITOT 0.2 (L) 02/21/2019 1153       RADIOGRAPHIC STUDIES: I have personally reviewed the radiological images as listed and agreed with the findings in the report. Dg Chest 2 View  Result Date: 02/05/2019 CLINICAL DATA:  Pancreatic mass EXAM: CHEST - 2 VIEW COMPARISON:  10/24/2008 FINDINGS: There are ill-defined focal opacities at the right lung base which may be related to the ribs. Underlying lung nodules are not excluded. Lungs are hyperaerated with interstitial prominence. Normal heart size. IMPRESSION: Possible right basilar lung nodules.  CT is warranted. Electronically Signed   By: Marybelle Killings M.D.   On: 02/05/2019 14:50   Mr Thoracic Spine Wo Contrast  Result Date: 02/04/2019 CLINICAL DATA:  Acute midline thoracic back pain. Chronic bilateral lower back pain without radiculopathy. EXAM: MRI THORACIC AND LUMBAR SPINE WITHOUT CONTRAST TECHNIQUE: Multiplanar and multiecho pulse sequences of the thoracic and lumbar spine were obtained without intravenous contrast. COMPARISON:  Thoracic and lumbar spine x-rays dated December 28, 2018. FINDINGS: MRI THORACIC SPINE FINDINGS Alignment:  Physiologic. Vertebrae: No fracture, evidence of discitis, or bone lesion. Cord:  Normal signal  and morphology. Paraspinal and other soft tissues: There are a few small pulmonary nodules in the right upper lobe measuring up to 5 mm. Otherwise negative. Disc levels: No significant disc bulge or herniation. No spinal canal or neuroforaminal stenosis. MRI LUMBAR SPINE FINDINGS Segmentation:  Standard. Alignment:  Physiologic. Vertebrae:  No fracture, evidence of discitis, or  bone lesion. Conus medullaris and cauda equina: Conus extends to the L1-L2 level. Conus and cauda equina appear normal. Paraspinal and other soft tissues: Negative. Disc levels: T12-L1 to L3-L4: No significant disc bulge or herniation. No spinal canal or neuroforaminal stenosis. L4-L5: Minimal disc bulging. Mild right and minimal left facet arthropathy. No stenosis. L5-S1: Negative disc. Mild bilateral facet arthropathy. No stenosis. IMPRESSION: Thoracic spine: 1. No acute osseous abnormality or significant degenerative changes. 2. There are few small pulmonary nodules in the right upper lobe measuring up to 5 mm. Recommend non-emergent CT chest without contrast for further evaluation. Lumbar spine: 1.  No acute osseous abnormality. 2. Mild lower lumbar facet arthropathy.  No stenosis or impingement. Electronically Signed   By: Titus Dubin M.D.   On: 02/04/2019 17:22   Mr Lumbar Spine Wo Contrast  Result Date: 02/04/2019 CLINICAL DATA:  Acute midline thoracic back pain. Chronic bilateral lower back pain without radiculopathy. EXAM: MRI THORACIC AND LUMBAR SPINE WITHOUT CONTRAST TECHNIQUE: Multiplanar and multiecho pulse sequences of the thoracic and lumbar spine were obtained without intravenous contrast. COMPARISON:  Thoracic and lumbar spine x-rays dated December 28, 2018. FINDINGS: MRI THORACIC SPINE FINDINGS Alignment:  Physiologic. Vertebrae: No fracture, evidence of discitis, or bone lesion. Cord:  Normal signal and morphology. Paraspinal and other soft tissues: There are a few small pulmonary nodules in the right upper lobe  measuring up to 5 mm. Otherwise negative. Disc levels: No significant disc bulge or herniation. No spinal canal or neuroforaminal stenosis. MRI LUMBAR SPINE FINDINGS Segmentation:  Standard. Alignment:  Physiologic. Vertebrae:  No fracture, evidence of discitis, or bone lesion. Conus medullaris and cauda equina: Conus extends to the L1-L2 level. Conus and cauda equina appear normal. Paraspinal and other soft tissues: Negative. Disc levels: T12-L1 to L3-L4: No significant disc bulge or herniation. No spinal canal or neuroforaminal stenosis. L4-L5: Minimal disc bulging. Mild right and minimal left facet arthropathy. No stenosis. L5-S1: Negative disc. Mild bilateral facet arthropathy. No stenosis. IMPRESSION: Thoracic spine: 1. No acute osseous abnormality or significant degenerative changes. 2. There are few small pulmonary nodules in the right upper lobe measuring up to 5 mm. Recommend non-emergent CT chest without contrast for further evaluation. Lumbar spine: 1.  No acute osseous abnormality. 2. Mild lower lumbar facet arthropathy.  No stenosis or impingement. Electronically Signed   By: Titus Dubin M.D.   On: 02/04/2019 17:22   Ir US Guide Bx Asp/drain  Result Date: 02/07/2019 INDICATION: 73 year old female with CT evidence of pancreatic cancer and liver lesions concerning for metastatic disease. She presents for ultrasound-guided core biopsy to facilitate tissue diagnosis. EXAM: Ultrasound-guided core biopsy of liver lesion MEDICATIONS: None. ANESTHESIA/SEDATION: Moderate (conscious) sedation was employed during this procedure. A total of Versed 2 mg and Fentanyl 100 mcg was administered intravenously. Moderate Sedation Time: 13 minutes. The patient's level of consciousness and vital signs were monitored continuously by radiology nursing throughout the procedure under my direct supervision. FLUOROSCOPY TIME:  None COMPLICATIONS: None immediate. PROCEDURE: Informed written consent was obtained from the  patient after a thorough discussion of the procedural risks, benefits and alternatives. All questions were addressed. A timeout was performed prior to the initiation of the procedure. Ultrasound was used to interrogate the liver. The ill-defined mass in the inferior aspect of hepatic segment 6 was successfully identified. A suitable skin entry site was selected and marked. The overlying skin was sterilely prepped and draped in the standard sterile fashion. Local anesthesia was attained by infiltration with 1%  lidocaine. A small dermatotomy was made. Under real-time ultrasound guidance, a 17 gauge introducer needle was advanced through the liver and positioned at the margin of the mass. Multiple 18 gauge core biopsies were then coaxially obtained using the bio Pince automated biopsy device. Biopsy specimens were placed in formalin and delivered to pathology for further analysis. As the introducer needle was removed, the biopsy tract was embolized with a Gel-Foam slurry. Post biopsy ultrasound imaging demonstrates no evidence of immediate complication. The patient tolerated the procedure well. IMPRESSION: Ultrasound-guided core biopsy of hepatic mass. Electronically Signed   By: Jacqulynn Cadet M.D.   On: 02/07/2019 15:08   Ct Renal Stone Study  Result Date: 02/04/2019 CLINICAL DATA:  Low back pain with nausea for 4 months. History of left breast cancer and diabetes EXAM: CT ABDOMEN AND PELVIS WITHOUT CONTRAST TECHNIQUE: Multidetector CT imaging of the abdomen and pelvis was performed following the standard protocol without IV contrast. COMPARISON:  None. FINDINGS: Lower chest: Mild emphysema and scattered scarring in both lung bases. No significant pleural or pericardial effusion. There is a small hiatal hernia. Hepatobiliary: There are several ill-defined attic lesions which are highly suspicious for metastatic disease. Largest lesion is located inferiorly in the right hepatic lobe (segment 6), measuring 2.4 x  2.1 cm on image 20/2. There is a small gallstone. No evidence of gallbladder wall thickening, surrounding inflammation or biliary dilatation. Pancreas: There is a large mass involving the pancreatic body and tail, measuring up to 6.3 x 5.2 cm on image 16/2. This partially encases the celiac trunk and is highly suspicious for pancreatic adenocarcinoma. Spleen: Normal in size without focal abnormality. Adrenals/Urinary Tract: Minimal nodularity of both adrenal glands, nonspecific. There are several nonobstructing small right renal calculi. No evidence of ureteral calculus or hydronephrosis. Probable pelvic floor laxity. The bladder otherwise appears normal. Stomach/Bowel: No evidence of bowel wall thickening, distention or surrounding inflammatory change. Moderate diverticular changes in the distal colon. The appendix appears normal. Vascular/Lymphatic: There are several mildly enlarged lymph nodes in the upper retroperitoneum adjacent to the pancreatic mass, suspicious for metastatic disease. Largest is a left periaortic node measuring 15 mm on image 21/2. There is mild aortic and branch vessel atherosclerosis. As above, the pancreatic mass partially encases the celiac trunk as well as the splenic vessels. Reproductive: The uterus and ovaries appear normal. No adnexal mass. Probable pelvic floor laxity. Other: No ascites or peritoneal nodular postsurgical changes in the low anterior abdominal wall. Musculoskeletal: There is deformity of the left pelvic bones which may be developmental or posttraumatic. No acute osseous findings or signs of osseous metastatic disease. IMPRESSION: 1. Large mass involving the pancreatic body and tail with probable vascular encasement, adjacent adenopathy and hepatic metastases consistent with metastatic pancreatic adenocarcinoma. Tissue sampling recommended. 2. Cholelithiasis without evidence of cholecystitis or biliary dilatation. 3. Nonobstructing right renal calculi. 4. Prominent  distal colonic diverticulosis without acute inflammation. 5. No clear explanation for back pain. 6.  Aortic Atherosclerosis (ICD10-I70.0). Electronically Signed   By: Richardean Sale M.D.   On: 02/04/2019 20:18   Ir Imaging Guided Port Insertion  Result Date: 02/18/2019 INDICATION: 73 year old female with pancreatic adenocarcinoma in need of durable venous access for chemotherapy. EXAM: IMPLANTED PORT A CATH PLACEMENT WITH ULTRASOUND AND FLUOROSCOPIC GUIDANCE MEDICATIONS: 1 g vancomycin; The antibiotic was administered within an appropriate time interval prior to skin puncture. ANESTHESIA/SEDATION: Versed 2 mg IV; Fentanyl 100 mcg IV; Moderate Sedation Time:  17 minutes The patient was continuously monitored during the procedure  by the interventional radiology nurse under my direct supervision. FLUOROSCOPY TIME:  0 minutes, 12 seconds (1 mGy) COMPLICATIONS: None immediate. PROCEDURE: The right neck and chest was prepped with chlorhexidine, and draped in the usual sterile fashion using maximum barrier technique (cap and mask, sterile gown, sterile gloves, large sterile sheet, hand hygiene and cutaneous antiseptic). Local anesthesia was attained by infiltration with 1% lidocaine with epinephrine. Ultrasound demonstrated patency of the right internal jugular vein, and this was documented with an image. Under real-time ultrasound guidance, this vein was accessed with a 21 gauge micropuncture needle and image documentation was performed. A small dermatotomy was made at the access site with an 11 scalpel. A 0.018" wire was advanced into the SVC and the access needle exchanged for a 19F micropuncture vascular sheath. The 0.018" wire was then removed and a 0.035" wire advanced into the IVC. An appropriate location for the subcutaneous reservoir was selected below the clavicle and an incision was made through the skin and underlying soft tissues. The subcutaneous tissues were then dissected using a combination of blunt and  sharp surgical technique and a pocket was formed. A single lumen power injectable portacatheter was then tunneled through the subcutaneous tissues from the pocket to the dermatotomy and the port reservoir placed within the subcutaneous pocket. The venous access site was then serially dilated and a peel away vascular sheath placed over the wire. The wire was removed and the port catheter advanced into position under fluoroscopic guidance. The catheter tip is positioned in the superior cavoatrial junction. This was documented with a spot image. The portacatheter was then tested and found to flush and aspirate well. The port was flushed with saline followed by 100 units/mL heparinized saline. The pocket was then closed in two layers using first subdermal inverted interrupted absorbable sutures followed by a running subcuticular suture. The epidermis was then sealed with Dermabond. The dermatotomy at the venous access site was also closed with Dermabond. IMPRESSION: Successful placement of a right IJ approach Power Port with ultrasound and fluoroscopic guidance. The catheter is ready for use. Electronically Signed   By: Jacqulynn Cadet M.D.   On: 02/18/2019 14:06   Vas [place] Upper Extremity Venous Duplex  Result Date: 02/08/2019 UPPER VENOUS STUDY  Indications: Edema, and Erythema Performing Technologist: June Leap RDMS, RVT  Examination Guidelines: A complete evaluation includes B-mode imaging, spectral Doppler, color Doppler, and power Doppler as needed of all accessible portions of each vessel. Bilateral testing is considered an integral part of a complete examination. Limited examinations for reoccurring indications may be performed as noted.  Right Findings: +----------+------------+---------+-----------+----------+-------+ RIGHT     CompressiblePhasicitySpontaneousPropertiesSummary +----------+------------+---------+-----------+----------+-------+ IJV           Full       Yes       Yes                       +----------+------------+---------+-----------+----------+-------+ Subclavian    Full       Yes       Yes                      +----------+------------+---------+-----------+----------+-------+ Axillary      Full       Yes       Yes                      +----------+------------+---------+-----------+----------+-------+ Brachial      Full       Yes  Yes                      +----------+------------+---------+-----------+----------+-------+ Radial        Full                                          +----------+------------+---------+-----------+----------+-------+ Ulnar         Full                                          +----------+------------+---------+-----------+----------+-------+ Cephalic      Full                                          +----------+------------+---------+-----------+----------+-------+ Basilic       Full                                          +----------+------------+---------+-----------+----------+-------+  Left Findings: +----------+------------+---------+-----------+----------+-------+ LEFT      CompressiblePhasicitySpontaneousPropertiesSummary +----------+------------+---------+-----------+----------+-------+ Subclavian               Yes       Yes                      +----------+------------+---------+-----------+----------+-------+  Summary:  Right: No evidence of deep vein thrombosis in the upper extremity. No evidence of superficial vein thrombosis in the upper extremity.  Left: No evidence of thrombosis in the subclavian.  *See table(s) above for measurements and observations.  Diagnosing physician: Harold Barban MD Electronically signed by Harold Barban MD on 02/08/2019 at 5:12:24 PM.    Final    [place] Ekg Site Rite  Result Date: 02/05/2019 If Site Rite image not attached, placement could not be confirmed due to current cardiac rhythm.   ASSESSMENT & PLAN:  Cancer of pancreas, body ([place]) 1.   Metastatic pancreatic cancer to the liver and lungs: - Foundation 1 CDX shows MS-stable, TMB 3Muts/Mb, KRAS G12D, TP53 R248Q, SMAD4 D140f*18, CDKN2A W110 -CT renal study on 02/04/2019 shows large mass involving the pancreatic body and tail, adjacent adenopathy and hepatic metastasis. 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 -She reports 50 pound weight loss in the last 1 year, 16 pounds since December 2019.  She lives by herself at home. - Patient was evaluated by Dr. BJulieanne Mansonand received cycle 1 of FOLFOX on 02/21/2019. -She did experience slight diarrhea with it.  She also had some cold sensitivity. - I have recommended doing a CT scan as a baseline of the chest to complete staging work-up. -She does have left leg swelling.  I have recommended doing a Doppler. -She has a follow-up appointment to see me on 03/07/2019 to initiate cycle 2.  I have told her that we will plan to add Irinotecan at 150 mg per metered square during cycle 2.  2.  Abdominal pain: -She has right upper quadrant and epigastric pain. -She is taking 4 tablets of Percocet during the daytime and 2 tablets at bedtime.  This is controlling her pain.  3.  History of breast cancer: -She had  left breast cancer at age 58, status post lumpectomy, chemotherapy, and radiation therapy followed by tamoxifen. -She had recurrent left breast cancer at age 81, underwent mastectomy and did not receive anti-estrogen therapy. - She did see geneticist Roma Kayser on 02/24/2019.  We are awaiting results of the genetic testing.  4.  Family history: -Mother had pancreatic cancer.  Father had liver cancer. - Paternal grand father had liver cancer.  Sister had breast cancer.  Orders Placed This Encounter  Procedures  . CT Chest W Contrast    Standing Status:   Future    Standing Expiration Date:   02/25/2020    Order Specific Question:   ** REASON FOR EXAM (FREE TEXT)    Answer:   pancreatic  cancer    Order Specific Question:   If indicated for the ordered procedure, I authorize the administration of contrast media per Radiology protocol    Answer:   Yes    Order Specific Question:   Preferred imaging location?    Answer:   [facility]    Order Specific Question:   Radiology Contrast Protocol - do NOT remove file path    Answer:   [email or web address]  . US Venous Img Lower Unilateral Left    Standing Status:   Future    Number of Occurrences:   1    Standing Expiration Date:   04/26/2020    Order Specific Question:   Reason for Exam (SYMPTOM  OR DIAGNOSIS REQUIRED)    Answer:   left lower extremity swelling    Order Specific Question:   Preferred imaging location?    Answer:   [facility]    Order Specific Question:   Call Results- Best Contact Number?    Answer:   [phone]...Marland Kitchenhold patient    All questions were answered. The patient knows to call the clinic with any problems, questions or concerns.      Derek Jack, MD 02/25/2019 3:29 PM

## 2019-02-25 NOTE — Patient Instructions (Signed)
[place] at [facility] Discharge Instructions  You were seen today by Dr. Delton Coombes. He went over your recent lab and scan results. He went over your history and how you've been feeling lately.  He will send you in marinol to help with your appetite. He will get a doppler of your left ankle and foot. He will schedule a CT of your chest as well. He will see you back in  for labs, treatment and follow up.   Thank you for choosing [place] at [facility] to provide your oncology and hematology care.  To afford each patient quality time with our provider, please arrive at least 15 minutes before your scheduled appointment time.   If you have a lab appointment with the [facility] please come in thru the  Main Entrance and check in at the main information desk  You need to re-schedule your appointment should you arrive 10 or more minutes late.  We strive to give you quality time with our providers, and arriving late affects you and other patients whose appointments are after yours.  Also, if you no show three or more times for appointments you may be dismissed from the clinic at the providers discretion.     Again, thank you for choosing [facility].  Our hope is that these requests will decrease the amount of time that you wait before being seen by our physicians.       _____________________________________________________________  Should you have questions after your visit to [facility], please contact our office at [phone] between the hours of 8:00 a.m. and 4:30 p.m.  Voicemails left after 4:00 p.m. will not be returned until the following business day.  For prescription refill requests, have your pharmacy contact our office and allow 72 hours.    Cancer Center Support Programs:   > Cancer Support Group  2nd Tuesday of the month 1pm-2pm, Journey Room

## 2019-02-28 MED ORDER — DRONABINOL 2.5 MG PO CAPS: 2.5000 mg | ORAL_CAPSULE | Freq: Two times a day (BID) | ORAL | 0 refills | Status: DC

## 2019-02-28 NOTE — Addendum Note (Signed)
Addended by: Derek Jack on: 02/28/2019 09:39 AM   Modules accepted: Orders

## 2019-03-03 ENCOUNTER — Telehealth: Payer: Self-pay | Admitting: Genetic Counselor

## 2019-03-03 ENCOUNTER — Ambulatory Visit: Payer: Self-pay | Admitting: Genetic Counselor

## 2019-03-03 ENCOUNTER — Encounter: Payer: Self-pay | Admitting: Genetic Counselor

## 2019-03-03 DIAGNOSIS — Z1379 Encounter for other screening for genetic and chromosomal anomalies: Secondary | ICD-10-CM | POA: Insufficient documentation

## 2019-03-03 NOTE — Telephone Encounter (Signed)
Revealed negative genetic testing.  Discussed that we do not know why she has pancreatic cancer or why there is cancer in the family. It could be due to a different gene that we are not testing, or maybe our current technology may not be able to pick something up.  It will be important for her to keep in contact with genetics to keep up with whether additional testing may be needed.  

## 2019-03-03 NOTE — Progress Notes (Signed)
HPI:  Ms. Neighbors was previously seen in the [place] clinic due to a personal and family history of cancer and concerns regarding a hereditary predisposition to cancer. Please refer to our prior cancer genetics clinic note for more information regarding our discussion, assessment and recommendations, at the time. Ms. Wahlert recent genetic test results were disclosed to her, as were recommendations warranted by these results. These results and recommendations are discussed in more detail below.  CANCER HISTORY:    Cancer of pancreas, body ([place])   02/09/2019 Initial Diagnosis    Cancer of pancreas, body ([place])    02/21/2019 -  Chemotherapy    The patient had palonosetron (ALOXI) injection 0.25 mg, 0.25 mg, Intravenous,  Once, 1 of 4 cycles Administration: 0.25 mg (02/21/2019) irinotecan (CAMPTOSAR) 240 mg in dextrose 5 % 500 mL chemo infusion, 150 mg/m2 = 240 mg (original dose ), Intravenous,  Once, 0 of 3 cycles Dose modification: 150 mg/m2 (Cycle 2, Reason: Provider Judgment) leucovorin 620 mg in dextrose 5 % 250 mL infusion, 400 mg/m2 = 620 mg, Intravenous,  Once, 1 of 4 cycles Administration: 620 mg (02/21/2019) oxaliplatin (ELOXATIN) 130 mg in dextrose 5 % 500 mL chemo infusion, 85 mg/m2 = 130 mg, Intravenous,  Once, 1 of 4 cycles Administration: 130 mg (02/21/2019) fluorouracil (ADRUCIL) 3,700 mg in sodium chloride 0.9 % 76 mL chemo infusion, 2,400 mg/m2 = 3,700 mg, Intravenous, 1 Day/Dose, 1 of 4 cycles Administration: 3,700 mg (02/21/2019)  for chemotherapy treatment.     03/02/2019 Genetic Testing    Negative genetic testing on the common hereditary cancer panel+pancreatic cancer panel+preliminary evidence genes for pancreatic cancer+chronic pancratitis genes.  This Gene Panel offered by [facility] includes sequencing and/or deletion duplication testing of the following 55 genes: APC, ATM, AXIN2, BARD1, BMPR1A, BRCA1, BRCA2, BRIP1, CASR, CDH1, CDK4, CDKN2A (p14ARF), CDKN2A  (p16INK4a), CFTR, CHEK2, CPA1, CTNNA1, CTRC, DICER1, EPCAM (Deletion/duplication testing only), FANCC, GREM1 (promoter region deletion/duplication testing only), KIT, MEN1, MLH1, MSH2, MSH3, MSH6, MUTYH, NBN, NF1, NHTL1, PALB2, PALLD, PDGFRA, PMS2, POLD1, POLE,PRSS1, PTEN, RAD50, RAD51C, RAD51D, SDHB, SDHC, SDHD, SMAD4, SMARCA4. SPINK1, STK11, TP53, TSC1, TSC2, and VHL.  The following genes were evaluated for sequence changes only: SDHA and HOXB13 c.251G>A variant only. The report date is Mar 02, 2019.     FAMILY HISTORY:  We obtained a detailed, 4-generation family history.  Significant diagnoses are listed below: Family History  Problem Relation Age of Onset  . Hypertension Mother   . Pancreatic cancer Mother 17  . Diabetes Father   . Hypertension Father   . Liver cancer Father   . Pancreatitis Sister   . Breast cancer Sister 66  . Aortic dissection Sister   . Aortic dissection Brother   . Liver cancer Paternal Grandfather     The patient has twin sons and a daughter who are cancer free.  She ha two brothers and a sister.  One brother died of an aortic dissection, the sister died of an aortic dissection, but had also been diagnosed with breast cancer.  Both siblings were shorter (sister was 62'11").  One brother is living.  The parents are deceased.  The patient's mother had pancreatic cancer and died at 60. She had three brothers and a sister who were cancer free.  The grandparents reportedly did not died of cancer.  The patient's father died of liver cancer.  He had three full sisters, and a paternal half brother and sister.  All are reportedly cancer free.  The  paternal grandfather reportedly had liver cancer.  Ms. Moler is unaware of previous family history of genetic testing for hereditary cancer risks. Patient's maternal ancestors are of Caucasian descent, and paternal ancestors are of Caucasian descent. There is no reported Ashkenazi Jewish ancestry. There is no known  consanguinity.   GENETIC TEST RESULTS: Genetic testing reported out on Mar 02, 2019 through the common hereditary, pancreatic cancer and chronic pancreatitis cancer panel found no pathogenic mutations.  This Gene Panel offered by [facility] includes sequencing and/or deletion duplication testing of the following 55 genes: APC, ATM, AXIN2, BARD1, BMPR1A, BRCA1, BRCA2, BRIP1, CASR, CDH1, CDK4, CDKN2A (p14ARF), CDKN2A (p16INK4a), CFTR, CHEK2, CPA1, CTNNA1, CTRC, DICER1, EPCAM (Deletion/duplication testing only), FANCC, GREM1 (promoter region deletion/duplication testing only), KIT, MEN1, MLH1, MSH2, MSH3, MSH6, MUTYH, NBN, NF1, NHTL1, PALB2, PALLD, PDGFRA, PMS2, POLD1, POLE,PRSS1, PTEN, RAD50, RAD51C, RAD51D, SDHB, SDHC, SDHD, SMAD4, SMARCA4. SPINK1, STK11, TP53, TSC1, TSC2, and VHL.  The following genes were evaluated for sequence changes only: SDHA and HOXB13 c.251G>A variant only. The test report has been scanned into EPIC and is located under the Molecular Pathology section of the Results Review tab.  A portion of the result report is included below for reference.     We discussed with Ms. Royle that because current genetic testing is not perfect, it is possible there may be a gene mutation in one of these genes that current testing cannot detect, but that chance is small.  We also discussed, that there could be another gene that has not yet been discovered, or that we have not yet tested, that is responsible for the cancer diagnoses in the family. It is also possible there is a hereditary cause for the cancer in the family that Ms. Firestine did not inherit and therefore was not identified in her testing.  Therefore, it is important to remain in touch with cancer genetics in the future so that we can continue to offer Ms. Nordhoff the most up to date genetic testing.   ADDITIONAL GENETIC TESTING: We discussed with Ms. Hearld that there are other genes that are associated with increased cancer risk that can be  analyzed. Should Ms. Goelz wish to pursue additional genetic testing, we are happy to discuss and coordinate this testing, at any time.    CANCER SCREENING RECOMMENDATIONS: Ms. Needham test result is considered negative (normal).  This means that we have not identified a hereditary cause for her personal and family history of cancer at this time. Most cancers happen by chance and this negative test suggests that her cancer may fall into this category.    While reassuring, this does not definitively rule out a hereditary predisposition to cancer. It is still possible that there could be genetic mutations that are undetectable by current technology. There could be genetic mutations in genes that have not been tested or identified to increase cancer risk.  Therefore, it is recommended she continue to follow the cancer management and screening guidelines provided by her oncology and primary healthcare provider.   An individual's cancer risk and medical management are not determined by genetic test results alone. Overall cancer risk assessment incorporates additional factors, including personal medical history, family history, and any available genetic information that may result in a personalized plan for cancer prevention and surveillance  RECOMMENDATIONS FOR FAMILY MEMBERS:  Individuals in this family might be at some increased risk of developing cancer, over the general population risk, simply due to the family history of cancer.  We recommended women  in this family have a yearly mammogram beginning at age 24, or 57 years younger than the earliest onset of cancer, an annual clinical breast exam, and perform monthly breast self-exams. Women in this family should also have a gynecological exam as recommended by their primary provider. All family members should have a colonoscopy by age 72.  FOLLOW-UP: Lastly, we discussed with Ms. Kuehnel that cancer genetics is a rapidly advancing field and it is possible that  new genetic tests will be appropriate for her and/or her family members in the future. We encouraged her to remain in contact with cancer genetics on an annual basis so we can update her personal and family histories and let her know of advances in cancer genetics that may benefit this family.   Our contact number was provided. Ms. Ottaway questions were answered to her satisfaction, and she knows she is welcome to call us at anytime with additional questions or concerns.   Roma Kayser, MS, [facility] Certified Genetic Counselor [email or web address]

## 2019-03-04 ENCOUNTER — Other Ambulatory Visit: Payer: Self-pay

## 2019-03-04 ENCOUNTER — Ambulatory Visit (HOSPITAL_COMMUNITY)
Admission: RE | Admit: 2019-03-04 | Discharge: 2019-03-04 | Disposition: A | Discharge status: Home / Self Care | Payer: PPO | Source: Ambulatory Visit | Attending: Hematology | Admitting: Hematology

## 2019-03-04 ENCOUNTER — Other Ambulatory Visit (HOSPITAL_COMMUNITY): Payer: Self-pay | Admitting: Emergency Medicine

## 2019-03-04 ENCOUNTER — Encounter (HOSPITAL_COMMUNITY): Payer: Self-pay

## 2019-03-04 DIAGNOSIS — C251 Malignant neoplasm of body of pancreas: Secondary | ICD-10-CM | POA: Insufficient documentation

## 2019-03-04 DIAGNOSIS — R918 Other nonspecific abnormal finding of lung field: Secondary | ICD-10-CM | POA: Diagnosis not present

## 2019-03-04 MED ORDER — IOHEXOL 300 MG/ML  SOLN
75.0000 mL | Freq: Once | INTRAMUSCULAR | Status: AC | PRN
  Administered 2019-03-04: 60 mL via INTRAVENOUS

## 2019-03-04 MED ORDER — HEPARIN SOD (PORK) LOCK FLUSH 100 UNIT/ML IV SOLN
INTRAVENOUS | Status: AC
  Filled 2019-03-04: qty 5

## 2019-03-08 ENCOUNTER — Inpatient Hospital Stay (HOSPITAL_COMMUNITY): Payer: PPO

## 2019-03-08 ENCOUNTER — Other Ambulatory Visit (HOSPITAL_COMMUNITY): Payer: Self-pay | Admitting: *Deleted

## 2019-03-08 ENCOUNTER — Other Ambulatory Visit (HOSPITAL_COMMUNITY): Payer: Self-pay | Admitting: Emergency Medicine

## 2019-03-08 ENCOUNTER — Telehealth (HOSPITAL_COMMUNITY): Payer: Self-pay | Admitting: *Deleted

## 2019-03-08 ENCOUNTER — Ambulatory Visit (HOSPITAL_COMMUNITY): Payer: Self-pay | Admitting: Hematology

## 2019-03-08 ENCOUNTER — Other Ambulatory Visit: Payer: Self-pay

## 2019-03-08 DIAGNOSIS — C251 Malignant neoplasm of body of pancreas: Secondary | ICD-10-CM

## 2019-03-08 DIAGNOSIS — Z5111 Encounter for antineoplastic chemotherapy: Secondary | ICD-10-CM | POA: Diagnosis not present

## 2019-03-08 LAB — CBC WITH DIFFERENTIAL/PLATELET
Abs Immature Granulocytes: 0.04 10*3/uL (ref 0.00–0.07)
Basophils Absolute: 0.1 10*3/uL (ref 0.0–0.1)
Basophils Relative: 1 %
Eosinophils Absolute: 0.2 10*3/uL (ref 0.0–0.5)
Eosinophils Relative: 2 %
HCT: 35.2 % — ABNORMAL LOW (ref 36.0–46.0)
Hemoglobin: 11.2 g/dL — ABNORMAL LOW (ref 12.0–15.0)
Immature Granulocytes: 1 %
Lymphocytes Relative: 18 %
Lymphs Abs: 1.6 10*3/uL (ref 0.7–4.0)
MCH: 29.9 pg (ref 26.0–34.0)
MCHC: 31.8 g/dL (ref 30.0–36.0)
MCV: 94.1 fL (ref 80.0–100.0)
Monocytes Absolute: 0.7 10*3/uL (ref 0.1–1.0)
Monocytes Relative: 9 %
Neutro Abs: 6 10*3/uL (ref 1.7–7.7)
Neutrophils Relative %: 69 %
Platelets: 347 10*3/uL (ref 150–400)
RBC: 3.74 MIL/uL — ABNORMAL LOW (ref 3.87–5.11)
RDW: 16.6 % — ABNORMAL HIGH (ref 11.5–15.5)
WBC: 8.6 10*3/uL (ref 4.0–10.5)
nRBC: 0 % (ref 0.0–0.2)

## 2019-03-08 LAB — COMPREHENSIVE METABOLIC PANEL
ALT: 12 U/L (ref 0–44)
AST: 15 U/L (ref 15–41)
Albumin: 3.2 g/dL — ABNORMAL LOW (ref 3.5–5.0)
Alkaline Phosphatase: 88 U/L (ref 38–126)
Anion gap: 12 (ref 5–15)
BUN: 13 mg/dL (ref 8–23)
CO2: 21 mmol/L — ABNORMAL LOW (ref 22–32)
Calcium: 8.8 mg/dL — ABNORMAL LOW (ref 8.9–10.3)
Chloride: 103 mmol/L (ref 98–111)
Creatinine, Ser: 0.7 mg/dL (ref 0.44–1.00)
GFR calc Af Amer: >60 mL/min (ref >60)
GFR calc non Af Amer: >60 mL/min (ref >60)
Glucose, Bld: 208 mg/dL — ABNORMAL HIGH (ref 70–99)
Potassium: 3.5 mmol/L (ref 3.5–5.1)
Sodium: 136 mmol/L (ref 135–145)
Total Bilirubin: 0.4 mg/dL (ref 0.3–1.2)
Total Protein: 6.3 g/dL — ABNORMAL LOW (ref 6.5–8.1)

## 2019-03-09 ENCOUNTER — Encounter (HOSPITAL_COMMUNITY): Payer: Self-pay | Admitting: Hematology

## 2019-03-09 ENCOUNTER — Inpatient Hospital Stay (HOSPITAL_BASED_OUTPATIENT_CLINIC_OR_DEPARTMENT_OTHER): Payer: PPO | Admitting: Hematology

## 2019-03-09 ENCOUNTER — Other Ambulatory Visit (HOSPITAL_COMMUNITY): Payer: Self-pay | Admitting: Hematology

## 2019-03-09 ENCOUNTER — Inpatient Hospital Stay (HOSPITAL_COMMUNITY): Payer: PPO

## 2019-03-09 VITALS — BP 156/44 | HR 77 | Temp 98.2°F | Resp 18

## 2019-03-09 DIAGNOSIS — Z853 Personal history of malignant neoplasm of breast: Secondary | ICD-10-CM | POA: Diagnosis not present

## 2019-03-09 DIAGNOSIS — C251 Malignant neoplasm of body of pancreas: Secondary | ICD-10-CM | POA: Diagnosis not present

## 2019-03-09 DIAGNOSIS — Z7984 Long term (current) use of oral hypoglycemic drugs: Secondary | ICD-10-CM | POA: Diagnosis not present

## 2019-03-09 DIAGNOSIS — Z79899 Other long term (current) drug therapy: Secondary | ICD-10-CM

## 2019-03-09 DIAGNOSIS — Z5111 Encounter for antineoplastic chemotherapy: Secondary | ICD-10-CM | POA: Diagnosis not present

## 2019-03-09 DIAGNOSIS — E119 Type 2 diabetes mellitus without complications: Secondary | ICD-10-CM

## 2019-03-09 DIAGNOSIS — J449 Chronic obstructive pulmonary disease, unspecified: Secondary | ICD-10-CM

## 2019-03-09 DIAGNOSIS — C787 Secondary malignant neoplasm of liver and intrahepatic bile duct: Secondary | ICD-10-CM

## 2019-03-09 DIAGNOSIS — C259 Malignant neoplasm of pancreas, unspecified: Secondary | ICD-10-CM

## 2019-03-09 DIAGNOSIS — Z800 Family history of malignant neoplasm of digestive organs: Secondary | ICD-10-CM

## 2019-03-09 DIAGNOSIS — F17210 Nicotine dependence, cigarettes, uncomplicated: Secondary | ICD-10-CM

## 2019-03-09 LAB — CANCER ANTIGEN 19-9: CA 19-9: 32804 U/mL — ABNORMAL HIGH (ref 0–35)

## 2019-03-09 MED ORDER — SODIUM CHLORIDE 0.9 % IV SOLN
2400.0000 mg/m2 | INTRAVENOUS | Status: DC
  Administered 2019-03-09: 3700 mg via INTRAVENOUS
  Filled 2019-03-09: qty 74

## 2019-03-09 MED ORDER — PALONOSETRON HCL INJECTION 0.25 MG/5ML
0.2500 mg | Freq: Once | INTRAVENOUS | Status: AC
  Administered 2019-03-09: 0.25 mg via INTRAVENOUS
  Filled 2019-03-09: qty 5

## 2019-03-09 MED ORDER — SODIUM CHLORIDE 0.9% FLUSH
10.0000 mL | INTRAVENOUS | Status: DC | PRN
  Administered 2019-03-09: 10 mL
  Filled 2019-03-09: qty 10

## 2019-03-09 MED ORDER — SODIUM CHLORIDE 0.9 % IV SOLN
400.0000 mg/m2 | Freq: Once | INTRAVENOUS | Status: AC
  Administered 2019-03-09: 620 mg via INTRAVENOUS
  Filled 2019-03-09: qty 31

## 2019-03-09 MED ORDER — SODIUM CHLORIDE 0.9 % IV SOLN
150.0000 mg/m2 | Freq: Once | INTRAVENOUS | Status: AC
  Administered 2019-03-09: 240 mg via INTRAVENOUS
  Filled 2019-03-09: qty 10

## 2019-03-09 MED ORDER — ATROPINE SULFATE 1 MG/ML IJ SOLN
0.5000 mg | Freq: Once | INTRAMUSCULAR | Status: AC | PRN
  Administered 2019-03-09: 0.5 mg via INTRAVENOUS
  Filled 2019-03-09: qty 1

## 2019-03-09 MED ORDER — DEXTROSE 5 % IV SOLN
Freq: Once | INTRAVENOUS | Status: AC
  Administered 2019-03-09: 10:00:00 via INTRAVENOUS

## 2019-03-09 MED ORDER — SODIUM CHLORIDE 0.9 % IV SOLN
10.0000 mg | Freq: Once | INTRAVENOUS | Status: AC
  Administered 2019-03-09: 10 mg via INTRAVENOUS
  Filled 2019-03-09: qty 1

## 2019-03-09 MED ORDER — OXYCODONE-ACETAMINOPHEN 5-325 MG PO TABS
1.0000 | ORAL_TABLET | Freq: Once | ORAL | Status: AC
  Administered 2019-03-09: 1 via ORAL
  Filled 2019-03-09: qty 1

## 2019-03-09 MED ORDER — OXYCODONE-ACETAMINOPHEN 5-325 MG PO TABS: 1.0000 | ORAL_TABLET | Freq: Four times a day (QID) | ORAL | 0 refills | Status: DC | PRN

## 2019-03-09 MED ORDER — OXALIPLATIN CHEMO INJECTION 100 MG/20ML
85.0000 mg/m2 | Freq: Once | INTRAVENOUS | Status: AC
  Administered 2019-03-09: 130 mg via INTRAVENOUS
  Filled 2019-03-09: qty 10

## 2019-03-09 NOTE — Patient Instructions (Addendum)
[place] at [facility] Discharge Instructions  You were seen today by Dr. Delton Coombes. He went over your recent lab results. He will see you back in 2 weeks for labs, treatment and follow up.  Take 2 pills of Imodium at the onset of diarrhea and then 1 pill after every watery bowel movement until diarrhea stops.  Thank you for choosing [place] at [facility] to provide your oncology and hematology care.  To afford each patient quality time with our provider, please arrive at least 15 minutes before your scheduled appointment time.   If you have a lab appointment with the [facility] please come in thru the  Main Entrance and check in at the main information desk  You need to re-schedule your appointment should you arrive 10 or more minutes late.  We strive to give you quality time with our providers, and arriving late affects you and other patients whose appointments are after yours.  Also, if you no show three or more times for appointments you may be dismissed from the clinic at the providers discretion.     Again, thank you for choosing [facility].  Our hope is that these requests will decrease the amount of time that you wait before being seen by our physicians.       _____________________________________________________________  Should you have questions after your visit to [facility], please contact our office at [phone] between the hours of 8:00 a.m. and 4:30 p.m.  Voicemails left after 4:00 p.m. will not be returned until the following business day.  For prescription refill requests, have your pharmacy contact our office and allow 72 hours.    Cancer Center Support Programs:   > Cancer Support Group  2nd Tuesday of the month 1pm-2pm, Journey Room

## 2019-03-09 NOTE — Assessment & Plan Note (Addendum)
1.  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 -She reports 50 pound weight loss in the last 1 year, 16 pounds since December 2019.  She lives by herself at home. - Patient was evaluated by Dr. BJulieanne Mansonand received cycle 1 of FOLFOX on 02/21/2019. -She did experience slight diarrhea with it.  She also had some cold sensitivity.  She was counseled to drink and eat everything at room temperature for the first week. - CT chest performed on 03/04/2019 revealed known 6.1 cm pancreatic tail/body mass, hepatic mets, as well as pulmonary nodules.  Left leg Doppler did not show any DVT. - Labs are acceptable to proceed with treatment today. I have added Irinotecan at 150 mg per metered square for Cycle 2.  We discussed the side effects of Irinotecan in detail. -Her appetite also picked up since we started her on Marinol 2.5 mg twice daily 1 week ago. - RTC in 2 weeks.   2.  Abdominal and back pain: -She is taking Percocet 5 mg between 2 and 4 tablets daily.  We have given her a refill. -Pain has improved with cycle 1 of chemotherapy.  3.  History of breast cancer: -She had left breast cancer at age 73 status post lumpectomy, chemotherapy, and radiation therapy followed by tamoxifen. 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 - She did see geneticist KRoma Kayseron 02/24/2019. Genetic testing did not reveal any gene mutations.   4.  Family history: -Mother had pancreatic cancer.  Father had liver cancer. - Paternal grand father had liver cancer.  Sister had breast cancer.

## 2019-03-09 NOTE — Progress Notes (Signed)
Treatment given today per MD orders. Tolerated infusion without adverse affects. Vital signs stable. No complaints at this time. 27F/U pump connected and RUN noted on pump. Discharged from clinic ambulatory. F/U with [facility] as scheduled.

## 2019-03-09 NOTE — Progress Notes (Signed)
Kristine Rodriguez, [place] [id number]   CLINIC:  Medical Oncology/Hematology  PCP:  Sharilyn Sites, [place] [address] [phone]   REASON FOR VISIT:  Follow-up for Metastatic pancreatic cancer   BRIEF ONCOLOGIC HISTORY:    Cancer of pancreas, body ([place])   02/09/2019 Initial Diagnosis    Cancer of pancreas, body ([place])    02/21/2019 -  Chemotherapy    The patient had palonosetron (ALOXI) injection 0.25 mg, 0.25 mg, Intravenous,  Once, 2 of 4 cycles Administration: 0.25 mg (02/21/2019), 0.25 mg (03/09/2019) irinotecan (CAMPTOSAR) 240 mg in sodium chloride 0.9 % 500 mL chemo infusion, 150 mg/m2 = 240 mg (100 % of original dose 150 mg/m2), Intravenous,  Once, 1 of 3 cycles Dose modification: 150 mg/m2 (original dose 150 mg/m2, Cycle 2, Reason: Provider Judgment) Administration: 240 mg (03/09/2019) leucovorin 620 mg in dextrose 5 % 250 mL infusion, 400 mg/m2 = 620 mg, Intravenous,  Once, 2 of 4 cycles Administration: 620 mg (02/21/2019), 620 mg (03/09/2019) oxaliplatin (ELOXATIN) 130 mg in dextrose 5 % 500 mL chemo infusion, 85 mg/m2 = 130 mg, Intravenous,  Once, 2 of 4 cycles Administration: 130 mg (02/21/2019), 130 mg (03/09/2019) fluorouracil (ADRUCIL) 3,700 mg in sodium chloride 0.9 % 76 mL chemo infusion, 2,400 mg/m2 = 3,700 mg, Intravenous, 1 Day/Dose, 2 of 4 cycles Administration: 3,700 mg (02/21/2019), 3,700 mg (03/09/2019)  for chemotherapy treatment.     03/02/2019 Genetic Testing    Negative genetic testing on the common hereditary cancer panel+pancreatic cancer panel+preliminary evidence genes for pancreatic cancer+chronic pancratitis genes.  This Gene Panel offered by [facility] includes sequencing and/or deletion duplication testing of the following 55 genes: APC, ATM, AXIN2, BARD1, BMPR1A, BRCA1, BRCA2, BRIP1, CASR, CDH1, CDK4, CDKN2A (p14ARF), CDKN2A (p16INK4a), CFTR, CHEK2, CPA1, CTNNA1, CTRC, DICER1, EPCAM  (Deletion/duplication testing only), FANCC, GREM1 (promoter region deletion/duplication testing only), KIT, MEN1, MLH1, MSH2, MSH3, MSH6, MUTYH, NBN, NF1, NHTL1, PALB2, PALLD, PDGFRA, PMS2, POLD1, POLE,PRSS1, PTEN, RAD50, RAD51C, RAD51D, SDHB, SDHC, SDHD, SMAD4, SMARCA4. SPINK1, STK11, TP53, TSC1, TSC2, and VHL.  The following genes were evaluated for sequence changes only: SDHA and HOXB13 c.251G>A variant only. The report date is Mar 02, 2019.      CANCER STAGING: Cancer Staging No matching staging information was found for the patient.   INTERVAL HISTORY:  Kristine Rodriguez 73 y.o. female returns for routine follow-up. She states that she and her daughter called all day for pain medication and never received it. She was advised the medication had been sent in more then once to her pharmacy. She has swelling in both feet and ankles. She continues to have pain in the [place] she rated this pain a 10 today. She states that the medication for her appetite is helping. Denies any vomiting. Denies any new pains. Had not noticed any recent bleeding such as epistaxis, hematuria or hematochezia. Denies recent chest pain on exertion, shortness of breath on minimal exertion, pre-syncopal episodes, or palpitations. Denies any numbness or tingling in hands or feet. Denies any recent fevers, infections, or recent hospitalizations. Patient reports appetite at 50% and energy level at 25%.     REVIEW OF SYSTEMS:  Review of Systems  Cardiovascular: Positive for leg swelling.  Gastrointestinal: Positive for diarrhea and nausea.     PAST MEDICAL/SURGICAL HISTORY:  Past Medical History:  Diagnosis Date  . Cancer ([place])    hx of breast cancer left  . Diabetes mellitus   . Family history of  breast cancer   . Family history of pancreatic cancer   . Hypertension   . Tobacco use 02/04/2019   Past Surgical History:  Procedure Laterality Date  . bladder tack    . COLONOSCOPY  06/04/2012   Procedure: COLONOSCOPY;   Surgeon: Jamesetta So, MD;  Location: [facility];  Service: Gastroenterology;  Laterality: N/A;  . ESOPHAGOGASTRODUODENOSCOPY  06/04/2012   Procedure: ESOPHAGOGASTRODUODENOSCOPY (EGD);  Surgeon: Jamesetta So, MD;  Location: [facility];  Service: Gastroenterology;  Laterality: N/A;  . HERNIA REPAIR    . IR IMAGING GUIDED PORT INSERTION  02/18/2019  . IR US GUIDE BX ASP/DRAIN  02/07/2019  . left mastectomy    . TONSILLECTOMY    . VESICO-VAGINAL FISTULA REPAIR       SOCIAL HISTORY:  Social History   Socioeconomic History  . Marital status: Divorced    Spouse name: Not on file  . Number of children: 3  . Years of education: Not on file  . Highest education level: Not on file  Occupational History  . Occupation: retired  Scientific laboratory technician  . Financial resource strain: Not hard at all  . Food insecurity:    Worry: Never true    Inability: Never true  . Transportation needs:    Medical: No    Non-medical: No  Tobacco Use  . Smoking status: Current Every Day Smoker    Packs/day: 0.50    Types: Cigarettes  . Smokeless tobacco: Never Used  Substance and Sexual Activity  . Alcohol use: Yes    Comment: socially drink  . Drug use: No  . Sexual activity: Yes  Lifestyle  . Physical activity:    Days per week: 0 days    Minutes per session: 0 min  . Stress: Only a little  Relationships  . Social connections:    Talks on phone: Twice a week    Gets together: Twice a week    Attends religious service: Never    Active member of club or organization: No    Attends meetings of clubs or organizations: Never    Relationship status: Not on file  . Intimate partner violence:    Fear of current or ex partner: No    Emotionally abused: No    Physically abused: No    Forced sexual activity: No  Other Topics Concern  . Not on file  Social History Narrative  . Not on file    FAMILY HISTORY:  Family History  Problem Relation Age of Onset  . Hypertension Mother   . Pancreatic  cancer Mother 49  . Diabetes Father   . Hypertension Father   . Liver cancer Father   . Pancreatitis Sister   . Breast cancer Sister 80  . Aortic dissection Sister   . Aortic dissection Brother   . Liver cancer Paternal Grandfather     CURRENT MEDICATIONS:  Outpatient Encounter Medications as of 03/09/2019  Medication Sig Note  . albuterol (VENTOLIN HFA) 108 (90 Base) MCG/ACT inhaler Inhale 2 puffs into the lungs every 4 (four) hours as needed for wheezing or shortness of breath.   . B-D ULTRAFINE III SHORT PEN 31G X 8 MM MISC USE ONCE AT BEDTIME 02/21/2019: Not taking  . dronabinol (MARINOL) 2.5 MG capsule Take 1 capsule (2.5 mg total) by mouth 2 (two) times daily before a meal.   . glimepiride (AMARYL) 1 MG tablet Take 1 tablet (1 mg total) by mouth daily as needed. Take only if blood  sugars consistently above 140's   . glucose blood (ONE TOUCH ULTRA TEST) test strip TEST TWICE DAILY   . lidocaine (LIDODERM) 5 % Place 1 patch onto the skin daily. Remove & Discard patch within 12 hours or as directed by MD 02/21/2019: Applies to her back   . lidocaine-prilocaine (EMLA) cream Apply 1 application topically as needed. 1 hour prior to stick and cover with plastic wrap   . Multiple Vitamin (MULTIVITAMIN WITH MINERALS) TABS tablet Take 1 tablet by mouth daily.   Marland Kitchen oxyCODONE-acetaminophen (PERCOCET/ROXICET) 5-325 MG tablet Take 1-2 tablets by mouth every 6 (six) hours as needed for moderate pain or severe pain.   Marland Kitchen prochlorperazine (COMPAZINE) 10 MG tablet Take 1 tablet (10 mg total) by mouth every 6 (six) hours as needed for nausea or vomiting.    No facility-administered encounter medications on file as of 03/09/2019.     ALLERGIES:  Allergies  Allergen Reactions  . Duricef [Cefadroxil] Shortness Of Breath and Swelling  . Tape Rash     PHYSICAL EXAM:  ECOG Performance status: 1  Vitals:   03/09/19 0800  BP: 132/67  Pulse: 96  Resp: 16  Temp: 98.3 F (36.8 C)  SpO2: 100%    Filed Weights   03/09/19 0800  Weight: 115 lb 3 oz (52.2 kg)    Physical Exam Vitals signs reviewed.  Constitutional:      Appearance: Normal appearance.  Cardiovascular:     Rate and Rhythm: Normal rate and regular rhythm.     Heart sounds: Normal heart sounds.  Pulmonary:     Effort: Pulmonary effort is normal.     Breath sounds: Normal breath sounds.  Abdominal:     General: There is no distension.     Palpations: Abdomen is soft. There is no mass.  Musculoskeletal:        General: No swelling.  Skin:    General: Skin is warm.  Neurological:     General: No focal deficit present.     Mental Status: She is alert and oriented to person, place, and time.  Psychiatric:        Mood and Affect: Mood normal.        Behavior: Behavior normal.      LABORATORY DATA:  I have reviewed the labs as listed.  CBC    Component Value Date/Time   WBC 8.6 03/08/2019 1242   RBC 3.74 (L) 03/08/2019 1242   HGB 11.2 (L) 03/08/2019 1242   HGB 10.5 (L) 02/21/2019 1153   HCT 35.2 (L) 03/08/2019 1242   PLT 347 03/08/2019 1242   PLT 345 02/21/2019 1153   MCV 94.1 03/08/2019 1242   MCH 29.9 03/08/2019 1242   MCHC 31.8 03/08/2019 1242   RDW 16.6 (H) 03/08/2019 1242   LYMPHSABS 1.6 03/08/2019 1242   MONOABS 0.7 03/08/2019 1242   EOSABS 0.2 03/08/2019 1242   BASOSABS 0.1 03/08/2019 1242   CMP Latest Ref Rng & Units 03/08/2019 02/21/2019 02/07/2019  Glucose 70 - 99 mg/dL 208(H) 264(H) 74  BUN 8 - 23 mg/dL 13 13 39(H)  Creatinine 0.44 - 1.00 mg/dL 0.70 0.80 1.10(H)  Sodium 135 - 145 mmol/L 136 136 141  Potassium 3.5 - 5.1 mmol/L 3.5 3.9 4.0  Chloride 98 - 111 mmol/L 103 104 115(H)  CO2 22 - 32 mmol/L 21(L) 23 19(L)  Calcium 8.9 - 10.3 mg/dL 8.8(L) 8.2(L) 8.2(L)  Total Protein 6.5 - 8.1 g/dL 6.3(L) 5.7(L) -  Total Bilirubin 0.3 - 1.2 mg/dL 0.4  0.2(L) -  Alkaline Phos 38 - 126 U/L 88 88 -  AST 15 - 41 U/L 15 11(L) -  ALT 0 - 44 U/L 12 14 -       DIAGNOSTIC IMAGING:  I have  independently reviewed the scans and discussed with the patient.   I have reviewed Kristine Lick LPN's note and agree with the documentation.  I personally performed a face-to-face visit, made revisions and my assessment and plan is as follows.    ASSESSMENT & PLAN:   Cancer of pancreas, body ([place]) 1.  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 -She reports 50 pound weight loss in the last 1 year, 16 pounds since December 2019.  She lives by herself at home. - Patient was evaluated by Dr. BJulieanne Mansonand received cycle 1 of FOLFOX on 02/21/2019. -She did experience slight diarrhea with it.  She also had some cold sensitivity.  She was counseled to drink and eat everything at room temperature for the first week. - CT chest performed on 03/04/2019 revealed known 6.1 cm pancreatic tail/body mass, hepatic mets, as well as pulmonary nodules.  Left leg Doppler did not show any DVT. - Labs are acceptable to proceed with treatment today. I have added Irinotecan at 150 mg per metered square for Cycle 2.  We discussed the side effects of Irinotecan in detail. -Her appetite also picked up since we started her on Marinol 2.5 mg twice daily 1 week ago. - RTC in 2 weeks.   2.  Abdominal and back pain: -She is taking Percocet 5 mg between 2 and 4 tablets daily.  We have given her a refill. -Pain has improved with cycle 1 of chemotherapy.  3.  History of breast cancer: -She had left breast cancer at age 73 status post lumpectomy, chemotherapy, and radiation therapy followed by tamoxifen. 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 - She did see geneticist KRoma Kayseron 02/24/2019. Genetic testing did not reveal any gene mutations.   4.  Family history: -Mother had pancreatic cancer.  Father had liver cancer. - Paternal grand father had liver cancer.  Sister had breast cancer.  Total time spent is 25 minutes with more than 50% of the time spent face-to-face discussing treatment plan and coordination of care.    Orders placed this encounter:  No orders of the defined types were placed in this encounter.     SDerek Jack MD [phone]

## 2019-03-09 NOTE — Patient Instructions (Signed)

## 2019-03-09 NOTE — Progress Notes (Signed)
03/09/19  Received order to enter Percocet 5-325 x 1 dose for stat dose in infusion room.  Order entered and noted in [place].  T.O. Dr Beckey Downing LPN/Wanya Bangura Ronnald Ramp, PharmD

## 2019-03-11 ENCOUNTER — Encounter (HOSPITAL_COMMUNITY): Payer: Self-pay

## 2019-03-11 ENCOUNTER — Other Ambulatory Visit: Payer: Self-pay

## 2019-03-11 ENCOUNTER — Inpatient Hospital Stay (HOSPITAL_COMMUNITY): Payer: PPO

## 2019-03-11 VITALS — BP 156/73 | HR 75 | Temp 98.1°F | Resp 18

## 2019-03-11 DIAGNOSIS — C251 Malignant neoplasm of body of pancreas: Secondary | ICD-10-CM

## 2019-03-11 DIAGNOSIS — Z5111 Encounter for antineoplastic chemotherapy: Secondary | ICD-10-CM | POA: Diagnosis not present

## 2019-03-11 MED ORDER — SODIUM CHLORIDE 0.9% FLUSH
10.0000 mL | INTRAVENOUS | Status: DC | PRN
  Administered 2019-03-11: 10 mL
  Filled 2019-03-11: qty 10

## 2019-03-11 MED ORDER — HEPARIN SOD (PORK) LOCK FLUSH 100 UNIT/ML IV SOLN
500.0000 [IU] | Freq: Once | INTRAVENOUS | Status: AC | PRN
  Administered 2019-03-11: 500 [IU]

## 2019-03-11 NOTE — Progress Notes (Signed)
Patients chemotherapy pump disconnected with no complaints voiced.  Port site clean and dry with no bruising or swelling noted.  Flushed easily with blood return noted.  Band aid applied.  VSS with discharge and left ambulatory with no s/s of distress noted.

## 2019-03-15 ENCOUNTER — Other Ambulatory Visit: Payer: Self-pay | Admitting: Oncology

## 2019-03-16 NOTE — Telephone Encounter (Signed)
Next scheduled FU at [facility] with Dr. Delton Coombes is 03-23-2019.

## 2019-03-22 ENCOUNTER — Other Ambulatory Visit (HOSPITAL_COMMUNITY): Payer: Self-pay

## 2019-03-22 ENCOUNTER — Other Ambulatory Visit (HOSPITAL_COMMUNITY): Payer: Self-pay | Admitting: Nurse Practitioner

## 2019-03-22 ENCOUNTER — Other Ambulatory Visit: Payer: Self-pay

## 2019-03-22 ENCOUNTER — Other Ambulatory Visit (HOSPITAL_COMMUNITY): Payer: Self-pay | Admitting: Emergency Medicine

## 2019-03-22 DIAGNOSIS — C251 Malignant neoplasm of body of pancreas: Secondary | ICD-10-CM

## 2019-03-22 MED ORDER — OXYCODONE-ACETAMINOPHEN 5-325 MG PO TABS: 1.0000 | ORAL_TABLET | Freq: Four times a day (QID) | ORAL | 0 refills | Status: DC | PRN

## 2019-03-23 ENCOUNTER — Encounter (HOSPITAL_COMMUNITY): Payer: Self-pay | Admitting: Hematology

## 2019-03-23 ENCOUNTER — Inpatient Hospital Stay (HOSPITAL_COMMUNITY): Payer: PPO

## 2019-03-23 ENCOUNTER — Inpatient Hospital Stay (HOSPITAL_BASED_OUTPATIENT_CLINIC_OR_DEPARTMENT_OTHER): Payer: PPO | Admitting: Hematology

## 2019-03-23 ENCOUNTER — Inpatient Hospital Stay (HOSPITAL_COMMUNITY): Payer: PPO | Attending: Genetic Counselor

## 2019-03-23 DIAGNOSIS — Z853 Personal history of malignant neoplasm of breast: Secondary | ICD-10-CM | POA: Diagnosis not present

## 2019-03-23 DIAGNOSIS — C787 Secondary malignant neoplasm of liver and intrahepatic bile duct: Secondary | ICD-10-CM

## 2019-03-23 DIAGNOSIS — F17210 Nicotine dependence, cigarettes, uncomplicated: Secondary | ICD-10-CM | POA: Insufficient documentation

## 2019-03-23 DIAGNOSIS — Z5111 Encounter for antineoplastic chemotherapy: Secondary | ICD-10-CM | POA: Insufficient documentation

## 2019-03-23 DIAGNOSIS — C251 Malignant neoplasm of body of pancreas: Secondary | ICD-10-CM

## 2019-03-23 DIAGNOSIS — Z79899 Other long term (current) drug therapy: Secondary | ICD-10-CM | POA: Diagnosis not present

## 2019-03-23 DIAGNOSIS — E119 Type 2 diabetes mellitus without complications: Secondary | ICD-10-CM | POA: Insufficient documentation

## 2019-03-23 DIAGNOSIS — Z7984 Long term (current) use of oral hypoglycemic drugs: Secondary | ICD-10-CM | POA: Diagnosis not present

## 2019-03-23 DIAGNOSIS — Z800 Family history of malignant neoplasm of digestive organs: Secondary | ICD-10-CM | POA: Insufficient documentation

## 2019-03-23 DIAGNOSIS — Z5189 Encounter for other specified aftercare: Secondary | ICD-10-CM | POA: Diagnosis not present

## 2019-03-23 DIAGNOSIS — Z803 Family history of malignant neoplasm of breast: Secondary | ICD-10-CM | POA: Insufficient documentation

## 2019-03-23 DIAGNOSIS — F329 Major depressive disorder, single episode, unspecified: Secondary | ICD-10-CM | POA: Insufficient documentation

## 2019-03-23 LAB — COMPREHENSIVE METABOLIC PANEL
ALT: 12 U/L (ref 0–44)
AST: 12 U/L — ABNORMAL LOW (ref 15–41)
Albumin: 2.6 g/dL — ABNORMAL LOW (ref 3.5–5.0)
Alkaline Phosphatase: 79 U/L (ref 38–126)
Anion gap: 16 — ABNORMAL HIGH (ref 5–15)
BUN: 16 mg/dL (ref 8–23)
CO2: 21 mmol/L — ABNORMAL LOW (ref 22–32)
Calcium: 8.6 mg/dL — ABNORMAL LOW (ref 8.9–10.3)
Chloride: 99 mmol/L (ref 98–111)
Creatinine, Ser: 0.8 mg/dL (ref 0.44–1.00)
GFR calc Af Amer: >60 mL/min (ref >60)
GFR calc non Af Amer: >60 mL/min (ref >60)
Glucose, Bld: 209 mg/dL — ABNORMAL HIGH (ref 70–99)
Potassium: 3.8 mmol/L (ref 3.5–5.1)
Sodium: 136 mmol/L (ref 135–145)
Total Bilirubin: 0.4 mg/dL (ref 0.3–1.2)
Total Protein: 5.8 g/dL — ABNORMAL LOW (ref 6.5–8.1)

## 2019-03-23 LAB — CBC WITH DIFFERENTIAL/PLATELET
Abs Immature Granulocytes: 0.04 10*3/uL (ref 0.00–0.07)
Basophils Absolute: 0 10*3/uL (ref 0.0–0.1)
Basophils Relative: 0 %
Eosinophils Absolute: 0.1 10*3/uL (ref 0.0–0.5)
Eosinophils Relative: 5 %
HCT: 31 % — ABNORMAL LOW (ref 36.0–46.0)
Hemoglobin: 9.8 g/dL — ABNORMAL LOW (ref 12.0–15.0)
Immature Granulocytes: 2 %
Lymphocytes Relative: 43 %
Lymphs Abs: 1.1 10*3/uL (ref 0.7–4.0)
MCH: 30 pg (ref 26.0–34.0)
MCHC: 31.6 g/dL (ref 30.0–36.0)
MCV: 94.8 fL (ref 80.0–100.0)
Monocytes Absolute: 0.5 10*3/uL (ref 0.1–1.0)
Monocytes Relative: 21 %
Neutro Abs: 0.7 10*3/uL — ABNORMAL LOW (ref 1.7–7.7)
Neutrophils Relative %: 29 %
Platelets: 167 10*3/uL (ref 150–400)
RBC: 3.27 MIL/uL — ABNORMAL LOW (ref 3.87–5.11)
RDW: 16.1 % — ABNORMAL HIGH (ref 11.5–15.5)
WBC: 2.5 10*3/uL — ABNORMAL LOW (ref 4.0–10.5)
nRBC: 0 % (ref 0.0–0.2)

## 2019-03-23 MED ORDER — HEPARIN SOD (PORK) LOCK FLUSH 100 UNIT/ML IV SOLN
500.0000 [IU] | Freq: Once | INTRAVENOUS | Status: AC
  Administered 2019-03-23: 500 [IU] via INTRAVENOUS

## 2019-03-23 MED ORDER — SODIUM CHLORIDE 0.9% FLUSH
10.0000 mL | Freq: Once | INTRAVENOUS | Status: AC
  Administered 2019-03-23: 10 mL via INTRAVENOUS

## 2019-03-23 MED ORDER — DRONABINOL 5 MG PO CAPS: 5.0000 mg | ORAL_CAPSULE | Freq: Two times a day (BID) | ORAL | 1 refills | Status: AC

## 2019-03-23 NOTE — Patient Instructions (Signed)
[place] at [facility]  Discharge Instructions:  No treatment today per MD. Held due to [facility] _______________________________________________________________  Thank you for choosing [place] at [facility] to provide your oncology and hematology care.  To afford each patient quality time with our providers, please arrive at least 15 minutes before your scheduled appointment.  You need to re-schedule your appointment if you arrive 10 or more minutes late.  We strive to give you quality time with our providers, and arriving late affects you and other patients whose appointments are after yours.  Also, if you no show three or more times for appointments you may be dismissed from the clinic.  Again, thank you for choosing [place] [facility] hope is that these requests will allow you access to exceptional care and in a timely manner. _______________________________________________________________  If you have questions after your visit, please contact our office at [phone] between the hours of 8:30 a.m. and 5:00 p.m. Voicemails left after 4:30 p.m. will not be returned until the following business day. _______________________________________________________________  For prescription refill requests, have your pharmacy contact our office. _______________________________________________________________  Recommendations made by the consultant and any test results will be sent to your referring physician. _______________________________________________________________

## 2019-03-23 NOTE — Progress Notes (Signed)
Kristine Rodriguez, [place] [id number]   CLINIC:  Medical Oncology/Hematology  PCP:  Sharilyn Sites, [address] [place] [address] [phone]   REASON FOR VISIT:  Follow-up for Metastatic pancreatic cancer   BRIEF ONCOLOGIC HISTORY:    Cancer of pancreas, body ([place])   02/09/2019 Initial Diagnosis    Cancer of pancreas, body ([place])    02/21/2019 -  Chemotherapy    The patient had palonosetron (ALOXI) injection 0.25 mg, 0.25 mg, Intravenous,  Once, 2 of 4 cycles Administration: 0.25 mg (02/21/2019), 0.25 mg (03/09/2019) pegfilgrastim (NEULASTA) injection 6 mg, 6 mg, Subcutaneous, Once, 0 of 2 cycles irinotecan (CAMPTOSAR) 240 mg in sodium chloride 0.9 % 500 mL chemo infusion, 150 mg/m2 = 240 mg (100 % of original dose 150 mg/m2), Intravenous,  Once, 1 of 3 cycles Dose modification: 150 mg/m2 (original dose 150 mg/m2, Cycle 2, Reason: Provider Judgment) Administration: 240 mg (03/09/2019) leucovorin 620 mg in dextrose 5 % 250 mL infusion, 400 mg/m2 = 620 mg, Intravenous,  Once, 2 of 4 cycles Administration: 620 mg (02/21/2019), 620 mg (03/09/2019) oxaliplatin (ELOXATIN) 130 mg in dextrose 5 % 500 mL chemo infusion, 85 mg/m2 = 130 mg, Intravenous,  Once, 2 of 4 cycles Administration: 130 mg (02/21/2019), 130 mg (03/09/2019) fluorouracil (ADRUCIL) 3,700 mg in sodium chloride 0.9 % 76 mL chemo infusion, 2,400 mg/m2 = 3,700 mg, Intravenous, 1 Day/Dose, 2 of 4 cycles Administration: 3,700 mg (02/21/2019), 3,700 mg (03/09/2019)  for chemotherapy treatment.     03/02/2019 Genetic Testing    Negative genetic testing on the common hereditary cancer panel+pancreatic cancer panel+preliminary evidence genes for pancreatic cancer+chronic pancratitis genes.  This Gene Panel offered by [facility] includes sequencing and/or deletion duplication testing of the following 55 genes: APC, ATM, AXIN2, BARD1, BMPR1A, BRCA1, BRCA2, BRIP1, CASR, CDH1, CDK4, CDKN2A (p14ARF),  CDKN2A (p16INK4a), CFTR, CHEK2, CPA1, CTNNA1, CTRC, DICER1, EPCAM (Deletion/duplication testing only), FANCC, GREM1 (promoter region deletion/duplication testing only), KIT, MEN1, MLH1, MSH2, MSH3, MSH6, MUTYH, NBN, NF1, NHTL1, PALB2, PALLD, PDGFRA, PMS2, POLD1, POLE,PRSS1, PTEN, RAD50, RAD51C, RAD51D, SDHB, SDHC, SDHD, SMAD4, SMARCA4. SPINK1, STK11, TP53, TSC1, TSC2, and VHL.  The following genes were evaluated for sequence changes only: SDHA and HOXB13 c.251G>A variant only. The report date is Mar 02, 2019.      CANCER STAGING: Cancer Staging No matching staging information was found for the patient.   INTERVAL HISTORY:  Kristine Rodriguez 73 y.o. female seen today for follow-up on cycle 3 of chemotherapy.  She received cycle 2 on 03/09/2019.  She has experienced more fatigue.  She had gagging and slight vomiting.  Fatigue has improved second week.  She had experienced loose stools but no clear diarrhea.  Her appetite has not been staying good.  She is taking Marinol 2.5 mg twice daily.  She has some cold sensitivity but denies any tingling or numbness next 20s.  Appetite and energy levels are 25%.  Pain in the back has been stable.  She is taking up to 4 pain tablets daily.     REVIEW OF SYSTEMS:  Review of Systems  Cardiovascular: Positive for leg swelling.  Gastrointestinal: Positive for diarrhea and nausea.     PAST MEDICAL/SURGICAL HISTORY:  Past Medical History:  Diagnosis Date  . Cancer ([place])    hx of breast cancer left  . Diabetes mellitus   . Family history of breast cancer   . Family history of pancreatic cancer   . Hypertension   .  Tobacco use 02/04/2019   Past Surgical History:  Procedure Laterality Date  . bladder tack    . COLONOSCOPY  06/04/2012   Procedure: COLONOSCOPY;  Surgeon: Jamesetta So, MD;  Location: [facility];  Service: Gastroenterology;  Laterality: N/A;  . ESOPHAGOGASTRODUODENOSCOPY  06/04/2012   Procedure: ESOPHAGOGASTRODUODENOSCOPY (EGD);  Surgeon: Jamesetta So, MD;  Location: [facility];  Service: Gastroenterology;  Laterality: N/A;  . HERNIA REPAIR    . IR IMAGING GUIDED PORT INSERTION  02/18/2019  . IR US GUIDE BX ASP/DRAIN  02/07/2019  . left mastectomy    . TONSILLECTOMY    . VESICO-VAGINAL FISTULA REPAIR       SOCIAL HISTORY:  Social History   Socioeconomic History  . Marital status: Divorced    Spouse name: Not on file  . Number of children: 3  . Years of education: Not on file  . Highest education level: Not on file  Occupational History  . Occupation: retired  Scientific laboratory technician  . Financial resource strain: Not hard at all  . Food insecurity:    Worry: Never true    Inability: Never true  . Transportation needs:    Medical: No    Non-medical: No  Tobacco Use  . Smoking status: Current Every Day Smoker    Packs/day: 0.50    Types: Cigarettes  . Smokeless tobacco: Never Used  Substance and Sexual Activity  . Alcohol use: Yes    Comment: socially drink  . Drug use: No  . Sexual activity: Yes  Lifestyle  . Physical activity:    Days per week: 0 days    Minutes per session: 0 min  . Stress: Only a little  Relationships  . Social connections:    Talks on phone: Twice a week    Gets together: Twice a week    Attends religious service: Never    Active member of club or organization: No    Attends meetings of clubs or organizations: Never    Relationship status: Not on file  . Intimate partner violence:    Fear of current or ex partner: No    Emotionally abused: No    Physically abused: No    Forced sexual activity: No  Other Topics Concern  . Not on file  Social History Narrative  . Not on file    FAMILY HISTORY:  Family History  Problem Relation Age of Onset  . Hypertension Mother   . Pancreatic cancer Mother 34  . Diabetes Father   . Hypertension Father   . Liver cancer Father   . Pancreatitis Sister   . Breast cancer Sister 86  . Aortic dissection Sister   . Aortic dissection Brother   .  Liver cancer Paternal Grandfather     CURRENT MEDICATIONS:  Outpatient Encounter Medications as of 03/23/2019  Medication Sig Note  . albuterol (VENTOLIN HFA) 108 (90 Base) MCG/ACT inhaler Inhale 2 puffs into the lungs every 4 (four) hours as needed for wheezing or shortness of breath.   . B-D ULTRAFINE III SHORT PEN 31G X 8 MM MISC USE ONCE AT BEDTIME 02/21/2019: Not taking  . dronabinol (MARINOL) 5 MG capsule Take 1 capsule (5 mg total) by mouth 2 (two) times daily before a meal.   . glimepiride (AMARYL) 1 MG tablet Take 1 tablet (1 mg total) by mouth daily as needed. Take only if blood sugars consistently above 140's   . glucose blood (ONE TOUCH ULTRA TEST) test strip TEST TWICE  DAILY   . lidocaine (LIDODERM) 5 % Place 1 patch onto the skin daily. Remove & Discard patch within 12 hours or as directed by MD 02/21/2019: Applies to her back   . lidocaine-prilocaine (EMLA) cream Apply 1 application topically as needed. 1 hour prior to stick and cover with plastic wrap   . Multiple Vitamin (MULTIVITAMIN WITH MINERALS) TABS tablet Take 1 tablet by mouth daily. (Patient not taking: Reported on 03/23/2019)   . oxyCODONE-acetaminophen (PERCOCET/ROXICET) 5-325 MG tablet Take 1-2 tablets by mouth every 6 (six) hours as needed for moderate pain or severe pain.   Marland Kitchen prochlorperazine (COMPAZINE) 10 MG tablet TAKE 1 TABLET(10 MG) BY MOUTH EVERY 6 HOURS AS NEEDED FOR NAUSEA OR VOMITING   . [DISCONTINUED] dronabinol (MARINOL) 2.5 MG capsule Take 1 capsule (2.5 mg total) by mouth 2 (two) times daily before a meal.   . [DISCONTINUED] oxyCODONE-acetaminophen (PERCOCET/ROXICET) 5-325 MG tablet Take 1-2 tablets by mouth every 6 (six) hours as needed for moderate pain or severe pain.    No facility-administered encounter medications on file as of 03/23/2019.     ALLERGIES:  Allergies  Allergen Reactions  . Duricef [Cefadroxil] Shortness Of Breath and Swelling  . Tape Rash     PHYSICAL EXAM:  ECOG Performance  status: 1  Vitals:   03/23/19 0821  BP: 122/64  Pulse: 77  Resp: 16  Temp: 97.8 F (36.6 C)  SpO2: 99%   Filed Weights   03/23/19 0821  Weight: 112 lb 9.6 oz (51.1 kg)    Physical Exam Vitals signs reviewed.  Constitutional:      Appearance: Normal appearance.  Cardiovascular:     Rate and Rhythm: Normal rate and regular rhythm.     Heart sounds: Normal heart sounds.  Pulmonary:     Effort: Pulmonary effort is normal.     Breath sounds: Normal breath sounds.  Abdominal:     General: There is no distension.     Palpations: Abdomen is soft. There is no mass.  Musculoskeletal:        General: No swelling.  Skin:    General: Skin is warm.  Neurological:     General: No focal deficit present.     Mental Status: She is alert and oriented to person, place, and time.  Psychiatric:        Mood and Affect: Mood normal.        Behavior: Behavior normal.      LABORATORY DATA:  I have reviewed the labs as listed.  CBC    Component Value Date/Time   WBC 2.5 (L) 03/23/2019 0808   RBC 3.27 (L) 03/23/2019 0808   HGB 9.8 (L) 03/23/2019 0808   HGB 10.5 (L) 02/21/2019 1153   HCT 31.0 (L) 03/23/2019 0808   PLT 167 03/23/2019 0808   PLT 345 02/21/2019 1153   MCV 94.8 03/23/2019 0808   MCH 30.0 03/23/2019 0808   MCHC 31.6 03/23/2019 0808   RDW 16.1 (H) 03/23/2019 0808   LYMPHSABS 1.1 03/23/2019 0808   MONOABS 0.5 03/23/2019 0808   EOSABS 0.1 03/23/2019 0808   BASOSABS 0.0 03/23/2019 0808   CMP Latest Ref Rng & Units 03/23/2019 03/08/2019 02/21/2019  Glucose 70 - 99 mg/dL 209(H) 208(H) 264(H)  BUN 8 - 23 mg/dL 16 13 13   Creatinine 0.44 - 1.00 mg/dL 0.80 0.70 0.80  Sodium 135 - 145 mmol/L 136 136 136  Potassium 3.5 - 5.1 mmol/L 3.8 3.5 3.9  Chloride 98 - 111 mmol/L 99 103  104  CO2 22 - 32 mmol/L 21(L) 21(L) 23  Calcium 8.9 - 10.3 mg/dL 8.6(L) 8.8(L) 8.2(L)  Total Protein 6.5 - 8.1 g/dL 5.8(L) 6.3(L) 5.7(L)  Total Bilirubin 0.3 - 1.2 mg/dL 0.4 0.4 0.2(L)  Alkaline Phos  38 - 126 U/L 79 88 88  AST 15 - 41 U/L 12(L) 15 11(L)  ALT 0 - 44 U/L 12 12 14        DIAGNOSTIC IMAGING:  I have independently reviewed the scans and discussed with the patient.   I have reviewed Venita Lick LPN's note and agree with the documentation.  I personally performed a face-to-face visit, made revisions and my assessment and plan is as follows.    ASSESSMENT & PLAN:   Cancer of pancreas, body ([place]) 1.  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 -She reports 50 pound weight loss in the last 1 year, 16 pounds since December 2019.  She lives by herself at home. - Patient was evaluated by Dr. BJulieanne Mansonand received cycle 1 of FOLFOX on 02/21/2019. -She did experience slight diarrhea with it.  She also had some cold sensitivity.  She was counseled to drink and eat everything at room temperature for the first week. - CT chest performed on 03/04/2019 revealed known 6.1 cm pancreatic tail/body mass, hepatic mets, as well as pulmonary nodules.  Left leg Doppler did not show any DVT. -Cycle 2 of FOLFIRINOX on 03/09/2019.  She had experienced some fatigue after chemotherapy.  She did have vomited few times.  She reported loose stools but no diarrhea. -We reviewed her blood work.  ANC is 700.  We will hold her chemotherapy this week. -We will reassess her next week.  I plan to add Neulasta to the regimen.   2.  Abdominal and back pain: -She is taking Percocet 5 mg between 2 and 4 tablets daily.  We have given her a refill. -Pain has improved with cycle 1 of chemotherapy.  3.  History of breast cancer: -She had left breast cancer at age 73 status post  lumpectomy, chemotherapy, and radiation therapy followed by tamoxifen. 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  4.  Family history: -Mother had pancreatic cancer.  Father had liver cancer. - Paternal grand father had liver cancer.  Sister had breast cancer. -Genetic testing on 02/24/2019 was negative.  5.  Appetite: -She is taking Marinol 2.5 mg twice daily for her appetite.  This has not been helping lately. -I will increase Marinol to 5 mg twice daily.  Total time spent is 25 minutes with more than 50% of the time spent face-to-face discussing treatment plan and coordination of care.    Orders placed this encounter:  No orders of the defined types were placed in this encounter.     SDerek Jack MD [phone]

## 2019-03-23 NOTE — Patient Instructions (Addendum)
Kristine Rodriguez at [facility] Discharge Instructions  You were seen today by Dr. Delton Coombes. He went over your recent lab results. He will see you back in  week for labs and follow up.   Thank you for choosing [facility] at [facility] to provide your oncology and hematology care.  To afford each patient quality time with our provider, please arrive at least 15 minutes before your scheduled appointment time.   If you have a lab appointment with the [facility] please come in thru the  Main Entrance and check in at the main information desk  You need to re-schedule your appointment should you arrive 10 or more minutes late.  We strive to give you quality time with our providers, and arriving late affects you and other patients whose appointments are after yours.  Also, if you no show three or more times for appointments you may be dismissed from the clinic at the providers discretion.     Again, thank you for choosing [facility].  Our hope is that these requests will decrease the amount of time that you wait before being seen by our physicians.       _____________________________________________________________  Should you have questions after your visit to [facility], please contact our office at [phone] between the hours of 8:00 a.m. and 4:30 p.m.  Voicemails left after 4:00 p.m. will not be returned until the following business day.  For prescription refill requests, have your pharmacy contact our office and allow 72 hours.    Cancer Center Support Programs:   > Cancer Support Group  2nd Tuesday of the month 1pm-2pm, Journey Room

## 2019-03-23 NOTE — Assessment & Plan Note (Addendum)
1.  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 -She reports 50 pound weight loss in the last 1 year, 16 pounds since December 2019.  She lives by herself at home. - Patient was evaluated by Dr. BJulieanne Mansonand received cycle 1 of FOLFOX on 02/21/2019. -She did experience slight diarrhea with it.  She also had some cold sensitivity.  She was counseled to drink and eat everything at room temperature for the first week. - CT chest performed on 03/04/2019 revealed known 6.1 cm pancreatic tail/body mass, hepatic mets, as well as pulmonary nodules.  Left leg Doppler did not show any DVT. -Cycle 2 of FOLFIRINOX on 03/09/2019.  She had experienced some fatigue after chemotherapy.  She did have vomited few times.  She reported loose stools but no diarrhea. -We reviewed her blood work.  ANC is 700.  We will hold her chemotherapy this week. -We will reassess her next week.  I plan to add Neulasta to the regimen.   2.  Abdominal and back pain: -She is taking Percocet 5 mg between 2 and 4 tablets daily.  We have given her a refill. -Pain has improved with cycle 1 of chemotherapy.  3.  History of breast cancer: -She had left breast cancer at age 73 status post lumpectomy, chemotherapy, and radiation therapy followed by tamoxifen. 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  4.  Family history: -Mother had pancreatic cancer.  Father had liver cancer. - Paternal grand father had liver cancer.  Sister had breast cancer. -Genetic testing on 02/24/2019 was negative.  5.  Appetite: -She is taking Marinol 2.5 mg twice  daily for her appetite.  This has not been helping lately. -I will increase Marinol to 5 mg twice daily.

## 2019-03-23 NOTE — Progress Notes (Signed)
ANC is 700, labs reviewed with MD today at office visit. Will hold treatment today and reschedule for next week per MD.    Discharged home from clinic ambulatory. Follow up as scheduled.

## 2019-03-25 ENCOUNTER — Encounter (HOSPITAL_COMMUNITY): Payer: PPO

## 2019-03-29 ENCOUNTER — Other Ambulatory Visit (HOSPITAL_COMMUNITY): Payer: Self-pay | Admitting: Emergency Medicine

## 2019-03-30 ENCOUNTER — Ambulatory Visit: Payer: PPO | Admitting: "Endocrinology

## 2019-03-31 ENCOUNTER — Encounter: Payer: Self-pay | Admitting: "Endocrinology

## 2019-04-01 ENCOUNTER — Other Ambulatory Visit: Payer: Self-pay

## 2019-04-02 ENCOUNTER — Other Ambulatory Visit: Payer: Self-pay | Admitting: Oncology

## 2019-04-04 ENCOUNTER — Other Ambulatory Visit (HOSPITAL_COMMUNITY): Payer: Self-pay | Admitting: *Deleted

## 2019-04-04 ENCOUNTER — Inpatient Hospital Stay (HOSPITAL_COMMUNITY): Payer: PPO

## 2019-04-04 ENCOUNTER — Other Ambulatory Visit: Payer: Self-pay

## 2019-04-04 ENCOUNTER — Encounter (HOSPITAL_COMMUNITY): Payer: Self-pay | Admitting: Hematology

## 2019-04-04 ENCOUNTER — Inpatient Hospital Stay (HOSPITAL_BASED_OUTPATIENT_CLINIC_OR_DEPARTMENT_OTHER): Payer: PPO | Admitting: Hematology

## 2019-04-04 VITALS — BP 154/64 | HR 71 | Temp 98.3°F | Resp 18

## 2019-04-04 VITALS — BP 101/63 | HR 77 | Temp 97.8°F | Resp 18 | Wt 106.9 lb

## 2019-04-04 DIAGNOSIS — Z5111 Encounter for antineoplastic chemotherapy: Secondary | ICD-10-CM | POA: Diagnosis not present

## 2019-04-04 DIAGNOSIS — Z803 Family history of malignant neoplasm of breast: Secondary | ICD-10-CM

## 2019-04-04 DIAGNOSIS — C251 Malignant neoplasm of body of pancreas: Secondary | ICD-10-CM

## 2019-04-04 DIAGNOSIS — Z800 Family history of malignant neoplasm of digestive organs: Secondary | ICD-10-CM

## 2019-04-04 DIAGNOSIS — F17210 Nicotine dependence, cigarettes, uncomplicated: Secondary | ICD-10-CM

## 2019-04-04 DIAGNOSIS — C787 Secondary malignant neoplasm of liver and intrahepatic bile duct: Secondary | ICD-10-CM | POA: Diagnosis not present

## 2019-04-04 DIAGNOSIS — Z7984 Long term (current) use of oral hypoglycemic drugs: Secondary | ICD-10-CM

## 2019-04-04 DIAGNOSIS — E119 Type 2 diabetes mellitus without complications: Secondary | ICD-10-CM

## 2019-04-04 DIAGNOSIS — Z79899 Other long term (current) drug therapy: Secondary | ICD-10-CM

## 2019-04-04 DIAGNOSIS — F329 Major depressive disorder, single episode, unspecified: Secondary | ICD-10-CM | POA: Diagnosis not present

## 2019-04-04 DIAGNOSIS — Z853 Personal history of malignant neoplasm of breast: Secondary | ICD-10-CM

## 2019-04-04 LAB — COMPREHENSIVE METABOLIC PANEL
ALT: 15 U/L (ref 0–44)
AST: 20 U/L (ref 15–41)
Albumin: 2.9 g/dL — ABNORMAL LOW (ref 3.5–5.0)
Alkaline Phosphatase: 82 U/L (ref 38–126)
Anion gap: 13 (ref 5–15)
BUN: 13 mg/dL (ref 8–23)
CO2: 23 mmol/L (ref 22–32)
Calcium: 9 mg/dL (ref 8.9–10.3)
Chloride: 98 mmol/L (ref 98–111)
Creatinine, Ser: 0.75 mg/dL (ref 0.44–1.00)
GFR calc Af Amer: >60 mL/min (ref >60)
GFR calc non Af Amer: >60 mL/min (ref >60)
Glucose, Bld: 205 mg/dL — ABNORMAL HIGH (ref 70–99)
Potassium: 4.1 mmol/L (ref 3.5–5.1)
Sodium: 134 mmol/L — ABNORMAL LOW (ref 135–145)
Total Bilirubin: 0.6 mg/dL (ref 0.3–1.2)
Total Protein: 5.9 g/dL — ABNORMAL LOW (ref 6.5–8.1)

## 2019-04-04 LAB — CBC WITH DIFFERENTIAL/PLATELET
Abs Immature Granulocytes: 0.06 10*3/uL (ref 0.00–0.07)
Basophils Absolute: 0.1 10*3/uL (ref 0.0–0.1)
Basophils Relative: 1 %
Eosinophils Absolute: 0.2 10*3/uL (ref 0.0–0.5)
Eosinophils Relative: 2 %
HCT: 36.5 % (ref 36.0–46.0)
Hemoglobin: 11.5 g/dL — ABNORMAL LOW (ref 12.0–15.0)
Immature Granulocytes: 1 %
Lymphocytes Relative: 28 %
Lymphs Abs: 2.3 10*3/uL (ref 0.7–4.0)
MCH: 29.4 pg (ref 26.0–34.0)
MCHC: 31.5 g/dL (ref 30.0–36.0)
MCV: 93.4 fL (ref 80.0–100.0)
Monocytes Absolute: 0.8 10*3/uL (ref 0.1–1.0)
Monocytes Relative: 9 %
Neutro Abs: 5 10*3/uL (ref 1.7–7.7)
Neutrophils Relative %: 59 %
Platelets: 377 10*3/uL (ref 150–400)
RBC: 3.91 MIL/uL (ref 3.87–5.11)
RDW: 17.2 % — ABNORMAL HIGH (ref 11.5–15.5)
WBC: 8.3 10*3/uL (ref 4.0–10.5)
nRBC: 0 % (ref 0.0–0.2)

## 2019-04-04 MED ORDER — SODIUM CHLORIDE 0.9 % IV SOLN
105.0000 mg/m2 | Freq: Once | INTRAVENOUS | Status: AC
  Administered 2019-04-04: 160 mg via INTRAVENOUS
  Filled 2019-04-04: qty 4

## 2019-04-04 MED ORDER — SODIUM CHLORIDE 0.9 % IV SOLN
10.0000 mg | Freq: Once | INTRAVENOUS | Status: AC
  Administered 2019-04-04: 10 mg via INTRAVENOUS
  Filled 2019-04-04: qty 10

## 2019-04-04 MED ORDER — ATROPINE SULFATE 1 MG/ML IJ SOLN
0.5000 mg | Freq: Once | INTRAMUSCULAR | Status: AC | PRN
  Administered 2019-04-04: 0.5 mg via INTRAVENOUS
  Filled 2019-04-04: qty 1

## 2019-04-04 MED ORDER — MIRTAZAPINE 7.5 MG PO TABS: 7.5000 mg | ORAL_TABLET | Freq: Every day | ORAL | 0 refills | Status: DC

## 2019-04-04 MED ORDER — OXALIPLATIN CHEMO INJECTION 100 MG/20ML
59.5000 mg/m2 | Freq: Once | INTRAVENOUS | Status: AC
  Administered 2019-04-04: 90 mg via INTRAVENOUS
  Filled 2019-04-04: qty 18

## 2019-04-04 MED ORDER — SODIUM CHLORIDE 0.9 % IV SOLN
1680.0000 mg/m2 | INTRAVENOUS | Status: DC
  Administered 2019-04-04: 2600 mg via INTRAVENOUS
  Filled 2019-04-04: qty 52

## 2019-04-04 MED ORDER — MIRTAZAPINE 15 MG PO TABS: 15.0000 mg | ORAL_TABLET | Freq: Every day | ORAL | 0 refills | Status: DC

## 2019-04-04 MED ORDER — PALONOSETRON HCL INJECTION 0.25 MG/5ML
0.2500 mg | Freq: Once | INTRAVENOUS | Status: AC
  Administered 2019-04-04: 11:00:00 0.25 mg via INTRAVENOUS
  Filled 2019-04-04: qty 5

## 2019-04-04 MED ORDER — SODIUM CHLORIDE 0.9 % IV SOLN
280.0000 mg/m2 | Freq: Once | INTRAVENOUS | Status: AC
  Administered 2019-04-04: 434 mg via INTRAVENOUS
  Filled 2019-04-04: qty 21.7

## 2019-04-04 MED ORDER — DEXTROSE 5 % IV SOLN
Freq: Once | INTRAVENOUS | Status: AC
  Administered 2019-04-04: 11:00:00 via INTRAVENOUS

## 2019-04-04 MED ORDER — OXYCODONE-ACETAMINOPHEN 5-325 MG PO TABS: 1.0000 | ORAL_TABLET | Freq: Four times a day (QID) | ORAL | 0 refills | Status: DC | PRN

## 2019-04-04 NOTE — Progress Notes (Signed)
Kristine Rodriguez, Kristine Rodriguez [id number]   CLINIC:  Medical Oncology/Hematology  PCP:  Kristine Rodriguez, [place] [address] [phone]   REASON FOR VISIT: Follow-up for metastatic pancreatic cancer   BRIEF ONCOLOGIC HISTORY:  Oncology History  Cancer of pancreas, body ([place])  02/09/2019 Initial Diagnosis   Cancer of pancreas, body ([place])   02/21/2019 -  Chemotherapy   The patient had palonosetron (ALOXI) injection 0.25 mg, 0.25 mg, Intravenous,  Once, 3 of 4 cycles Administration: 0.25 mg (02/21/2019), 0.25 mg (03/09/2019), 0.25 mg (04/04/2019) pegfilgrastim (NEULASTA) injection 6 mg, 6 mg, Subcutaneous, Once, 1 of 2 cycles irinotecan (CAMPTOSAR) 240 mg in sodium chloride 0.9 % 500 mL chemo infusion, 150 mg/m2 = 240 mg (100 % of original dose 150 mg/m2), Intravenous,  Once, 2 of 3 cycles Dose modification: 150 mg/m2 (original dose 150 mg/m2, Cycle 2, Reason: Provider Judgment), 105 mg/m2 (70 % of original dose 150 mg/m2, Cycle 3, Reason: Other (see comments), Comment: weight loss) Administration: 240 mg (03/09/2019), 160 mg (04/04/2019) leucovorin 620 mg in dextrose 5 % 250 mL infusion, 400 mg/m2 = 620 mg, Intravenous,  Once, 3 of 4 cycles Dose modification: 280 mg/m2 (70 % of original dose 400 mg/m2, Cycle 3, Reason: Other (see comments), Comment: weight loss) Administration: 620 mg (02/21/2019), 620 mg (03/09/2019), 434 mg (04/04/2019) oxaliplatin (ELOXATIN) 130 mg in dextrose 5 % 500 mL chemo infusion, 85 mg/m2 = 130 mg, Intravenous,  Once, 3 of 4 cycles Dose modification: 59.5 mg/m2 (70 % of original dose 85 mg/m2, Cycle 3, Reason: Other (see comments), Comment: weight loss) Administration: 130 mg (02/21/2019), 130 mg (03/09/2019), 90 mg (04/04/2019) fluorouracil (ADRUCIL) 3,700 mg in sodium chloride 0.9 % 76 mL chemo infusion, 2,400 mg/m2 = 3,700 mg, Intravenous, 1 Day/Dose, 3 of 4 cycles Dose modification: 1,680 mg/m2 (70 % of original  dose 2,400 mg/m2, Cycle 3, Reason: Other (see comments), Comment: weight loss) Administration: 3,700 mg (02/21/2019), 3,700 mg (03/09/2019), 2,600 mg (04/04/2019)  for chemotherapy treatment.    03/02/2019 Genetic Testing   Negative genetic testing on the common hereditary cancer panel+pancreatic cancer panel+preliminary evidence genes for pancreatic cancer+chronic pancratitis genes.  This Gene Panel offered by [facility] includes sequencing and/or deletion duplication testing of the following 55 genes: APC, ATM, AXIN2, BARD1, BMPR1A, BRCA1, BRCA2, BRIP1, CASR, CDH1, CDK4, CDKN2A (p14ARF), CDKN2A (p16INK4a), CFTR, CHEK2, CPA1, CTNNA1, CTRC, DICER1, EPCAM (Deletion/duplication testing only), FANCC, GREM1 (promoter region deletion/duplication testing only), KIT, MEN1, MLH1, MSH2, MSH3, MSH6, MUTYH, NBN, NF1, NHTL1, PALB2, PALLD, PDGFRA, PMS2, POLD1, POLE,PRSS1, PTEN, RAD50, RAD51C, RAD51D, SDHB, SDHC, SDHD, SMAD4, SMARCA4. SPINK1, STK11, TP53, TSC1, TSC2, and VHL.  The following genes were evaluated for sequence changes only: SDHA and HOXB13 c.251G>A variant only. The report date is Mar 02, 2019.     INTERVAL HISTORY:  Kristine Rodriguez 73 y.o. female returns for routine follow-up for metastatic pancreatic cancer. She reports her appetite is decreased. She has lost another 5 pounds this visit. She will increase the ensure that she is drinking. She is fatigued since starting treatment. Denies any nausea, vomiting, or diarrhea. Denies any new pains. Had not noticed any recent bleeding such as epistaxis, hematuria or hematochezia. Denies recent chest pain on exertion, shortness of breath on minimal exertion, pre-syncopal episodes, or palpitations. Denies any numbness or tingling in hands or feet. Denies any recent fevers, infections, or recent hospitalizations. Patient reports appetite at 0% and energy level at 0%. We will set her up  an appointment with our dietitian. She is drinking the ensure but not the ensure plus. WE  have told her to increase the cans to 3 times per day and drink the plus.       REVIEW OF SYSTEMS:  Review of Systems  Constitutional: Positive for appetite change and fatigue.  Gastrointestinal: Positive for nausea and vomiting.  Neurological: Positive for dizziness.  All other systems reviewed and are negative.    PAST MEDICAL/SURGICAL HISTORY:  Past Medical History:  Diagnosis Date  . Cancer ([place])    hx of breast cancer left  . Diabetes mellitus   . Family history of breast cancer   . Family history of pancreatic cancer   . Hypertension   . Tobacco use 02/04/2019   Past Surgical History:  Procedure Laterality Date  . bladder tack    . COLONOSCOPY  06/04/2012   Procedure: COLONOSCOPY;  Surgeon: Kristine So, MD;  Location[facility];  Service: Gastroenterology;  Laterality: N/A;  . ESOPHAGOGASTRODUODENOSCOPY  06/04/2012   Procedure: ESOPHAGOGASTRODUODENOSCOPY (EGD);  Surgeon: Kristine So, MD;  Location: [facility];  Service: Gastroenterology;  Laterality: N/A;  . HERNIA REPAIR    . IR IMAGING GUIDED PORT INSERTION  02/18/2019  . IR US GUIDE BX ASP/DRAIN  02/07/2019  . left mastectomy    . TONSILLECTOMY    . VESICO-VAGINAL FISTULA REPAIR       SOCIAL HISTORY:  Social History   Socioeconomic History  . Marital status: Divorced    Spouse name: Not on file  . Number of children: 3  . Years of education: Not on file  . Highest education level: Not on file  Occupational History  . Occupation: retired  Scientific laboratory technician  . Financial resource strain: Not hard at all  . Food insecurity    Worry: Never true    Inability: Never true  . Transportation needs    Medical: No    Non-medical: No  Tobacco Use  . Smoking status: Current Every Day Smoker    Packs/day: 0.50    Types: Cigarettes  . Smokeless tobacco: Never Used  Substance and Sexual Activity  . Alcohol use: Yes    Comment: socially drink  . Drug use: No  . Sexual activity: Yes  Lifestyle  .  Physical activity    Days per week: 0 days    Minutes per session: 0 min  . Stress: Only a little  Relationships  . Social Herbalist on phone: Twice a week    Gets together: Twice a week    Attends religious service: Never    Active member of club or organization: No    Attends meetings of clubs or organizations: Never    Relationship status: Not on file  . Intimate partner violence    Fear of current or ex partner: No    Emotionally abused: No    Physically abused: No    Forced sexual activity: No  Other Topics Concern  . Not on file  Social History Narrative  . Not on file    FAMILY HISTORY:  Family History  Problem Relation Age of Onset  . Hypertension Mother   . Pancreatic cancer Mother 72  . Diabetes Father   . Hypertension Father   . Liver cancer Father   . Pancreatitis Sister   . Breast cancer Sister 60  . Aortic dissection Sister   . Aortic dissection Brother   . Liver cancer Paternal Grandfather  CURRENT MEDICATIONS:  Outpatient Encounter Medications as of 04/04/2019  Medication Sig Note  . albuterol (VENTOLIN HFA) 108 (90 Base) MCG/ACT inhaler Inhale 2 puffs into the lungs every 4 (four) hours as needed for wheezing or shortness of breath.   . dronabinol (MARINOL) 5 MG capsule Take 1 capsule (5 mg total) by mouth 2 (two) times daily before a meal.   . glimepiride (AMARYL) 1 MG tablet Take 1 tablet (1 mg total) by mouth daily as needed. Take only if blood sugars consistently above 140's   . glucose blood (ONE TOUCH ULTRA TEST) test strip TEST TWICE DAILY   . lidocaine (LIDODERM) 5 % Place 1 patch onto the skin daily. Remove & Discard patch within 12 hours or as directed by MD 02/21/2019: Applies to her back   . lidocaine-prilocaine (EMLA) cream Apply 1 application topically as needed. 1 hour prior to stick and cover with plastic wrap   . oxyCODONE-acetaminophen (PERCOCET/ROXICET) 5-325 MG tablet Take 1-2 tablets by mouth every 6 (six) hours as  needed for moderate pain or severe pain.   Marland Kitchen prochlorperazine (COMPAZINE) 10 MG tablet TAKE 1 TABLET(10 MG) BY MOUTH EVERY 6 HOURS AS NEEDED FOR NAUSEA OR VOMITING   . [DISCONTINUED] oxyCODONE-acetaminophen (PERCOCET/ROXICET) 5-325 MG tablet Take 1-2 tablets by mouth every 6 (six) hours as needed for moderate pain or severe pain.   . B-D ULTRAFINE III SHORT PEN 31G X 8 MM MISC USE ONCE AT BEDTIME (Patient not taking: Reported on 04/04/2019) 02/21/2019: Not taking  . mirtazapine (REMERON) 15 MG tablet Take 1 tablet (15 mg total) by mouth at bedtime.   . Multiple Vitamin (MULTIVITAMIN WITH MINERALS) TABS tablet Take 1 tablet by mouth daily. (Patient not taking: Reported on 03/23/2019)   . [DISCONTINUED] mirtazapine (REMERON) 7.5 MG tablet Take 1 tablet (7.5 mg total) by mouth at bedtime.    No facility-administered encounter medications on file as of 04/04/2019.     ALLERGIES:  Allergies  Allergen Reactions  . Duricef [Cefadroxil] Shortness Of Breath and Swelling  . Tape Rash     PHYSICAL EXAM:  ECOG Performance status: 1  Vitals:   04/04/19 0827  BP: 101/63  Pulse: 77  Resp: 18  Temp: 97.8 F (36.6 C)  SpO2: 98%   Filed Weights   04/04/19 0827  Weight: 106 lb 14.4 oz (48.5 kg)    Physical Exam Constitutional:      Appearance: Normal appearance. She is normal weight.  Cardiovascular:     Rate and Rhythm: Normal rate and regular rhythm.     Heart sounds: Normal heart sounds.  Pulmonary:     Effort: Pulmonary effort is normal.     Breath sounds: Normal breath sounds.  Abdominal:     General: Bowel sounds are normal.     Palpations: Abdomen is soft.  Musculoskeletal: Normal range of motion.  Skin:    General: Skin is warm and dry.  Neurological:     Mental Status: She is alert and oriented to person, place, and time. Mental status is at baseline.  Psychiatric:        Mood and Affect: Mood normal.        Behavior: Behavior normal.        Thought Content: Thought content  normal.        Judgment: Judgment normal.      LABORATORY DATA:  I have reviewed the labs as listed.  CBC    Component Value Date/Time   WBC 8.3 04/04/2019 0807  RBC 3.91 04/04/2019 0807   HGB 11.5 (L) 04/04/2019 0807   HGB 10.5 (L) 02/21/2019 1153   HCT 36.5 04/04/2019 0807   PLT 377 04/04/2019 0807   PLT 345 02/21/2019 1153   MCV 93.4 04/04/2019 0807   MCH 29.4 04/04/2019 0807   MCHC 31.5 04/04/2019 0807   RDW 17.2 (H) 04/04/2019 0807   LYMPHSABS 2.3 04/04/2019 0807   MONOABS 0.8 04/04/2019 0807   EOSABS 0.2 04/04/2019 0807   BASOSABS 0.1 04/04/2019 0807   CMP Latest Ref Rng & Units 04/04/2019 03/23/2019 03/08/2019  Glucose 70 - 99 mg/dL 205(H) 209(H) 208(H)  BUN 8 - 23 mg/dL 13 16 13   Creatinine 0.44 - 1.00 mg/dL 0.75 0.80 0.70  Sodium 135 - 145 mmol/L 134(L) 136 136  Potassium 3.5 - 5.1 mmol/L 4.1 3.8 3.5  Chloride 98 - 111 mmol/L 98 99 103  CO2 22 - 32 mmol/L 23 21(L) 21(L)  Calcium 8.9 - 10.3 mg/dL 9.0 8.6(L) 8.8(L)  Total Protein 6.5 - 8.1 g/dL 5.9(L) 5.8(L) 6.3(L)  Total Bilirubin 0.3 - 1.2 mg/dL 0.6 0.4 0.4  Alkaline Phos 38 - 126 U/L 82 79 88  AST 15 - 41 U/L 20 12(L) 15  ALT 0 - 44 U/L 15 12 12        DIAGNOSTIC IMAGING:  I have independently reviewed the scans and discussed with the patient.   I have reviewed Francene Finders, NP's note and agree with the documentation.  I personally performed a face-to-face visit, made revisions and my assessment and plan is as follows.    ASSESSMENT & PLAN:   Cancer of pancreas, body ([place]) 1.  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 -She reports 50 pound weight loss in the last 1 year, 16  pounds since December 2019.  She lives by herself at home. - Patient was evaluated by Dr. BJulieanne Mansonand received cycle 1 of FOLFOX on 02/21/2019. -She did experience slight diarrhea with it.  She also had some cold sensitivity.  She was counseled to drink and eat everything at room temperature for the first week. - CT chest performed on 03/04/2019 revealed known 6.1 cm pancreatic tail/body mass, hepatic mets, as well as pulmonary nodules.  Left leg Doppler did not show any DVT. -Cycle 2 of FOLFIRINOX on 03/09/2019. -Cycle 3 was held on 03/23/2019 secondary to neutropenia. - Today her blood count is adequate to proceed with therapy.  However she is feeling very weak and she lost about 3 pounds from last visit.  She is not eating much because of lack of appetite. -She is also slightly depressed.  We will start her on mirtazapine which will help with depression as well as appetite. - I will cut back on the dose of chemotherapy by 30%. -We will reevaluate her in 2 weeks.   2.  Abdominal and back pain: -She is taking Percocet 5 mg up to 4 tablets/day.  We have given her a refill.  3.  History of breast cancer: -She had left breast cancer at age [age] status post lumpectomy, chemotherapy, and radiation therapy followed by tamoxifen. 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  4.  Family history: -Mother  had pancreatic cancer.  Father had liver cancer. - Paternal grand father had liver cancer.  Sister had breast cancer. -Genetic testing on 02/24/2019 was negative.  5.  Weight loss: -She lost about 9 pounds in the last 1 month.  She is taking Marinol 2.5 mg twice daily.  However it is not helping.  At last visit I told her to increase it to 5 mg twice daily which she did not. -She is drinking 1 can of Ensure per day.  She is not eating much of any solid food. -I have told her to change to Ensure Plus and increase to 3 cans/day. -  She will increase Marinol to 5 mg twice daily. -I will also start her on mirtazapine 15 mg at bedtime.   Total time spent is 25 minutes with more than 50% of the time spent face-to-face discussing treatment plan, counseling and coordination of care.  Orders placed this encounter:  Orders Placed This Encounter  Procedures  . Cancer antigen 19-9  . CBC with Differential/Platelet  . Comprehensive metabolic panel      Derek Jack, MD [place] [phone]

## 2019-04-04 NOTE — Assessment & Plan Note (Signed)
1.  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 -She reports 50 pound weight loss in the last 1 year, 16 pounds since December 2019.  She lives by herself at home. - Patient was evaluated by Dr. BJulieanne Mansonand received cycle 1 of FOLFOX on 02/21/2019. -She did experience slight diarrhea with it.  She also had some cold sensitivity.  She was counseled to drink and eat everything at room temperature for the first week. - CT chest performed on 03/04/2019 revealed known 6.1 cm pancreatic tail/body mass, hepatic mets, as well as pulmonary nodules.  Left leg Doppler did not show any DVT. -Cycle 2 of FOLFIRINOX on 03/09/2019. -Cycle 3 was held on 03/23/2019 secondary to neutropenia. - Today her blood count is adequate to proceed with therapy.  However she is feeling very weak and she lost about 3 pounds from last visit.  She is not eating much because of lack of appetite. -She is also slightly depressed.  We will start her on mirtazapine which will help with depression as well as appetite. - I will cut back on the dose of chemotherapy by 30%. -We will reevaluate her in 2 weeks.   2.  Abdominal and back pain: -She is taking Percocet 5 mg up to 4 tablets/day.  We have given her a refill.  3.  History of breast cancer: -She had left breast cancer at age 73 status post lumpectomy, chemotherapy, and radiation therapy followed by tamoxifen. -She had recurrent left breast cancer at age 89 underwent mastectomy and did not receive anti-estrogen therapy.  4.  Family history: -Mother had pancreatic cancer.  Father had liver cancer. - Paternal grand father had liver cancer.  Sister had  breast cancer. -Genetic testing on 02/24/2019 was negative.  5.  Weight loss: -She lost about 9 pounds in the last 1 month.  She is taking Marinol 2.5 mg twice daily.  However it is not helping.  At last visit I told her to increase it to 5 mg twice daily which she did not. -She is drinking 1 can of Ensure per day.  She is not eating much of any solid food. -I have told her to change to Ensure Plus and increase to 3 cans/day. - She will increase Marinol to 5 mg twice daily. -I will also start her on mirtazapine 15 mg at bedtime.

## 2019-04-04 NOTE — Patient Instructions (Signed)
[facility] at [facility] Discharge Instructions   Follow up in 2 weeks with labs and treatment  Thank you for choosing [facility] at [facility] to provide your oncology and hematology care.  To afford each patient quality time with our provider, please arrive at least 15 minutes before your scheduled appointment time.   If you have a lab appointment with the Cancer Center please come in thru the  Main Entrance and check in at the main information desk  You need to re-schedule your appointment should you arrive 10 or more minutes late.  We strive to give you quality time with our providers, and arriving late affects you and other patients whose appointments are after yours.  Also, if you no show three or more times for appointments you may be dismissed from the clinic at the providers discretion.     Again, thank you for choosing [facility].  Our hope is that these requests will decrease the amount of time that you wait before being seen by our physicians.       _____________________________________________________________  Should you have questions after your visit to [facility], please contact our office at [phone] between the hours of 8:00 a.m. and 4:30 p.m.  Voicemails left after 4:00 p.m. will not be returned until the following business day.  For prescription refill requests, have your pharmacy contact our office and allow 72 hours.    Cancer Center Support Programs:   > Cancer Support Group  2nd Tuesday of the month 1pm-2pm, Journey Room    

## 2019-04-04 NOTE — Progress Notes (Signed)
Pt presents today for O/V with Dr. Delton Coombes. VSS. MAR reviewed. Pt requests pain medication refill. Labs pending.   Labs within parameters for treatment. VS within parameters for treatment.   Message received from [facility] NP. Proceed with tx. Dose reduction today. Henreitta Leber [facility] notified.  Treatment given today per MD orders. Tolerated infusion without adverse affects. Vital signs stable. 49fu pump infusing per protocol. Run noted on the pump. No complaints at this time. Discharged from clinic ambulatory. F/U with [facility] as scheduled.

## 2019-04-04 NOTE — Patient Instructions (Signed)
[facility] Discharge Instructions for Patients Receiving Chemotherapy  Today you received the following chemotherapy agents   To help prevent nausea and vomiting after your treatment, we encourage you to take your nausea medication   If you develop nausea and vomiting that is not controlled by your nausea medication, call the clinic.   BELOW ARE SYMPTOMS THAT SHOULD BE REPORTED IMMEDIATELY:  *FEVER GREATER THAN 100.5 F  *CHILLS WITH OR WITHOUT FEVER  NAUSEA AND VOMITING THAT IS NOT CONTROLLED WITH YOUR NAUSEA MEDICATION  *UNUSUAL SHORTNESS OF BREATH  *UNUSUAL BRUISING OR BLEEDING  TENDERNESS IN MOUTH AND THROAT WITH OR WITHOUT PRESENCE OF ULCERS  *URINARY PROBLEMS  *BOWEL PROBLEMS  UNUSUAL RASH Items with * indicate a potential emergency and should be followed up as soon as possible.  Feel free to call the clinic should you have any questions or concerns. The clinic phone number is [phone].  Please show the CHEMO ALERT CARD at check-in to the Emergency Department and triage nurse.   

## 2019-04-06 ENCOUNTER — Inpatient Hospital Stay (HOSPITAL_COMMUNITY): Payer: PPO

## 2019-04-06 ENCOUNTER — Ambulatory Visit (HOSPITAL_COMMUNITY): Payer: PPO

## 2019-04-06 ENCOUNTER — Other Ambulatory Visit: Payer: Self-pay

## 2019-04-06 ENCOUNTER — Ambulatory Visit (HOSPITAL_COMMUNITY): Payer: PPO | Admitting: Hematology

## 2019-04-06 ENCOUNTER — Other Ambulatory Visit (HOSPITAL_COMMUNITY): Payer: PPO

## 2019-04-06 VITALS — BP 150/71 | HR 75 | Temp 97.7°F | Resp 16

## 2019-04-06 DIAGNOSIS — Z5111 Encounter for antineoplastic chemotherapy: Secondary | ICD-10-CM | POA: Diagnosis not present

## 2019-04-06 DIAGNOSIS — C251 Malignant neoplasm of body of pancreas: Secondary | ICD-10-CM

## 2019-04-06 MED ORDER — PROCHLORPERAZINE MALEATE 10 MG PO TABS
ORAL_TABLET | ORAL | Status: AC
  Filled 2019-04-06: qty 1

## 2019-04-06 MED ORDER — HEPARIN SOD (PORK) LOCK FLUSH 100 UNIT/ML IV SOLN
500.0000 [IU] | Freq: Once | INTRAVENOUS | Status: AC | PRN
  Administered 2019-04-06: 500 [IU]

## 2019-04-06 MED ORDER — SODIUM CHLORIDE 0.9 % IV SOLN
INTRAVENOUS | Status: DC
  Administered 2019-04-06: 15:00:00 via INTRAVENOUS

## 2019-04-06 MED ORDER — SODIUM CHLORIDE 0.9% FLUSH
10.0000 mL | INTRAVENOUS | Status: DC | PRN
  Administered 2019-04-06: 10 mL
  Filled 2019-04-06: qty 10

## 2019-04-06 MED ORDER — PROCHLORPERAZINE MALEATE 10 MG PO TABS
10.0000 mg | ORAL_TABLET | Freq: Once | ORAL | Status: AC
  Administered 2019-04-06: 10 mg via ORAL

## 2019-04-06 MED ORDER — PEGFILGRASTIM INJECTION 6 MG/0.6ML ~~LOC~~
6.0000 mg | PREFILLED_SYRINGE | Freq: Once | SUBCUTANEOUS | Status: AC
  Administered 2019-04-06: 6 mg via SUBCUTANEOUS

## 2019-04-06 NOTE — Patient Instructions (Signed)
[facility] at [facility]  Discharge Instructions:   _______________________________________________________________  Thank you for choosing [facility] at [facility] to provide your oncology and hematology care.  To afford each patient quality time with our providers, please arrive at least 15 minutes before your scheduled appointment.  You need to re-schedule your appointment if you arrive 10 or more minutes late.  We strive to give you quality time with our providers, and arriving late affects you and other patients whose appointments are after yours.  Also, if you no show three or more times for appointments you may be dismissed from the clinic.  Again, thank you for choosing [facility] at [facility]. Our hope is that these requests will allow you access to exceptional care and in a timely manner. _______________________________________________________________  If you have questions after your visit, please contact our office at [phone] between the hours of 8:30 a.m. and 5:00 p.m. Voicemails left after 4:30 p.m. will not be returned until the following business day. _______________________________________________________________  For prescription refill requests, have your pharmacy contact our office. _______________________________________________________________  Recommendations made by the consultant and any test results will be sent to your referring physician. _______________________________________________________________ 

## 2019-04-06 NOTE — Progress Notes (Signed)
Kristine Rodriguez came in today for pump d/c. HR is 112, patient stated she had only drank 1/2 ensure today. Notified MD and will give 500 ml NS bolus, patient also stated she was a little nauseated. Compazine given as well per order.   Neulasta injection given as ordered.   [name] returns today for port de access and flush after 46 hr continous infusion of 43fu. Tolerated infusion without problems. Portacath located right chest wall ,  flushed with 58ml NS and 500U/52ml Heparin. Left accessed for hydration fluids  Procedure without incident. Patient tolerated procedure well.    Also instructed her to take her nausea medication every 6 hours for a couple more days. Set patient up for hydration for two more days. Encouraged patient to drank more fluids as well.    Vitals stable and discharged home from clinic via wheelchair. Follow up as scheduled.

## 2019-04-07 ENCOUNTER — Encounter (HOSPITAL_COMMUNITY): Payer: Self-pay

## 2019-04-07 ENCOUNTER — Inpatient Hospital Stay (HOSPITAL_COMMUNITY): Payer: PPO

## 2019-04-07 VITALS — BP 146/65 | HR 85 | Temp 98.1°F | Resp 18

## 2019-04-07 DIAGNOSIS — C251 Malignant neoplasm of body of pancreas: Secondary | ICD-10-CM

## 2019-04-07 DIAGNOSIS — Z5111 Encounter for antineoplastic chemotherapy: Secondary | ICD-10-CM | POA: Diagnosis not present

## 2019-04-07 MED ORDER — SODIUM CHLORIDE 0.9% FLUSH
10.0000 mL | Freq: Once | INTRAVENOUS | Status: AC | PRN
  Administered 2019-04-07: 10 mL

## 2019-04-07 MED ORDER — SODIUM CHLORIDE 0.9 % IV SOLN
Freq: Once | INTRAVENOUS | Status: AC
  Administered 2019-04-07: 14:00:00 via INTRAVENOUS
  Filled 2019-04-07: qty 1000

## 2019-04-07 MED ORDER — HEPARIN SOD (PORK) LOCK FLUSH 100 UNIT/ML IV SOLN
500.0000 [IU] | Freq: Once | INTRAVENOUS | Status: AC | PRN
  Administered 2019-04-07: 16:00:00 500 [IU]

## 2019-04-07 NOTE — Progress Notes (Signed)
Delray Alt tolerated IV hydration with magnesium and potassium well without complaints or incident. Port left accessed and flushed per protocol for use tomorrow. VSS upon discharge. Pt discharged via wheelchair in satisfactory condition

## 2019-04-07 NOTE — Patient Instructions (Signed)
[facility] at [facility] Discharge Instructions Received IV hydration with potassium and magnesium today. Follow-up as scheduled. Call clinic for any questions or concerns   Thank you for choosing [facility] at [facility] to provide your oncology and hematology care.  To afford each patient quality time with our provider, please arrive at least 15 minutes before your scheduled appointment time.   If you have a lab appointment with the Cancer Center please come in thru the  Main Entrance and check in at the main information desk  You need to re-schedule your appointment should you arrive 10 or more minutes late.  We strive to give you quality time with our providers, and arriving late affects you and other patients whose appointments are after yours.  Also, if you no show three or more times for appointments you may be dismissed from the clinic at the providers discretion.     Again, thank you for choosing [facility].  Our hope is that these requests will decrease the amount of time that you wait before being seen by our physicians.       _____________________________________________________________  Should you have questions after your visit to [facility], please contact our office at [phone] between the hours of 8:00 a.m. and 4:30 p.m.  Voicemails left after 4:00 p.m. will not be returned until the following business day.  For prescription refill requests, have your pharmacy contact our office and allow 72 hours.    Cancer Center Support Programs:   > Cancer Support Group  2nd Tuesday of the month 1pm-2pm, Journey Room   

## 2019-04-08 ENCOUNTER — Encounter (HOSPITAL_COMMUNITY): Payer: Self-pay

## 2019-04-08 ENCOUNTER — Inpatient Hospital Stay (HOSPITAL_COMMUNITY): Payer: PPO

## 2019-04-08 ENCOUNTER — Ambulatory Visit (HOSPITAL_COMMUNITY): Payer: PPO

## 2019-04-08 ENCOUNTER — Other Ambulatory Visit: Payer: Self-pay

## 2019-04-08 ENCOUNTER — Encounter (HOSPITAL_COMMUNITY): Payer: PPO

## 2019-04-08 VITALS — BP 165/68 | HR 81 | Temp 98.5°F | Resp 18

## 2019-04-08 DIAGNOSIS — C251 Malignant neoplasm of body of pancreas: Secondary | ICD-10-CM

## 2019-04-08 DIAGNOSIS — Z5111 Encounter for antineoplastic chemotherapy: Secondary | ICD-10-CM | POA: Diagnosis not present

## 2019-04-08 MED ORDER — SODIUM CHLORIDE 0.9% FLUSH
10.0000 mL | Freq: Once | INTRAVENOUS | Status: AC | PRN
  Administered 2019-04-08: 10 mL

## 2019-04-08 MED ORDER — SODIUM CHLORIDE 0.9 % IV SOLN
Freq: Once | INTRAVENOUS | Status: AC
  Administered 2019-04-08: 10:00:00 via INTRAVENOUS
  Filled 2019-04-08: qty 1000

## 2019-04-08 MED ORDER — HEPARIN SOD (PORK) LOCK FLUSH 100 UNIT/ML IV SOLN
500.0000 [IU] | Freq: Once | INTRAVENOUS | Status: AC | PRN
  Administered 2019-04-08: 12:00:00 500 [IU]

## 2019-04-08 NOTE — Progress Notes (Addendum)
Nutrition  RD working remotely.  Called patient for nutrition assessment times 3.  No answer and mailbox full and unable to leave message at this time.   Kristine Rodriguez, [place], [place] Registered Dietitian [phone] (pager)

## 2019-04-08 NOTE — Progress Notes (Signed)
Pt presents today for hydration only. VSS. Pt has no complaints of pain or nausea. Pt states,  I took Compazine last night and I don't have any problems with nausea now." Pt informed RN that constipation is an issue today and patient took Miralax x 1 dose last night. Pt states she has a hemorrhoid that is giving her some trouble. Pt states she ate beef stew last night and drank some fluids. Pt teaching pertaining to drinking water and eating. Spoke with RNester NP about constipation. Pt instructed to use over the counter Colace daily, and Miralax every 4 hours until BM achieved. Understanding verbalized by patient.   Fluids given today per MD orders. Tolerated infusion without adverse affects. Vital signs stable. No complaints at this time. Discharged from clinic via wheel chair. F/U with [facility] as scheduled.

## 2019-04-11 ENCOUNTER — Encounter: Payer: PPO | Admitting: Genetic Counselor

## 2019-04-16 ENCOUNTER — Other Ambulatory Visit: Payer: Self-pay | Admitting: Hematology

## 2019-04-18 ENCOUNTER — Inpatient Hospital Stay (HOSPITAL_BASED_OUTPATIENT_CLINIC_OR_DEPARTMENT_OTHER): Payer: PPO | Admitting: Hematology

## 2019-04-18 ENCOUNTER — Other Ambulatory Visit: Payer: Self-pay | Admitting: "Endocrinology

## 2019-04-18 ENCOUNTER — Inpatient Hospital Stay (HOSPITAL_COMMUNITY): Payer: PPO | Attending: Genetic Counselor

## 2019-04-18 ENCOUNTER — Inpatient Hospital Stay (HOSPITAL_COMMUNITY): Payer: PPO

## 2019-04-18 ENCOUNTER — Other Ambulatory Visit: Payer: Self-pay

## 2019-04-18 ENCOUNTER — Encounter (HOSPITAL_COMMUNITY): Payer: Self-pay | Admitting: Hematology

## 2019-04-18 VITALS — BP 141/70 | HR 116 | Temp 97.1°F | Resp 18 | Wt 102.8 lb

## 2019-04-18 DIAGNOSIS — Z853 Personal history of malignant neoplasm of breast: Secondary | ICD-10-CM | POA: Diagnosis not present

## 2019-04-18 DIAGNOSIS — Z923 Personal history of irradiation: Secondary | ICD-10-CM

## 2019-04-18 DIAGNOSIS — C251 Malignant neoplasm of body of pancreas: Secondary | ICD-10-CM | POA: Diagnosis not present

## 2019-04-18 DIAGNOSIS — G893 Neoplasm related pain (acute) (chronic): Secondary | ICD-10-CM

## 2019-04-18 DIAGNOSIS — Z9221 Personal history of antineoplastic chemotherapy: Secondary | ICD-10-CM | POA: Insufficient documentation

## 2019-04-18 DIAGNOSIS — R634 Abnormal weight loss: Secondary | ICD-10-CM | POA: Diagnosis not present

## 2019-04-18 DIAGNOSIS — C787 Secondary malignant neoplasm of liver and intrahepatic bile duct: Secondary | ICD-10-CM | POA: Diagnosis not present

## 2019-04-18 LAB — CBC WITH DIFFERENTIAL/PLATELET
Abs Immature Granulocytes: 0.97 10*3/uL — ABNORMAL HIGH (ref 0.00–0.07)
Basophils Absolute: 0.1 10*3/uL (ref 0.0–0.1)
Basophils Relative: 0 %
Eosinophils Absolute: 0.2 10*3/uL (ref 0.0–0.5)
Eosinophils Relative: 1 %
HCT: 36.6 % (ref 36.0–46.0)
Hemoglobin: 11.5 g/dL — ABNORMAL LOW (ref 12.0–15.0)
Immature Granulocytes: 5 %
Lymphocytes Relative: 10 %
Lymphs Abs: 1.7 10*3/uL (ref 0.7–4.0)
MCH: 29.9 pg (ref 26.0–34.0)
MCHC: 31.4 g/dL (ref 30.0–36.0)
MCV: 95.3 fL (ref 80.0–100.0)
Monocytes Absolute: 1.2 10*3/uL — ABNORMAL HIGH (ref 0.1–1.0)
Monocytes Relative: 7 %
Neutro Abs: 13.7 10*3/uL — ABNORMAL HIGH (ref 1.7–7.7)
Neutrophils Relative %: 77 %
Platelets: 282 10*3/uL (ref 150–400)
RBC: 3.84 MIL/uL — ABNORMAL LOW (ref 3.87–5.11)
RDW: 18.4 % — ABNORMAL HIGH (ref 11.5–15.5)
WBC: 17.9 10*3/uL — ABNORMAL HIGH (ref 4.0–10.5)
nRBC: 0.4 % — ABNORMAL HIGH (ref 0.0–0.2)

## 2019-04-18 LAB — COMPREHENSIVE METABOLIC PANEL
ALT: 16 U/L (ref 0–44)
AST: 17 U/L (ref 15–41)
Albumin: 2.9 g/dL — ABNORMAL LOW (ref 3.5–5.0)
Alkaline Phosphatase: 153 U/L — ABNORMAL HIGH (ref 38–126)
Anion gap: 14 (ref 5–15)
BUN: 14 mg/dL (ref 8–23)
CO2: 23 mmol/L (ref 22–32)
Calcium: 8.7 mg/dL — ABNORMAL LOW (ref 8.9–10.3)
Chloride: 100 mmol/L (ref 98–111)
Creatinine, Ser: 0.74 mg/dL (ref 0.44–1.00)
GFR calc Af Amer: >60 mL/min (ref >60)
GFR calc non Af Amer: >60 mL/min (ref >60)
Glucose, Bld: 211 mg/dL — ABNORMAL HIGH (ref 70–99)
Potassium: 3.5 mmol/L (ref 3.5–5.1)
Sodium: 137 mmol/L (ref 135–145)
Total Bilirubin: 0.6 mg/dL (ref 0.3–1.2)
Total Protein: 5.9 g/dL — ABNORMAL LOW (ref 6.5–8.1)

## 2019-04-18 MED ORDER — SODIUM CHLORIDE 0.9% FLUSH
10.0000 mL | INTRAVENOUS | Status: DC | PRN
  Administered 2019-04-18: 10 mL via INTRAVENOUS
  Filled 2019-04-18: qty 10

## 2019-04-18 MED ORDER — HEPARIN SOD (PORK) LOCK FLUSH 100 UNIT/ML IV SOLN
500.0000 [IU] | Freq: Once | INTRAVENOUS | Status: AC
  Administered 2019-04-18: 500 [IU] via INTRAVENOUS

## 2019-04-18 NOTE — Assessment & Plan Note (Addendum)
1.  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 -She reports 50 pound weight loss in the last 1 year, 16 pounds since December 2019.  She lives by herself at home. - Patient was evaluated by Dr. BJulieanne Mansonand received cycle 1 of FOLFOX on 02/21/2019. -She did experience slight diarrhea with it.  She also had some cold sensitivity.  She was counseled to drink and eat everything at room temperature for the first week. - CT chest performed on 03/04/2019 revealed known 6.1 cm pancreatic tail/body mass, hepatic mets, as well as pulmonary nodules.  Left leg Doppler did not show any DVT. -Cycle 2 of FOLFIRINOX on 03/09/2019.  Cycle 3 was on 04/04/2019 with 30% dose reduction. - Patient voices today that she no longer wants to proceed with chemotherapy.  At this time, patient would like to focus more on quality of life.  She states she has the support of her children.  She has opted for hospice care. - Have recommended to repeat CT chest abdomen pelvis to assess disease burden.  Depression has improved since the start of Remeron. -Patient will return in 1 week for further discussion regarding hospice.   2.  Abdominal and back pain: -She is taking Percocet 5 mg up to 4 tablets/day.  We have given her a refill.  3.  History of breast cancer: -She had left breast cancer at age 73 status post lumpectomy, chemotherapy, and radiation therapy followed by tamoxifen. 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  4.  Family history: -Mother had pancreatic cancer.  Father had liver cancer. - Paternal grand father had  liver cancer.  Sister had breast cancer. -Genetic testing on 02/24/2019 was negative.  5.  Weight loss: -She lost about 9 pounds in the last 1 month. -She is taking Marinol 5 mg twice daily which is helping. - Remeron at bedtime is also helping with appetite.

## 2019-04-18 NOTE — Progress Notes (Signed)
Kristine Rodriguez,  [id number]   CLINIC:  Medical Oncology/Hematology  PCP:  Sharilyn Sites, [place] [address] [phone]   REASON FOR VISIT: Follow-up for metastatic pancreatic cancer   BRIEF ONCOLOGIC HISTORY:  Oncology History  Cancer of pancreas, body ([place])  02/09/2019 Initial Diagnosis   Cancer of pancreas, body ([place])   02/21/2019 -  Chemotherapy   The patient had palonosetron (ALOXI) injection 0.25 mg, 0.25 mg, Intravenous,  Once, 3 of 4 cycles Administration: 0.25 mg (02/21/2019), 0.25 mg (03/09/2019), 0.25 mg (04/04/2019) pegfilgrastim (NEULASTA) injection 6 mg, 6 mg, Subcutaneous, Once, 1 of 2 cycles Administration: 6 mg (04/06/2019) irinotecan (CAMPTOSAR) 240 mg in sodium chloride 0.9 % 500 mL chemo infusion, 150 mg/m2 = 240 mg (100 % of original dose 150 mg/m2), Intravenous,  Once, 2 of 3 cycles Dose modification: 150 mg/m2 (original dose 150 mg/m2, Cycle 2, Reason: Provider Judgment), 105 mg/m2 (70 % of original dose 150 mg/m2, Cycle 3, Reason: Other (see comments), Comment: weight loss) Administration: 240 mg (03/09/2019), 160 mg (04/04/2019) leucovorin 620 mg in dextrose 5 % 250 mL infusion, 400 mg/m2 = 620 mg, Intravenous,  Once, 3 of 4 cycles Dose modification: 280 mg/m2 (70 % of original dose 400 mg/m2, Cycle 3, Reason: Other (see comments), Comment: weight loss) Administration: 620 mg (02/21/2019), 620 mg (03/09/2019), 434 mg (04/04/2019) oxaliplatin (ELOXATIN) 130 mg in dextrose 5 % 500 mL chemo infusion, 85 mg/m2 = 130 mg, Intravenous,  Once, 3 of 4 cycles Dose modification: 59.5 mg/m2 (70 % of original dose 85 mg/m2, Cycle 3, Reason: Other (see comments), Comment: weight loss) Administration: 130 mg (02/21/2019), 130 mg (03/09/2019), 90 mg (04/04/2019) fluorouracil (ADRUCIL) 3,700 mg in sodium chloride 0.9 % 76 mL chemo infusion, 2,400 mg/m2 = 3,700 mg, Intravenous, 1 Day/Dose, 3 of 4 cycles Dose  modification: 1,680 mg/m2 (70 % of original dose 2,400 mg/m2, Cycle 3, Reason: Other (see comments), Comment: weight loss) Administration: 3,700 mg (02/21/2019), 3,700 mg (03/09/2019), 2,600 mg (04/04/2019)  for chemotherapy treatment.    03/02/2019 Genetic Testing   Negative genetic testing on the common hereditary cancer panel+pancreatic cancer panel+preliminary evidence genes for pancreatic cancer+chronic pancratitis genes.  This Gene Panel offered by [facility] includes sequencing and/or deletion duplication testing of the following 55 genes: APC, ATM, AXIN2, BARD1, BMPR1A, BRCA1, BRCA2, BRIP1, CASR, CDH1, CDK4, CDKN2A (p14ARF), CDKN2A (p16INK4a), CFTR, CHEK2, CPA1, CTNNA1, CTRC, DICER1, EPCAM (Deletion/duplication testing only), FANCC, GREM1 (promoter region deletion/duplication testing only), KIT, MEN1, MLH1, MSH2, MSH3, MSH6, MUTYH, NBN, NF1, NHTL1, PALB2, PALLD, PDGFRA, PMS2, POLD1, POLE,PRSS1, PTEN, RAD50, RAD51C, RAD51D, SDHB, SDHC, SDHD, SMAD4, SMARCA4. SPINK1, STK11, TP53, TSC1, TSC2, and VHL.  The following genes were evaluated for sequence changes only: SDHA and HOXB13 c.251G>A variant only. The report date is Mar 02, 2019.     INTERVAL HISTORY:  Kristine Rodriguez 73 y.o. female seen for follow-up of metastatic pancreatic cancer.  Cycle 3 was on 04/04/2019.  She had diarrhea many times.  She also reported some incontinence.  She had minor nausea but no vomiting.  Fatigue is also prominent.  Appetite is 25%.  Energy levels are 25%.  Pain in the back on the right side is stable.  No fevers or night sweats.  Appetite reportedly picked up since she was started on mirtazapine and Marinol increased to 5 mg twice daily.  However she lost about 1 pound since last visit.    REVIEW OF SYSTEMS:  Review of Systems  Constitutional: Positive for fatigue.  Gastrointestinal: Positive for diarrhea and nausea.  Neurological: Positive for dizziness.  All other systems reviewed and are negative.    PAST  MEDICAL/SURGICAL HISTORY:  Past Medical History:  Diagnosis Date  . Cancer ([place])    hx of breast cancer left  . Diabetes mellitus   . Family history of breast cancer   . Family history of pancreatic cancer   . Hypertension   . Tobacco use 02/04/2019   Past Surgical History:  Procedure Laterality Date  . bladder tack    . COLONOSCOPY  06/04/2012   Procedure: COLONOSCOPY;  Surgeon: Jamesetta So, MD;  Location: [facility];  Service: Gastroenterology;  Laterality: N/A;  . ESOPHAGOGASTRODUODENOSCOPY  06/04/2012   Procedure: ESOPHAGOGASTRODUODENOSCOPY (EGD);  Surgeon: Jamesetta So, MD;  Location: [facility];  Service: Gastroenterology;  Laterality: N/A;  . HERNIA REPAIR    . IR IMAGING GUIDED PORT INSERTION  02/18/2019  . IR US GUIDE BX ASP/DRAIN  02/07/2019  . left mastectomy    . TONSILLECTOMY    . VESICO-VAGINAL FISTULA REPAIR       SOCIAL HISTORY:  Social History   Socioeconomic History  . Marital status: Divorced    Spouse name: Not on file  . Number of children: 3  . Years of education: Not on file  . Highest education level: Not on file  Occupational History  . Occupation: retired  Scientific laboratory technician  . Financial resource strain: Not hard at all  . Food insecurity    Worry: Never true    Inability: Never true  . Transportation needs    Medical: No    Non-medical: No  Tobacco Use  . Smoking status: Current Every Day Smoker    Packs/day: 0.50    Types: Cigarettes  . Smokeless tobacco: Never Used  Substance and Sexual Activity  . Alcohol use: Yes    Comment: socially drink  . Drug use: No  . Sexual activity: Yes  Lifestyle  . Physical activity    Days per week: 0 days    Minutes per session: 0 min  . Stress: Only a little  Relationships  . Social Herbalist on phone: Twice a week    Gets together: Twice a week    Attends religious service: Never    Active member of club or organization: No    Attends meetings of clubs or organizations: Never     Relationship status: Not on file  . Intimate partner violence    Fear of current or ex partner: No    Emotionally abused: No    Physically abused: No    Forced sexual activity: No  Other Topics Concern  . Not on file  Social History Narrative  . Not on file    FAMILY HISTORY:  Family History  Problem Relation Age of Onset  . Hypertension Mother   . Pancreatic cancer Mother 46  . Diabetes Father   . Hypertension Father   . Liver cancer Father   . Pancreatitis Sister   . Breast cancer Sister 68  . Aortic dissection Sister   . Aortic dissection Brother   . Liver cancer Paternal Grandfather     CURRENT MEDICATIONS:  Outpatient Encounter Medications as of 04/18/2019  Medication Sig Note  . albuterol (VENTOLIN HFA) 108 (90 Base) MCG/ACT inhaler Inhale 2 puffs into the lungs every 4 (four) hours as needed for wheezing or shortness of breath.   . B-D ULTRAFINE III  SHORT PEN 31G X 8 MM MISC USE ONCE AT BEDTIME 02/21/2019: Not taking  . dronabinol (MARINOL) 5 MG capsule Take 1 capsule (5 mg total) by mouth 2 (two) times daily before a meal.   . glimepiride (AMARYL) 1 MG tablet Take 1 tablet (1 mg total) by mouth daily as needed. Take only if blood sugars consistently above 140's   . lidocaine (LIDODERM) 5 % Place 1 patch onto the skin daily. Remove & Discard patch within 12 hours or as directed by MD (Patient not taking: Reported on 04/18/2019) 02/21/2019: Applies to her back   . lidocaine-prilocaine (EMLA) cream Apply 1 application topically as needed. 1 hour prior to stick and cover with plastic wrap   . mirtazapine (REMERON) 15 MG tablet Take 1 tablet (15 mg total) by mouth at bedtime.   . Multiple Vitamin (MULTIVITAMIN WITH MINERALS) TABS tablet Take 1 tablet by mouth daily. (Patient not taking: Reported on 04/18/2019)   . oxyCODONE-acetaminophen (PERCOCET/ROXICET) 5-325 MG tablet Take 1-2 tablets by mouth every 6 (six) hours as needed for moderate pain or severe pain.   Marland Kitchen prochlorperazine  (COMPAZINE) 10 MG tablet TAKE 1 TABLET(10 MG) BY MOUTH EVERY 6 HOURS AS NEEDED FOR NAUSEA OR VOMITING   . [DISCONTINUED] glucose blood (ONE TOUCH ULTRA TEST) test strip TEST TWICE DAILY    No facility-administered encounter medications on file as of 04/18/2019.     ALLERGIES:  Allergies  Allergen Reactions  . Duricef [Cefadroxil] Shortness Of Breath and Swelling  . Tape Rash     PHYSICAL EXAM:  ECOG Performance status: 1  Vitals:   04/18/19 0925 04/18/19 0933  BP: (!) 141/70 (!) 141/70  Pulse: (!) 116 (!) 116  Resp: 18 18  Temp: (!) 97.1 F (36.2 C) (!) 97.1 F (36.2 C)  SpO2: 99% 99%   Filed Weights   04/18/19 0925 04/18/19 0933  Weight: 102 lb 12.8 oz (46.6 kg) 102 lb 12.8 oz (46.6 kg)    Physical Exam Constitutional:      Appearance: Normal appearance. She is normal weight.  Cardiovascular:     Rate and Rhythm: Normal rate and regular rhythm.     Heart sounds: Normal heart sounds.  Pulmonary:     Effort: Pulmonary effort is normal.     Breath sounds: Normal breath sounds.  Abdominal:     General: Bowel sounds are normal.     Palpations: Abdomen is soft.  Musculoskeletal: Normal range of motion.  Skin:    General: Skin is warm and dry.  Neurological:     Mental Status: She is alert and oriented to person, place, and time. Mental status is at baseline.  Psychiatric:        Mood and Affect: Mood normal.        Behavior: Behavior normal.        Thought Content: Thought content normal.        Judgment: Judgment normal.      LABORATORY DATA:  I have reviewed the labs as listed.  CBC    Component Value Date/Time   WBC 17.9 (H) 04/18/2019 0851   RBC 3.84 (L) 04/18/2019 0851   HGB 11.5 (L) 04/18/2019 0851   HGB 10.5 (L) 02/21/2019 1153   HCT 36.6 04/18/2019 0851   PLT 282 04/18/2019 0851   PLT 345 02/21/2019 1153   MCV 95.3 04/18/2019 0851   MCH 29.9 04/18/2019 0851   MCHC 31.4 04/18/2019 0851   RDW 18.4 (H) 04/18/2019 0851   LYMPHSABS 1.7  04/18/2019  0851   MONOABS 1.2 (H) 04/18/2019 0851   EOSABS 0.2 04/18/2019 0851   BASOSABS 0.1 04/18/2019 0851   CMP Latest Ref Rng & Units 04/18/2019 04/04/2019 03/23/2019  Glucose 70 - 99 mg/dL 211(H) 205(H) 209(H)  BUN 8 - 23 mg/dL 14 13 16   Creatinine 0.44 - 1.00 mg/dL 0.74 0.75 0.80  Sodium 135 - 145 mmol/L 137 134(L) 136  Potassium 3.5 - 5.1 mmol/L 3.5 4.1 3.8  Chloride 98 - 111 mmol/L 100 98 99  CO2 22 - 32 mmol/L 23 23 21(L)  Calcium 8.9 - 10.3 mg/dL 8.7(L) 9.0 8.6(L)  Total Protein 6.5 - 8.1 g/dL 5.9(L) 5.9(L) 5.8(L)  Total Bilirubin 0.3 - 1.2 mg/dL 0.6 0.6 0.4  Alkaline Phos 38 - 126 U/L 153(H) 82 79  AST 15 - 41 U/L 17 20 12(L)  ALT 0 - 44 U/L 16 15 12      ASSESSMENT & PLAN:   Cancer of pancreas, body ([place]) 1.  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 -She reports 50 pound weight loss in the last 1 year, 16 pounds since December 2019.  She lives by herself at home. - Patient was evaluated by Dr. BJulieanne Mansonand received cycle 1 of FOLFOX on 02/21/2019. -She did experience slight diarrhea with it.  She also had some cold sensitivity.  She was counseled to drink and eat everything at room temperature for the first week. - CT chest performed on 03/04/2019 revealed known 6.1 cm pancreatic tail/body mass, hepatic mets, as well as pulmonary nodules.  Left leg Doppler did not show any DVT. -Cycle 2 of FOLFIRINOX on 03/09/2019. -Cycle 3 was held on 03/23/2019 secondary to neutropenia. - Plan to dose reduce chemotherapy by 30%. - Patient voices today that she no longer wants to proceed with chemotherapy.  At this time, patient would like to focus more on quality of  life.  She states she has the support of her children.  She has opted for hospice care. - Have recommended to repeat CT chest abdomen pelvis to assess disease burden.   -Patient will return in 1 week for further discussion regarding hospice.   2.  Abdominal and back pain: -She is taking Percocet 5 mg up to 4 tablets/day.  We have given her a refill.  3.  History of breast cancer: -She had left breast cancer at age 73 status post lumpectomy, chemotherapy, and radiation therapy followed by tamoxifen. 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  4.  Family history: -Mother had pancreatic cancer.  Father had liver cancer. - Paternal grand father had liver cancer.  Sister had breast cancer. -Genetic testing on 02/24/2019 was negative.  5.  Weight loss: -She lost about 9 pounds in the last 1 month.  She is taking Marinol 2.5 mg twice daily.  However it is not helping.  At last visit I told her to increase it to 5 mg twice daily which she did not. -She is drinking 1 can of Ensure per day.  She is not eating much of any solid food. -I have told her to change to Ensure Plus and increase to 3 cans/day. - She will increase Marinol  to 5 mg twice daily. -Started on mirtazapine 15 mg at bedtime.   Total time spent is 25 minutes with more than 50% of the time spent face-to-face discussing treatment plan, counseling and coordination of care.  Orders placed this encounter:  Orders Placed This Encounter  Procedures  . CT Chest W Contrast  . CT Abdomen Pelvis [address] [place], [place] [phone]

## 2019-04-18 NOTE — Patient Instructions (Addendum)
[facility] at [facility] Discharge Instructions  You were seen today by Dr. Katragadda. He went over your recent lab results. He will see you back in 1 week for follow up.   Thank you for choosing [facility] at [facility] to provide your oncology and hematology care.  To afford each patient quality time with our provider, please arrive at least 15 minutes before your scheduled appointment time.   If you have a lab appointment with the Cancer Center please come in thru the  Main Entrance and check in at the main information desk  You need to re-schedule your appointment should you arrive 10 or more minutes late.  We strive to give you quality time with our providers, and arriving late affects you and other patients whose appointments are after yours.  Also, if you no show three or more times for appointments you may be dismissed from the clinic at the providers discretion.     Again, thank you for choosing [facility].  Our hope is that these requests will decrease the amount of time that you wait before being seen by our physicians.       _____________________________________________________________  Should you have questions after your visit to [facility], please contact our office at [phone] between the hours of 8:00 a.m. and 4:30 p.m.  Voicemails left after 4:00 p.m. will not be returned until the following business day.  For prescription refill requests, have your pharmacy contact our office and allow 72 hours.    Cancer Center Support Programs:   > Cancer Support Group  2nd Tuesday of the month 1pm-2pm, Journey Room    

## 2019-04-18 NOTE — Progress Notes (Signed)
1100 Labs reviewed with and pt seen by Dr. Delton Coombes and chemo tx to be held today per MD. Pt discharged via wheelchair in satisfactory condition

## 2019-04-18 NOTE — Patient Instructions (Signed)
[facility] at [facility] Discharge Instructions  Labs drawn from [facility]acath today   Thank you for choosing [facility] at [facility] to provide your oncology and hematology care.  To afford each patient quality time with our provider, please arrive at least 15 minutes before your scheduled appointment time.   If you have a lab appointment with the Cancer Center please come in thru the  Main Entrance and check in at the main information desk  You need to re-schedule your appointment should you arrive 10 or more minutes late.  We strive to give you quality time with our providers, and arriving late affects you and other patients whose appointments are after yours.  Also, if you no show three or more times for appointments you may be dismissed from the clinic at the providers discretion.     Again, thank you for choosing [facility].  Our hope is that these requests will decrease the amount of time that you wait before being seen by our physicians.       _____________________________________________________________  Should you have questions after your visit to [facility], please contact our office at [phone] between the hours of 8:00 a.m. and 4:30 p.m.  Voicemails left after 4:00 p.m. will not be returned until the following business day.  For prescription refill requests, have your pharmacy contact our office and allow 72 hours.    Cancer Center Support Programs:   > Cancer Support Group  2nd Tuesday of the month 1pm-2pm, Journey Room   

## 2019-04-18 NOTE — Patient Instructions (Signed)
[place] at [facility] Discharge Instructions  Chemo tx held today Follow-up as scheduled. Call clinic for any questions or concerns   Thank you for choosing [place] at [facility] to provide your oncology and hematology care.  To afford each patient quality time with our provider, please arrive at least 15 minutes before your scheduled appointment time.   If you have a lab appointment with the [place] please come in thru the  Main Entrance and check in at the main information desk  You need to re-schedule your appointment should you arrive 10 or more minutes late.  We strive to give you quality time with our providers, and arriving late affects you and other patients whose appointments are after yours.  Also, if you no show three or more times for appointments you may be dismissed from the clinic at the providers discretion.     Again, thank you for choosing [facility].  Our hope is that these requests will decrease the amount of time that you wait before being seen by our physicians.       _____________________________________________________________  Should you have questions after your visit to [facility], please contact our office at [phone] between the hours of 8:00 a.m. and 4:30 p.m.  Voicemails left after 4:00 p.m. will not be returned until the following business day.  For prescription refill requests, have your pharmacy contact our office and allow 72 hours.    Cancer Center Support Programs:   > Cancer Support Group  2nd Tuesday of the month 1pm-2pm, Journey Room

## 2019-04-19 LAB — CANCER ANTIGEN 19-9: CA 19-9: 18851 U/mL — ABNORMAL HIGH (ref 0–35)

## 2019-04-20 ENCOUNTER — Encounter (HOSPITAL_COMMUNITY): Payer: PPO

## 2019-04-21 ENCOUNTER — Ambulatory Visit: Payer: PPO

## 2019-04-22 ENCOUNTER — Ambulatory Visit (HOSPITAL_COMMUNITY)
Admission: RE | Admit: 2019-04-22 | Discharge: 2019-04-22 | Disposition: A | Discharge status: Home / Self Care | Payer: PPO | Source: Ambulatory Visit | Attending: Hematology | Admitting: Hematology

## 2019-04-22 ENCOUNTER — Encounter (HOSPITAL_COMMUNITY): Payer: Self-pay

## 2019-04-22 ENCOUNTER — Other Ambulatory Visit: Payer: Self-pay

## 2019-04-22 DIAGNOSIS — C259 Malignant neoplasm of pancreas, unspecified: Secondary | ICD-10-CM | POA: Diagnosis not present

## 2019-04-22 DIAGNOSIS — C787 Secondary malignant neoplasm of liver and intrahepatic bile duct: Secondary | ICD-10-CM | POA: Diagnosis not present

## 2019-04-22 DIAGNOSIS — C251 Malignant neoplasm of body of pancreas: Secondary | ICD-10-CM | POA: Diagnosis not present

## 2019-04-22 MED ORDER — IOHEXOL 300 MG/ML  SOLN
100.0000 mL | Freq: Once | INTRAMUSCULAR | Status: AC | PRN
  Administered 2019-04-22: 100 mL via INTRAVENOUS

## 2019-04-22 MED ORDER — HEPARIN SOD (PORK) LOCK FLUSH 100 UNIT/ML IV SOLN
INTRAVENOUS | Status: AC
  Filled 2019-04-22: qty 5

## 2019-04-26 ENCOUNTER — Encounter (HOSPITAL_COMMUNITY): Payer: Self-pay | Admitting: Hematology

## 2019-04-26 ENCOUNTER — Other Ambulatory Visit: Payer: Self-pay

## 2019-04-26 ENCOUNTER — Inpatient Hospital Stay (HOSPITAL_BASED_OUTPATIENT_CLINIC_OR_DEPARTMENT_OTHER): Payer: PPO | Admitting: Hematology

## 2019-04-26 DIAGNOSIS — C251 Malignant neoplasm of body of pancreas: Secondary | ICD-10-CM

## 2019-04-26 MED ORDER — OXYCODONE-ACETAMINOPHEN 5-325 MG PO TABS: 1.0000 | ORAL_TABLET | Freq: Four times a day (QID) | ORAL | 0 refills | Status: DC | PRN

## 2019-04-26 MED ORDER — MIRTAZAPINE 30 MG PO TABS: 15.0000 mg | ORAL_TABLET | Freq: Every day | ORAL | 3 refills | Status: AC

## 2019-04-26 NOTE — Progress Notes (Signed)
[place], [place] [address]   CLINIC:  Medical Oncology/Hematology  PCP:  Sharilyn Sites, [place] [address] [phone]   REASON FOR VISIT: Follow-up for metastatic pancreatic cancer   BRIEF ONCOLOGIC HISTORY:  Oncology History  Cancer of pancreas, body ([place])  02/09/2019 Initial Diagnosis   Cancer of pancreas, body ([place])   02/21/2019 -  Chemotherapy   The patient had palonosetron (ALOXI) injection 0.25 mg, 0.25 mg, Intravenous,  Once, 3 of 4 cycles Administration: 0.25 mg (02/21/2019), 0.25 mg (03/09/2019), 0.25 mg (04/04/2019) pegfilgrastim (NEULASTA) injection 6 mg, 6 mg, Subcutaneous, Once, 1 of 2 cycles Administration: 6 mg (04/06/2019) irinotecan (CAMPTOSAR) 240 mg in sodium chloride 0.9 % 500 mL chemo infusion, 150 mg/m2 = 240 mg (100 % of original dose 150 mg/m2), Intravenous,  Once, 2 of 3 cycles Dose modification: 150 mg/m2 (original dose 150 mg/m2, Cycle 2, Reason: Provider Judgment), 105 mg/m2 (70 % of original dose 150 mg/m2, Cycle 3, Reason: Other (see comments), Comment: weight loss) Administration: 240 mg (03/09/2019), 160 mg (04/04/2019) leucovorin 620 mg in dextrose 5 % 250 mL infusion, 400 mg/m2 = 620 mg, Intravenous,  Once, 3 of 4 cycles Dose modification: 280 mg/m2 (70 % of original dose 400 mg/m2, Cycle 3, Reason: Other (see comments), Comment: weight loss) Administration: 620 mg (02/21/2019), 620 mg (03/09/2019), 434 mg (04/04/2019) oxaliplatin (ELOXATIN) 130 mg in dextrose 5 % 500 mL chemo infusion, 85 mg/m2 = 130 mg, Intravenous,  Once, 3 of 4 cycles Dose modification: 59.5 mg/m2 (70 % of original dose 85 mg/m2, Cycle 3, Reason: Other (see comments), Comment: weight loss) Administration: 130 mg (02/21/2019), 130 mg (03/09/2019), 90 mg (04/04/2019) fluorouracil (ADRUCIL) 3,700 mg in sodium chloride 0.9 % 76 mL chemo infusion, 2,400 mg/m2 = 3,700 mg, Intravenous, 1 Day/Dose, 3 of 4 cycles Dose  modification: 1,680 mg/m2 (70 % of original dose 2,400 mg/m2, Cycle 3, Reason: Other (see comments), Comment: weight loss) Administration: 3,700 mg (02/21/2019), 3,700 mg (03/09/2019), 2,600 mg (04/04/2019)  for chemotherapy treatment.    03/02/2019 Genetic Testing   Negative genetic testing on the common hereditary cancer panel+pancreatic cancer panel+preliminary evidence genes for pancreatic cancer+chronic pancratitis genes.  This Gene Panel offered by [facility] includes sequencing and/or deletion duplication testing of the following 55 genes: APC, ATM, AXIN2, BARD1, BMPR1A, BRCA1, BRCA2, BRIP1, CASR, CDH1, CDK4, CDKN2A (p14ARF), CDKN2A (p16INK4a), CFTR, CHEK2, CPA1, CTNNA1, CTRC, DICER1, EPCAM (Deletion/duplication testing only), FANCC, GREM1 (promoter region deletion/duplication testing only), KIT, MEN1, MLH1, MSH2, MSH3, MSH6, MUTYH, NBN, NF1, NHTL1, PALB2, PALLD, PDGFRA, PMS2, POLD1, POLE,PRSS1, PTEN, RAD50, RAD51C, RAD51D, SDHB, SDHC, SDHD, SMAD4, SMARCA4. SPINK1, STK11, TP53, TSC1, TSC2, and VHL.  The following genes were evaluated for sequence changes only: SDHA and HOXB13 c.251G>A variant only. The report date is Mar 02, 2019.     INTERVAL HISTORY:  Ms. Balli 73 y.o. female seen for follow-up of metastatic pancreatic cancer.  Cycle 3 of chemotherapy was on 04/04/2019.  She is drinking 2 ensures per day.  Eating 3 small meals per day.  She is requiring oxycodone 5 mg 3 tablets a day for her right back pain.  She has up to 3 watery stools per day.  Her vomiting and nausea has also improved.  Appetite is 25%.  Energy levels are still low.  Denies any fevers or infections.    REVIEW OF SYSTEMS:  Review of Systems  Constitutional: Positive for fatigue.  Gastrointestinal: Positive for diarrhea and nausea.  Neurological: Positive for dizziness.  All other systems reviewed and are negative.    PAST MEDICAL/SURGICAL HISTORY:  Past Medical History:  Diagnosis Date   Cancer [facility])    hx of breast  cancer left   Diabetes mellitus    Family history of breast cancer    Family history of pancreatic cancer    Hypertension    Tobacco use 02/04/2019   Past Surgical History:  Procedure Laterality Date   bladder tack     COLONOSCOPY  06/04/2012   Procedure: COLONOSCOPY;  Surgeon: Jamesetta So, MD;  Location[facility];  Service: Gastroenterology;  Laterality: N/A;   ESOPHAGOGASTRODUODENOSCOPY  06/04/2012   Procedure: ESOPHAGOGASTRODUODENOSCOPY (EGD);  Surgeon: Jamesetta So, MD;  Location: [facility];  Service: Gastroenterology;  Laterality: N/A;   HERNIA REPAIR     IR IMAGING GUIDED PORT INSERTION  02/18/2019   IR US GUIDE BX ASP/DRAIN  02/07/2019   left mastectomy     TONSILLECTOMY     VESICO-VAGINAL FISTULA REPAIR       SOCIAL HISTORY:  Social History   Socioeconomic History   Marital status: Divorced    Spouse name: Not on file   Number of children: 3   Years of education: Not on file   Highest education level: Not on file  Occupational History   Occupation: retired  Scientist, product/process development strain: Not hard at [employer] insecurity    Worry: Never true    Inability: Never true   Transportation needs    Medical: No    Non-medical: No  Tobacco Use   Smoking status: Current Every Day Smoker    Packs/day: 0.50    Types: Cigarettes   Smokeless tobacco: Never Used  Substance and Sexual Activity   Alcohol use: Yes    Comment: socially drink   Drug use: No   Sexual activity: Yes  Lifestyle   Physical activity    Days per week: 0 days    Minutes per session: 0 min   Stress: Only a little  Relationships   Press photographer on phone: Twice a week    Gets together: Twice a week    Attends religious service: Never    Active member of club or organization: No    Attends meetings of clubs or organizations: Never    Relationship status: Not on file   Intimate partner violence    Fear of current or ex partner:  No    Emotionally abused: No    Physically abused: No    Forced sexual activity: No  Other Topics Concern   Not on file  Social History Narrative   Not on file    FAMILY HISTORY:  Family History  Problem Relation Age of Onset   Hypertension Mother    Pancreatic cancer Mother 27   Diabetes Father    Hypertension Father    Liver cancer Father    Pancreatitis Sister    Breast cancer Sister 31   Aortic dissection Sister    Aortic dissection Brother    Liver cancer Paternal Grandfather     CURRENT MEDICATIONS:  Outpatient Encounter Medications as of 04/26/2019  Medication Sig Note   albuterol (VENTOLIN HFA) 108 (90 Base) MCG/ACT inhaler Inhale 2 puffs into the lungs every 4 (four) hours as needed for wheezing or shortness of breath.    dronabinol (MARINOL) 5 MG capsule Take 1 capsule (5 mg total) by mouth 2 (two)  times daily before a meal.    glimepiride (AMARYL) 1 MG tablet Take 1 tablet (1 mg total) by mouth daily as needed. Take only if blood sugars consistently above 140's    lidocaine-prilocaine (EMLA) cream Apply 1 application topically as needed. 1 hour prior to stick and cover with plastic wrap    oxyCODONE-acetaminophen (PERCOCET/ROXICET) 5-325 MG tablet Take 1-2 tablets by mouth every 6 (six) hours as needed for moderate pain or severe pain.    prochlorperazine (COMPAZINE) 10 MG tablet TAKE 1 TABLET BY MOUTH EVERY 6 HOURS AS NEEDED FOR NAUSEA AND VOMITING    [DISCONTINUED] oxyCODONE-acetaminophen (PERCOCET/ROXICET) 5-325 MG tablet Take 1-2 tablets by mouth every 6 (six) hours as needed for moderate pain or severe pain.    B-D ULTRAFINE III SHORT PEN 31G X 8 MM MISC USE ONCE AT BEDTIME (Patient not taking: Reported on 04/26/2019) 02/21/2019: Not taking   lidocaine (LIDODERM) 5 % Place 1 patch onto the skin daily. Remove & Discard patch within 12 hours or as directed by MD (Patient not taking: Reported on 04/18/2019) 02/21/2019: Applies to her back     mirtazapine (REMERON) 30 MG tablet Take 0.5 tablets (15 mg total) by mouth at bedtime.    Multiple Vitamin (MULTIVITAMIN WITH MINERALS) TABS tablet Take 1 tablet by mouth daily. (Patient not taking: Reported on 04/18/2019)    ONETOUCH ULTRA test strip TEST TWICE DAILY (Patient not taking: Reported on 04/26/2019)    [DISCONTINUED] mirtazapine (REMERON) 15 MG tablet Take 1 tablet (15 mg total) by mouth at bedtime. (Patient not taking: Reported on 04/26/2019)    No facility-administered encounter medications on file as of 04/26/2019.     ALLERGIES:  Allergies  Allergen Reactions   Duricef [Cefadroxil] Shortness Of Breath and Swelling   Tape Rash     PHYSICAL EXAM:  ECOG Performance status: 1  Vitals:   04/26/19 1116  BP: 124/74  Pulse: 90  Resp: 16  Temp: (!) 97.5 F (36.4 C)  SpO2: 99%   Filed Weights   04/26/19 1116  Weight: 102 lb (46.3 kg)    Physical Exam Constitutional:      Appearance: Normal appearance. She is normal weight.  Cardiovascular:     Rate and Rhythm: Normal rate and regular rhythm.     Heart sounds: Normal heart sounds.  Pulmonary:     Effort: Pulmonary effort is normal.     Breath sounds: Normal breath sounds.  Abdominal:     General: Bowel sounds are normal.     Palpations: Abdomen is soft.  Musculoskeletal: Normal range of motion.  Skin:    General: Skin is warm and dry.  Neurological:     Mental Status: She is alert and oriented to person, place, and time. Mental status is at baseline.  Psychiatric:        Mood and Affect: Mood normal.        Behavior: Behavior normal.        Thought Content: Thought content normal.        Judgment: Judgment normal.      LABORATORY DATA:  I have reviewed the labs as listed.  CBC    Component Value Date/Time   WBC 17.9 (H) 04/18/2019 0851   RBC 3.84 (L) 04/18/2019 0851   HGB 11.5 (L) 04/18/2019 0851   HGB 10.5 (L) 02/21/2019 1153   HCT 36.6 04/18/2019 0851   PLT 282 04/18/2019 0851   PLT 345  02/21/2019 1153   MCV 95.3 04/18/2019 0851  MCH 29.9 04/18/2019 0851   MCHC 31.4 04/18/2019 0851   RDW 18.4 (H) 04/18/2019 0851   LYMPHSABS 1.7 04/18/2019 0851   MONOABS 1.2 (H) 04/18/2019 0851   EOSABS 0.2 04/18/2019 0851   BASOSABS 0.1 04/18/2019 0851   CMP Latest Ref Rng & Units 04/18/2019 04/04/2019 03/23/2019  Glucose 70 - 99 mg/dL 211(H) 205(H) 209(H)  BUN 8 - 23 mg/dL _0 Creatinine 0.44 - 1.00 mg/dL 0.74 0.75 0.80  Sodium 135 - 145 mmol/L 137 134(L) 136  Potassium 3.5 - 5.1 mmol/L 3.5 4.1 3.8  Chloride 98 - 111 mmol/L 100 98 99  CO2 22 - 32 mmol/L 23 23 21(L)  Calcium 8.9 - 10.3 mg/dL 8.7(L) 9.0 8.6(L)  Total Protein 6.5 - 8.1 g/dL 5.9(L) 5.9(L) 5.8(L)  Total Bilirubin 0.3 - 1.2 mg/dL 0.6 0.6 0.4  Alkaline Phos 38 - 126 U/L 153(H) 82 79  AST 15 - 41 U/L 17 20 12(L)  ALT 0 - 44 U/L _1 ASSESSMENT & PLAN:   Cancer of pancreas, body ([place]) 1.  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 -She reports 50 pound weight loss in the last 1 year, 16 pounds since December 2019.  She lives by herself at home. - Patient was evaluated by Dr. BJulieanne Mansonand received cycle 1 of FOLFOX on 02/21/2019. -She did experience slight diarrhea with it.  She also had some cold sensitivity.  She was counseled to drink and eat everything at room temperature for the first week. - CT chest performed on 03/04/2019 revealed known 6.1 cm pancreatic tail/body mass, hepatic mets, as well as pulmonary nodules.  Left leg Doppler did not show any DVT. -Cycle 2 of FOLFIRINOX on 03/09/2019.  Cycle 3 was on 04/04/2019 with 30% dose reduction. - Patient voiced that she no  longer wanted chemotherapy at last visit.  She is also depressed. -We started her on mirtazapine at bedtime.  Her mood has improved. - Her tumor marker improved from 32,000-18,000. -We reviewed results of CT CAP from 04/22/2019.  There is improvement in the size of the pancreatic mass, bilateral pulmonary nodules and liver metastasis. -We had a prolonged discussion about options including hospice versus restarting chemotherapy by eliminating irinotecan and continuing with FOLFOX with dose reduction to minimize side effects. - She would like to think about it and let u[place]ore[place]now next week.  2.  Abdominal and back pain: -She has right-sided abdominal pain and back pain.  She is taking Percocet 5 mg up to 3 tablets daily.  We will give her a refill.   3.  History of breast cancer: -She had left breast cancer at age [age] status post lumpectomy, chemotherapy, and radiation therapy followed by tamoxifen. 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  4.  Family history: -Mother had pancreatic cancer.  Father had liver cancer. - Paternal grand father had liver cancer.  Sister had breast cancer. -Genetic testing on 02/24/2019 was negative.  5.  Weight loss: -She lost about 9 pounds in the last 1 month. -She will continue Marinol 5 mg twice daily.  She will increase Remeron to 30 mg daily. -She  is drinking 2 ensures per day.  She is eating 3 small meals per day.   Total time spent is 40 minutes with more than 50% of the time spent face-to-face discussing scan results, treatment options including palliative care, counseling and coordination of care.  Orders placed this encounter:  No orders of the defined types were placed in this encounter.     Derek Jack, MD [place] [phone]

## 2019-04-26 NOTE — Assessment & Plan Note (Signed)
1.  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 -She reports 50 pound weight loss in the last 1 year, 16 pounds since December 2019.  She lives by herself at home. - Patient was evaluated by Dr. BJulieanne Mansonand received cycle 1 of FOLFOX on 02/21/2019. -She did experience slight diarrhea with it.  She also had some cold sensitivity.  She was counseled to drink and eat everything at room temperature for the first week. - CT chest performed on 03/04/2019 revealed known 6.1 cm pancreatic tail/body mass, hepatic mets, as well as pulmonary nodules.  Left leg Doppler did not show any DVT. -Cycle 2 of FOLFIRINOX on 03/09/2019.  Cycle 3 was on 04/04/2019 with 30% dose reduction. - Patient voiced that she no longer wanted chemotherapy at last visit.  She is also depressed. -We started her on mirtazapine at bedtime.  Her mood has improved. - Her tumor marker improved from 32,000-18,000. -We reviewed results of CT CAP from 04/22/2019.  There is improvement in the size of the pancreatic mass, bilateral pulmonary nodules and liver metastasis. -We had a prolonged discussion about options including hospice versus restarting chemotherapy by eliminating irinotecan and continuing with FOLFOX with dose reduction to minimize side effects. - She would like to think about it and let u[place]ore[place]now next week.  2.  Abdominal and back pain: -She has right-sided abdominal pain and back pain.  She is taking Percocet 5 mg up to 3 tablets daily.  We will give her a refill.   3.  History of breast cancer: -She had left breast cancer at age 73 status post lumpectomy, chemotherapy, and radiation therapy followed  by tamoxifen. 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  4.  Family history: -Mother had pancreatic cancer.  Father had liver cancer. - Paternal grand father had liver cancer.  Sister had breast cancer. -Genetic testing on 02/24/2019 was negative.  5.  Weight loss: -She lost about 9 pounds in the last 1 month. -She will continue Marinol 5 mg twice daily.  She will increase Remeron to 30 mg daily. -She is drinking 2 ensures per day.  She is eating 3 small meals per day.

## 2019-04-26 NOTE — Patient Instructions (Addendum)
[facility] at [facility] Discharge Instructions  You were seen today by Dr. Katragadda. He went over your recent lab results. He will see you back in 1 week for labs and follow up.   Thank you for choosing [facility] at [facility] to provide your oncology and hematology care.  To afford each patient quality time with our provider, please arrive at least 15 minutes before your scheduled appointment time.   If you have a lab appointment with the Cancer Center please come in thru the  Main Entrance and check in at the main information desk  You need to re-schedule your appointment should you arrive 10 or more minutes late.  We strive to give you quality time with our providers, and arriving late affects you and other patients whose appointments are after yours.  Also, if you no show three or more times for appointments you may be dismissed from the clinic at the providers discretion.     Again, thank you for choosing [facility].  Our hope is that these requests will decrease the amount of time that you wait before being seen by our physicians.       _____________________________________________________________  Should you have questions after your visit to [facility], please contact our office at [phone] between the hours of 8:00 a.m. and 4:30 p.m.  Voicemails left after 4:00 p.m. will not be returned until the following business day.  For prescription refill requests, have your pharmacy contact our office and allow 72 hours.    Cancer Center Support Programs:   > Cancer Support Group  2nd Tuesday of the month 1pm-2pm, Journey Room    

## 2019-05-02 ENCOUNTER — Other Ambulatory Visit (HOSPITAL_COMMUNITY): Payer: Self-pay | Admitting: Hematology

## 2019-05-02 ENCOUNTER — Ambulatory Visit (HOSPITAL_COMMUNITY): Payer: PPO

## 2019-05-02 ENCOUNTER — Ambulatory Visit (HOSPITAL_COMMUNITY): Payer: PPO | Admitting: Hematology

## 2019-05-02 ENCOUNTER — Other Ambulatory Visit: Payer: Self-pay

## 2019-05-02 ENCOUNTER — Other Ambulatory Visit (HOSPITAL_COMMUNITY): Payer: PPO

## 2019-05-02 ENCOUNTER — Encounter (HOSPITAL_COMMUNITY): Payer: Self-pay | Admitting: Hematology

## 2019-05-02 ENCOUNTER — Inpatient Hospital Stay (HOSPITAL_BASED_OUTPATIENT_CLINIC_OR_DEPARTMENT_OTHER): Payer: PPO | Admitting: Hematology

## 2019-05-02 VITALS — BP 112/69 | HR 117 | Temp 97.8°F | Resp 16 | Wt 101.8 lb

## 2019-05-02 DIAGNOSIS — C251 Malignant neoplasm of body of pancreas: Secondary | ICD-10-CM

## 2019-05-02 MED ORDER — LACTULOSE ENCEPHALOPATHY 10 GM/15ML PO SOLN: 20.0000 g | Freq: Every day | ORAL | 3 refills | Status: AC

## 2019-05-02 NOTE — Patient Instructions (Signed)
[facility] at [facility] Discharge Instructions  You were seen today by Dr. Katragadda. He went over your recent lab results. He will see you back in 1 week for labs and follow up.   Thank you for choosing [facility] at [facility] to provide your oncology and hematology care.  To afford each patient quality time with our provider, please arrive at least 15 minutes before your scheduled appointment time.   If you have a lab appointment with the Cancer Center please come in thru the  Main Entrance and check in at the main information desk  You need to re-schedule your appointment should you arrive 10 or more minutes late.  We strive to give you quality time with our providers, and arriving late affects you and other patients whose appointments are after yours.  Also, if you no show three or more times for appointments you may be dismissed from the clinic at the providers discretion.     Again, thank you for choosing [facility].  Our hope is that these requests will decrease the amount of time that you wait before being seen by our physicians.       _____________________________________________________________  Should you have questions after your visit to [facility], please contact our office at [phone] between the hours of 8:00 a.m. and 4:30 p.m.  Voicemails left after 4:00 p.m. will not be returned until the following business day.  For prescription refill requests, have your pharmacy contact our office and allow 72 hours.    Cancer Center Support Programs:   > Cancer Support Group  2nd Tuesday of the month 1pm-2pm, Journey Room    

## 2019-05-02 NOTE — Assessment & Plan Note (Addendum)
1.  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 - CT CAP on 04/22/2019 shows improvement in size of the pancreatic mass, bilateral pulmonary nodules and liver metastasis.  Tumor marker improved from 32,000-18,000. -Germline mutation testing on 02/24/2019 was negative. - We held her chemotherapy last time because of weight loss. - She lost about 1 pound since last visit.  We will hold her chemotherapy at this time.  Will reassess at next week.  If she continues to gain weight, we will consider starting back on FOLFOX with dose reduction.  2.  Weight loss: -She is drinking Ensure 3 times a day.  She is also eating 3 small meals. -She is currently taking Remeron 30 mg daily.  She is also taking Marinol 5 mg twice daily.  3.  Abdominal and back pain: -She is taking Percocet 5 mg 3-4 times a day which is helping.  4.  Constipation: -I have advised her to take stool softener 2 tablets daily. -I have also sent a prescription for lactulose.  If the stool softener does not help, she will take 30 mL of lactulose at bedtime.

## 2019-05-02 NOTE — [facility] (Signed)
[place], [place] [address]   CLINIC:  Medical Oncology/Hematology  PCP:  Sharilyn Sites, [place] [address] [phone]   REASON FOR VISIT: Follow-up for metastatic pancreatic cancer   BRIEF ONCOLOGIC HISTORY:  Oncology History  Cancer of pancreas, body ([place])  02/09/2019 Initial Diagnosis   Cancer of pancreas, body ([place])   02/21/2019 -  Chemotherapy   The patient had palonosetron (ALOXI) injection 0.25 mg, 0.25 mg, Intravenous,  Once, 3 of 4 cycles Administration: 0.25 mg (02/21/2019), 0.25 mg (03/09/2019), 0.25 mg (04/04/2019) pegfilgrastim (NEULASTA) injection 6 mg, 6 mg, Subcutaneous, Once, 1 of 2 cycles Administration: 6 mg (04/06/2019) irinotecan (CAMPTOSAR) 240 mg in sodium chloride 0.9 % 500 mL chemo infusion, 150 mg/m2 = 240 mg (100 % of original dose 150 mg/m2), Intravenous,  Once, 2 of 3 cycles Dose modification: 150 mg/m2 (original dose 150 mg/m2, Cycle 2, Reason: Provider Judgment), 105 mg/m2 (70 % of original dose 150 mg/m2, Cycle 3, Reason: Other (see comments), Comment: weight loss) Administration: 240 mg (03/09/2019), 160 mg (04/04/2019) leucovorin 620 mg in dextrose 5 % 250 mL infusion, 400 mg/m2 = 620 mg, Intravenous,  Once, 3 of 4 cycles Dose modification: 280 mg/m2 (70 % of original dose 400 mg/m2, Cycle 3, Reason: Other (see comments), Comment: weight loss) Administration: 620 mg (02/21/2019), 620 mg (03/09/2019), 434 mg (04/04/2019) oxaliplatin (ELOXATIN) 130 mg in dextrose 5 % 500 mL chemo infusion, 85 mg/m2 = 130 mg, Intravenous,  Once, 3 of 4 cycles Dose modification: 59.5 mg/m2 (70 % of original dose 85 mg/m2, Cycle 3, Reason: Other (see comments), Comment: weight loss) Administration: 130 mg (02/21/2019), 130 mg (03/09/2019), 90 mg (04/04/2019) fluorouracil (ADRUCIL) 3,700 mg in sodium chloride 0.9 % 76 mL chemo infusion, 2,400 mg/m2 = 3,700 mg, Intravenous, 1 Day/Dose, 3 of 4 cycles Dose  modification: 1,680 mg/m2 (70 % of original dose 2,400 mg/m2, Cycle 3, Reason: Other (see comments), Comment: weight loss) Administration: 3,700 mg (02/21/2019), 3,700 mg (03/09/2019), 2,600 mg (04/04/2019)  for chemotherapy treatment.    03/02/2019 Genetic Testing   Negative genetic testing on the common hereditary cancer panel+pancreatic cancer panel+preliminary evidence genes for pancreatic cancer+chronic pancratitis genes.  This Gene Panel offered by [facility] includes sequencing and/or deletion duplication testing of the following 55 genes: APC, ATM, AXIN2, BARD1, BMPR1A, BRCA1, BRCA2, BRIP1, CASR, CDH1, CDK4, CDKN2A (p14ARF), CDKN2A (p16INK4a), CFTR, CHEK2, CPA1, CTNNA1, CTRC, DICER1, EPCAM (Deletion/duplication testing only), FANCC, GREM1 (promoter region deletion/duplication testing only), KIT, MEN1, MLH1, MSH2, MSH3, MSH6, MUTYH, NBN, NF1, NHTL1, PALB2, PALLD, PDGFRA, PMS2, POLD1, POLE,PRSS1, PTEN, RAD50, RAD51C, RAD51D, SDHB, SDHC, SDHD, SMAD4, SMARCA4. SPINK1, STK11, TP53, TSC1, TSC2, and VHL.  The following genes were evaluated for sequence changes only: SDHA and HOXB13 c.251G>A variant only. The report date is Mar 02, 2019.     INTERVAL HISTORY:  Ms. Lanza 73 y.o. female seen for follow-up of metastatic prostate cancer.  Last chemotherapy was on 04/05/2019.  She is drinking about 3 cans of Ensure per day.  Eating 3 small meals per day.  He is requiring oxycodone 5 mg 3 to 4 tablets/day.  At last visit we have increased her Remeron to 30 mg at bedtime.  She is also taking Marinol 5 mg twice daily.  She did not notice any major improvement in appetite.  Energy levels are low.  She does report some constipation.  She is not on any stool softeners.  Denies any fevers or infections.  Denies  any ER visits or hospitalizations.   REVIEW OF SYSTEMS:  Review of Systems  Constitutional: Positive for fatigue.  Gastrointestinal: Positive for constipation.  All other systems reviewed and are negative.     PAST MEDICAL/SURGICAL HISTORY:  Past Medical History:  Diagnosis Date  . Cancer ([place])    hx of breast cancer left  . Diabetes mellitus   . Family history of breast cancer   . Family history of pancreatic cancer   . Hypertension   . Tobacco use 02/04/2019   Past Surgical History:  Procedure Laterality Date  . bladder tack    . COLONOSCOPY  06/04/2012   Procedure: COLONOSCOPY;  Surgeon: Jamesetta So, MD;  [facility];  Service: Gastroenterology;  Laterality: N/A;  . ESOPHAGOGASTRODUODENOSCOPY  06/04/2012   Procedure: ESOPHAGOGASTRODUODENOSCOPY (EGD);  Surgeon: Jamesetta So, MD;  Location[facility];  Service: Gastroenterology;  Laterality: N/A;  . HERNIA REPAIR    . IR IMAGING GUIDED PORT INSERTION  02/18/2019  . IR US GUIDE BX ASP/DRAIN  02/07/2019  . left mastectomy    . TONSILLECTOMY    . VESICO-VAGINAL FISTULA REPAIR       SOCIAL HISTORY:  Social History   Socioeconomic History  . Marital status: Divorced    Spouse name: Not on file  . Number of children: 3  . Years of education: Not on file  . Highest education level: Not on file  Occupational History  . Occupation: retired  Scientific laboratory technician  . Financial resource strain: Not hard at all  . Food insecurity    Worry: Never true    Inability: Never true  . Transportation needs    Medical: No    Non-medical: No  Tobacco Use  . Smoking status: Current Every Day Smoker    Packs/day: 0.50    Types: Cigarettes  . Smokeless tobacco: Never Used  Substance and Sexual Activity  . Alcohol use: Yes    Comment: socially drink  . Drug use: No  . Sexual activity: Yes  Lifestyle  . Physical activity    Days per week: 0 days    Minutes per session: 0 min  . Stress: Only a little  Relationships  . Social Herbalist on phone: Twice a week    Gets together: Twice a week    Attends religious service: Never    Active member of club or organization: No    Attends meetings of clubs or organizations:  Never    Relationship status: Not on file  . Intimate partner violence    Fear of current or ex partner: No    Emotionally abused: No    Physically abused: No    Forced sexual activity: No  Other Topics Concern  . Not on file  Social History Narrative  . Not on file    FAMILY HISTORY:  Family History  Problem Relation Age of Onset  . Hypertension Mother   . Pancreatic cancer Mother 78  . Diabetes Father   . Hypertension Father   . Liver cancer Father   . Pancreatitis Sister   . Breast cancer Sister 51  . Aortic dissection Sister   . Aortic dissection Brother   . Liver cancer Paternal Grandfather     CURRENT MEDICATIONS:  Outpatient Encounter Medications as of 05/02/2019  Medication Sig Note  . albuterol (VENTOLIN HFA) 108 (90 Base) MCG/ACT inhaler Inhale 2 puffs into the lungs every 4 (four) hours as needed for wheezing or shortness  of breath.   . dronabinol (MARINOL) 5 MG capsule Take 1 capsule (5 mg total) by mouth 2 (two) times daily before a meal.   . glimepiride (AMARYL) 1 MG tablet Take 1 tablet (1 mg total) by mouth daily as needed. Take only if blood sugars consistently above 140's   . lidocaine-prilocaine (EMLA) cream Apply 1 application topically as needed. 1 hour prior to stick and cover with plastic wrap   . mirtazapine (REMERON) 30 MG tablet Take 0.5 tablets (15 mg total) by mouth at bedtime.   Marland Kitchen oxyCODONE-acetaminophen (PERCOCET/ROXICET) 5-325 MG tablet Take 1-2 tablets by mouth every 6 (six) hours as needed for moderate pain or severe pain.   Marland Kitchen prochlorperazine (COMPAZINE) 10 MG tablet TAKE 1 TABLET BY MOUTH EVERY 6 HOURS AS NEEDED FOR NAUSEA AND VOMITING   . B-D ULTRAFINE III SHORT PEN 31G X 8 MM MISC USE ONCE AT BEDTIME (Patient not taking: Reported on 04/26/2019) 02/21/2019: Not taking  . lactulose, encephalopathy, (CHRONULAC) 10 GM/15ML SOLN Take 30 mLs (20 g total) by mouth at bedtime.   . lidocaine (LIDODERM) 5 % Place 1 patch onto the skin daily. Remove &  Discard patch within 12 hours or as directed by MD (Patient not taking: Reported on 04/18/2019) 02/21/2019: Applies to her back   . Multiple Vitamin (MULTIVITAMIN WITH MINERALS) TABS tablet Take 1 tablet by mouth daily. (Patient not taking: Reported on 04/18/2019)   . ONETOUCH ULTRA test strip TEST TWICE DAILY (Patient not taking: Reported on 04/26/2019)    No facility-administered encounter medications on file as of 05/02/2019.     ALLERGIES:  Allergies  Allergen Reactions  . Duricef [Cefadroxil] Shortness Of Breath and Swelling  . Tape Rash     PHYSICAL EXAM:  ECOG Performance status: 1  Vitals:   05/02/19 1206  BP: 112/69  Pulse: (!) 117  Resp: 16  Temp: 97.8 F (36.6 C)  SpO2: 98%   Filed Weights   05/02/19 1206  Weight: 101 lb 12.8 oz (46.2 kg)    Physical Exam Constitutional:      Appearance: Normal appearance. She is normal weight.  Cardiovascular:     Rate and Rhythm: Normal rate and regular rhythm.     Heart sounds: Normal heart sounds.  Pulmonary:     Effort: Pulmonary effort is normal.     Breath sounds: Normal breath sounds.  Abdominal:     General: Bowel sounds are normal.     Palpations: Abdomen is soft.  Musculoskeletal: Normal range of motion.  Skin:    General: Skin is warm and dry.  Neurological:     Mental Status: She is alert and oriented to person, place, and time. Mental status is at baseline.  Psychiatric:        Mood and Affect: Mood normal.        Behavior: Behavior normal.        Thought Content: Thought content normal.        Judgment: Judgment normal.      LABORATORY DATA:  I have reviewed the labs as listed.  CBC    Component Value Date/Time   WBC 17.9 (H) 04/18/2019 0851   RBC 3.84 (L) 04/18/2019 0851   HGB 11.5 (L) 04/18/2019 0851   HGB 10.5 (L) 02/21/2019 1153   HCT 36.6 04/18/2019 0851   PLT 282 04/18/2019 0851   PLT 345 02/21/2019 1153   MCV 95.3 04/18/2019 0851   MCH 29.9 04/18/2019 0851   MCHC 31.4 04/18/2019 0851  RDW 18.4 (H) 04/18/2019 0851   LYMPHSABS 1.7 04/18/2019 0851   MONOABS 1.2 (H) 04/18/2019 0851   EOSABS 0.2 04/18/2019 0851   BASOSABS 0.1 04/18/2019 0851   CMP Latest Ref Rng & Units 04/18/2019 04/04/2019 03/23/2019  Glucose 70 - 99 mg/dL 211(H) 205(H) 209(H)  BUN 8 - 23 mg/dL 14 13 16   Creatinine 0.44 - 1.00 mg/dL 0.74 0.75 0.80  Sodium 135 - 145 mmol/L 137 134(L) 136  Potassium 3.5 - 5.1 mmol/L 3.5 4.1 3.8  Chloride 98 - 111 mmol/L 100 98 99  CO2 22 - 32 mmol/L 23 23 21(L)  Calcium 8.9 - 10.3 mg/dL 8.7(L) 9.0 8.6(L)  Total Protein 6.5 - 8.1 g/dL 5.9(L) 5.9(L) 5.8(L)  Total Bilirubin 0.3 - 1.2 mg/dL 0.6 0.6 0.4  Alkaline Phos 38 - 126 U/L 153(H) 82 79  AST 15 - 41 U/L 17 20 12(L)  ALT 0 - 44 U/L 16 15 12      ASSESSMENT & PLAN:   Cancer of pancreas, body ([place]) 1.  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 - CT CAP on 04/22/2019 shows improvement in size of the pancreatic mass, bilateral pulmonary nodules and liver metastasis.  Tumor marker improved from 32,000-18,000. -Germline mutation testing on 02/24/2019 was negative. - We held her chemotherapy last time because of weight loss. - She lost about 1 pound since last visit.  We will hold her chemotherapy at this time.  Will reassess at next week.  If she continues to gain weight, we will consider starting back on FOLFOX with dose reduction.  2.  Weight loss: -She is drinking Ensure 3 times a day.  She is also eating 3 small meals. -She is currently taking Remeron 30 mg daily.  She is also taking Marinol 5 mg twice daily.  3.  Abdominal and back pain: -She is taking Percocet 5 mg 3-4 times a day which is helping.  4.  Constipation: -I have advised her to take stool softener 2 tablets daily. -I have also sent a prescription for lactulose.  If the stool softener  does not help, she will take 30 mL of lactulose at bedtime.   Total time spent is 40 minutes with more than 50% of the time spent face-to-face discussing treatment options including hospice, counseling and coordination of care.  Orders placed this encounter:  No orders of the defined types were placed in this encounter.     Derek Jack, MD [place] [phone]

## 2019-05-04 ENCOUNTER — Encounter (HOSPITAL_COMMUNITY): Payer: PPO

## 2019-05-10 ENCOUNTER — Encounter (HOSPITAL_COMMUNITY): Payer: Self-pay | Admitting: Hematology

## 2019-05-10 ENCOUNTER — Inpatient Hospital Stay (HOSPITAL_BASED_OUTPATIENT_CLINIC_OR_DEPARTMENT_OTHER): Payer: PPO | Admitting: Hematology

## 2019-05-10 ENCOUNTER — Other Ambulatory Visit: Payer: Self-pay

## 2019-05-10 ENCOUNTER — Inpatient Hospital Stay (HOSPITAL_COMMUNITY): Payer: PPO

## 2019-05-10 DIAGNOSIS — C251 Malignant neoplasm of body of pancreas: Secondary | ICD-10-CM

## 2019-05-10 LAB — CBC WITH DIFFERENTIAL/PLATELET
Abs Immature Granulocytes: 0.05 10*3/uL (ref 0.00–0.07)
Basophils Absolute: 0.1 10*3/uL (ref 0.0–0.1)
Basophils Relative: 1 %
Eosinophils Absolute: 0.5 10*3/uL (ref 0.0–0.5)
Eosinophils Relative: 4 %
HCT: 35.3 % — ABNORMAL LOW (ref 36.0–46.0)
Hemoglobin: 11.1 g/dL — ABNORMAL LOW (ref 12.0–15.0)
Immature Granulocytes: 0 %
Lymphocytes Relative: 15 %
Lymphs Abs: 1.6 10*3/uL (ref 0.7–4.0)
MCH: 30.8 pg (ref 26.0–34.0)
MCHC: 31.4 g/dL (ref 30.0–36.0)
MCV: 98.1 fL (ref 80.0–100.0)
Monocytes Absolute: 0.9 10*3/uL (ref 0.1–1.0)
Monocytes Relative: 8 %
Neutro Abs: 8.2 10*3/uL — ABNORMAL HIGH (ref 1.7–7.7)
Neutrophils Relative %: 72 %
Platelets: 314 10*3/uL (ref 150–400)
RBC: 3.6 MIL/uL — ABNORMAL LOW (ref 3.87–5.11)
RDW: 18.2 % — ABNORMAL HIGH (ref 11.5–15.5)
WBC: 11.3 10*3/uL — ABNORMAL HIGH (ref 4.0–10.5)
nRBC: 0 % (ref 0.0–0.2)

## 2019-05-10 LAB — COMPREHENSIVE METABOLIC PANEL
ALT: 21 U/L (ref 0–44)
AST: 25 U/L (ref 15–41)
Albumin: 3.2 g/dL — ABNORMAL LOW (ref 3.5–5.0)
Alkaline Phosphatase: 205 U/L — ABNORMAL HIGH (ref 38–126)
Anion gap: 11 (ref 5–15)
BUN: 14 mg/dL (ref 8–23)
CO2: 21 mmol/L — ABNORMAL LOW (ref 22–32)
Calcium: 9 mg/dL (ref 8.9–10.3)
Chloride: 99 mmol/L (ref 98–111)
Creatinine, Ser: 0.65 mg/dL (ref 0.44–1.00)
GFR calc Af Amer: >60 mL/min (ref >60)
GFR calc non Af Amer: >60 mL/min (ref >60)
Glucose, Bld: 316 mg/dL — ABNORMAL HIGH (ref 70–99)
Potassium: 4.6 mmol/L (ref 3.5–5.1)
Sodium: 131 mmol/L — ABNORMAL LOW (ref 135–145)
Total Bilirubin: 0.3 mg/dL (ref 0.3–1.2)
Total Protein: 6.3 g/dL — ABNORMAL LOW (ref 6.5–8.1)

## 2019-05-10 MED ORDER — SODIUM CHLORIDE 0.9% FLUSH
10.0000 mL | Freq: Once | INTRAVENOUS | Status: AC
  Administered 2019-05-10: 10:00:00 10 mL

## 2019-05-10 MED ORDER — HEPARIN SOD (PORK) LOCK FLUSH 100 UNIT/ML IV SOLN
500.0000 [IU] | Freq: Once | INTRAVENOUS | Status: AC
  Administered 2019-05-10: 500 [IU] via INTRAVENOUS

## 2019-05-10 NOTE — [facility] Notes (Signed)
[place], [place] [address]   CLINIC:  Medical Oncology/Hematology  PCP:  Sharilyn Sites, [place] [address] [phone]   REASON FOR VISIT: Follow-up for metastatic pancreatic cancer   BRIEF ONCOLOGIC HISTORY:  Oncology History  Cancer of pancreas, body ([place])  02/09/2019 Initial Diagnosis   Cancer of pancreas, body ([place])   02/21/2019 -  Chemotherapy   The patient had palonosetron (ALOXI) injection 0.25 mg, 0.25 mg, Intravenous,  Once, 3 of 4 cycles Administration: 0.25 mg (02/21/2019), 0.25 mg (03/09/2019), 0.25 mg (04/04/2019) pegfilgrastim (NEULASTA) injection 6 mg, 6 mg, Subcutaneous, Once, 1 of 1 cycle Administration: 6 mg (04/06/2019) pegfilgrastim-jmdb (FULPHILA) injection 6 mg, 6 mg, Subcutaneous,  Once, 0 of 1 cycle irinotecan (CAMPTOSAR) 240 mg in sodium chloride 0.9 % 500 mL chemo infusion, 150 mg/m2 = 240 mg (100 % of original dose 150 mg/m2), Intravenous,  Once, 2 of 3 cycles Dose modification: 150 mg/m2 (original dose 150 mg/m2, Cycle 2, Reason: Provider Judgment), 105 mg/m2 (70 % of original dose 150 mg/m2, Cycle 3, Reason: Other (see comments), Comment: weight loss) Administration: 240 mg (03/09/2019), 160 mg (04/04/2019) leucovorin 620 mg in dextrose 5 % 250 mL infusion, 400 mg/m2 = 620 mg, Intravenous,  Once, 3 of 4 cycles Dose modification: 280 mg/m2 (70 % of original dose 400 mg/m2, Cycle 3, Reason: Other (see comments), Comment: weight loss) Administration: 620 mg (02/21/2019), 620 mg (03/09/2019), 434 mg (04/04/2019) oxaliplatin (ELOXATIN) 130 mg in dextrose 5 % 500 mL chemo infusion, 85 mg/m2 = 130 mg, Intravenous,  Once, 3 of 4 cycles Dose modification: 59.5 mg/m2 (70 % of original dose 85 mg/m2, Cycle 3, Reason: Other (see comments), Comment: weight loss) Administration: 130 mg (02/21/2019), 130 mg (03/09/2019), 90 mg (04/04/2019) fluorouracil (ADRUCIL) 3,700 mg in sodium chloride 0.9 % 76 mL chemo  infusion, 2,400 mg/m2 = 3,700 mg, Intravenous, 1 Day/Dose, 3 of 4 cycles Dose modification: 1,680 mg/m2 (70 % of original dose 2,400 mg/m2, Cycle 3, Reason: Other (see comments), Comment: weight loss) Administration: 3,700 mg (02/21/2019), 3,700 mg (03/09/2019), 2,600 mg (04/04/2019)  for chemotherapy treatment.    03/02/2019 Genetic Testing   Negative genetic testing on the common hereditary cancer panel+pancreatic cancer panel+preliminary evidence genes for pancreatic cancer+chronic pancratitis genes.  This Gene Panel offered by [facility] includes sequencing and/or deletion duplication testing of the following 55 genes: APC, ATM, AXIN2, BARD1, BMPR1A, BRCA1, BRCA2, BRIP1, CASR, CDH1, CDK4, CDKN2A (p14ARF), CDKN2A (p16INK4a), CFTR, CHEK2, CPA1, CTNNA1, CTRC, DICER1, EPCAM (Deletion/duplication testing only), FANCC, GREM1 (promoter region deletion/duplication testing only), KIT, MEN1, MLH1, MSH2, MSH3, MSH6, MUTYH, NBN, NF1, NHTL1, PALB2, PALLD, PDGFRA, PMS2, POLD1, POLE,PRSS1, PTEN, RAD50, RAD51C, RAD51D, SDHB, SDHC, SDHD, SMAD4, SMARCA4. SPINK1, STK11, TP53, TSC1, TSC2, and VHL.  The following genes were evaluated for sequence changes only: SDHA and HOXB13 c.251G>A variant only. The report date is Mar 02, 2019.     INTERVAL HISTORY:  Ms. Cavallaro 73 y.o. female seen for follow-up of metastatic pancreatic cancer.  Last chemotherapy was on 04/05/2019.  She is drinking about 3 cans of Ensure per day.  She is not eating much in terms of meals.  1 of her daughters or sons is staying with her every day around the clock.  Constipation is managed well with lactulose.  She does have some fatigue.  Right-sided back pain is managed with 2 oxycodone's every 6 hours.  Denies any fevers or infections.   REVIEW OF SYSTEMS:  Review of  Systems  Constitutional: Positive for fatigue.  Gastrointestinal: Positive for constipation.  All other systems reviewed and are negative.    PAST MEDICAL/SURGICAL HISTORY:  Past  Medical History:  Diagnosis Date  . Cancer ([facility])    hx of breast cancer left  . Diabetes mellitus   . Family history of breast cancer   . Family history of pancreatic cancer   . Hypertension   . Tobacco use 02/04/2019   Past Surgical History:  Procedure Laterality Date  . bladder tack    . COLONOSCOPY  06/04/2012   Procedure: COLONOSCOPY;  Surgeon: Jamesetta So, MD;  [facility];  Service: Gastroenterology;  Laterality: N/A;  . ESOPHAGOGASTRODUODENOSCOPY  06/04/2012   Procedure: ESOPHAGOGASTRODUODENOSCOPY (EGD);  Surgeon: Jamesetta So, MD;  [facility];  Service: Gastroenterology;  Laterality: N/A;  . HERNIA REPAIR    . IR IMAGING GUIDED PORT INSERTION  02/18/2019  . IR US GUIDE BX ASP/DRAIN  02/07/2019  . left mastectomy    . TONSILLECTOMY    . VESICO-VAGINAL FISTULA REPAIR       SOCIAL HISTORY:  Social History   Socioeconomic History  . Marital status: Divorced    Spouse name: Not on file  . Number of children: 3  . Years of education: Not on file  . Highest education level: Not on file  Occupational History  . Occupation: retired  Scientific laboratory technician  . Financial resource strain: Not hard at all  . Food insecurity    Worry: Never true    Inability: Never true  . Transportation needs    Medical: No    Non-medical: No  Tobacco Use  . Smoking status: Current Every Day Smoker    Packs/day: 0.50    Types: Cigarettes  . Smokeless tobacco: Never Used  Substance and Sexual Activity  . Alcohol use: Yes    Comment: socially drink  . Drug use: No  . Sexual activity: Yes  Lifestyle  . Physical activity    Days per week: 0 days    Minutes per session: 0 min  . Stress: Only a little  Relationships  . Social Herbalist on phone: Twice a week    Gets together: Twice a week    Attends religious service: Never    Active member of club or organization: No    Attends meetings of clubs or organizations: Never    Relationship status: Not on  file  . Intimate partner violence    Fear of current or ex partner: No    Emotionally abused: No    Physically abused: No    Forced sexual activity: No  Other Topics Concern  . Not on file  Social History Narrative  . Not on file    FAMILY HISTORY:  Family History  Problem Relation Age of Onset  . Hypertension Mother   . Pancreatic cancer Mother 53  . Diabetes Father   . Hypertension Father   . Liver cancer Father   . Pancreatitis Sister   . Breast cancer Sister 37  . Aortic dissection Sister   . Aortic dissection Brother   . Liver cancer Paternal Grandfather     CURRENT MEDICATIONS:  Outpatient Encounter Medications as of 05/10/2019  Medication Sig Note  . albuterol (VENTOLIN HFA) 108 (90 Base) MCG/ACT inhaler Inhale 2 puffs into the lungs every 4 (four) hours as needed for wheezing or shortness of breath.   . dronabinol (MARINOL) 5 MG capsule Take 1 capsule (  5 mg total) by mouth 2 (two) times daily before a meal.   . glimepiride (AMARYL) 1 MG tablet Take 1 tablet (1 mg total) by mouth daily as needed. Take only if blood sugars consistently above 140's   . lactulose (CHRONULAC) 10 GM/15ML solution    . lactulose, encephalopathy, (CHRONULAC) 10 GM/15ML SOLN Take 30 mLs (20 g total) by mouth at bedtime.   . lidocaine-prilocaine (EMLA) cream Apply 1 application topically as needed. 1 hour prior to stick and cover with plastic wrap   . mirtazapine (REMERON) 30 MG tablet Take 0.5 tablets (15 mg total) by mouth at bedtime.   Marland Kitchen oxyCODONE-acetaminophen (PERCOCET/ROXICET) 5-325 MG tablet Take 1-2 tablets by mouth every 6 (six) hours as needed for moderate pain or severe pain.   Marland Kitchen prochlorperazine (COMPAZINE) 10 MG tablet TAKE 1 TABLET BY MOUTH EVERY 6 HOURS AS NEEDED FOR NAUSEA AND VOMITING   . B-D ULTRAFINE III SHORT PEN 31G X 8 MM MISC USE ONCE AT BEDTIME (Patient not taking: Reported on 04/26/2019) 02/21/2019: Not taking  . lidocaine (LIDODERM) 5 % Place 1 patch onto the skin daily.  Remove & Discard patch within 12 hours or as directed by MD (Patient not taking: Reported on 04/18/2019) 02/21/2019: Applies to her back   . Multiple Vitamin (MULTIVITAMIN WITH MINERALS) TABS tablet Take 1 tablet by mouth daily. (Patient not taking: Reported on 04/18/2019)   . ONETOUCH ULTRA test strip TEST TWICE DAILY (Patient not taking: Reported on 04/26/2019)    No facility-administered encounter medications on file as of 05/10/2019.     ALLERGIES:  Allergies  Allergen Reactions  . Duricef [Cefadroxil] Shortness Of Breath and Swelling  . Tape Rash     PHYSICAL EXAM:  ECOG Performance status: 1  Vitals:   05/10/19 0850  BP: 132/77  Pulse: 87  Resp: 16  Temp: 97.8 F (36.6 C)  SpO2: 100%   Filed Weights   05/10/19 0850  Weight: 100 lb (45.4 kg)    Physical Exam Constitutional:      Appearance: Normal appearance. She is normal weight.  Cardiovascular:     Rate and Rhythm: Normal rate and regular rhythm.     Heart sounds: Normal heart sounds.  Pulmonary:     Effort: Pulmonary effort is normal.     Breath sounds: Normal breath sounds.  Abdominal:     General: Bowel sounds are normal.     Palpations: Abdomen is soft.  Musculoskeletal: Normal range of motion.  Skin:    General: Skin is warm and dry.  Neurological:     Mental Status: She is alert and oriented to person, place, and time. Mental status is at baseline.  Psychiatric:        Mood and Affect: Mood normal.        Behavior: Behavior normal.        Thought Content: Thought content normal.        Judgment: Judgment normal.      LABORATORY DATA:  I have reviewed the labs as listed.  CBC    Component Value Date/Time   WBC 11.3 (H) 05/10/2019 0841   RBC 3.60 (L) 05/10/2019 0841   HGB 11.1 (L) 05/10/2019 0841   HGB 10.5 (L) 02/21/2019 1153   HCT 35.3 (L) 05/10/2019 0841   PLT 314 05/10/2019 0841   PLT 345 02/21/2019 1153   MCV 98.1 05/10/2019 0841   MCH 30.8 05/10/2019 0841   MCHC 31.4 05/10/2019 0841    RDW 18.2 (H) 05/10/2019  0841   LYMPHSABS 1.6 05/10/2019 0841   MONOABS 0.9 05/10/2019 0841   EOSABS 0.5 05/10/2019 0841   BASOSABS 0.1 05/10/2019 0841   CMP Latest Ref Rng & Units 05/10/2019 04/18/2019 04/04/2019  Glucose 70 - 99 mg/dL 316(H) 211(H) 205(H)  BUN 8 - 23 mg/dL _0 Creatinine 0.44 - 1.00 mg/dL 0.65 0.74 0.75  Sodium 135 - 145 mmol/L 131(L) 137 134(L)  Potassium 3.5 - 5.1 mmol/L 4.6 3.5 4.1  Chloride 98 - 111 mmol/L 99 100 98  CO2 22 - 32 mmol/L 21(L) 23 23  Calcium 8.9 - 10.3 mg/dL 9.0 8.7(L) 9.0  Total Protein 6.5 - 8.1 g/dL 6.3(L) 5.9(L) 5.9(L)  Total Bilirubin 0.3 - 1.2 mg/dL 0.3 0.6 0.6  Alkaline Phos 38 - 126 U/L 205(H) 153(H) 82  AST 15 - 41 U/L _1 ALT 0 - 44 U/L _2 ASSESSMENT & PLAN:   Cancer of pancreas, body ([place]) 1.  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 - CT CAP on 04/22/2019 shows improvement in size of the pancreatic mass, bilateral pulmonary nodules and liver metastasis.  Tumor marker improved from 32,000-18,000. -Germline mutation testing on 02/24/2019 was negative. - We are continuing to hold her chemotherapy secondary to weight loss. -I will reevaluate her in 1 week.  If she continues to lose weight, we will consider hospice evaluation.  If she has any weight gain, will consider dose reduced FOLFOX.  2.  Weight loss: -She is drinking about 3 ensures per day.  She is trying to eat some small meals. -She lost about 20 pounds since May of this year.  She is taking Remeron 30 mg at bedtime.  She is also taking Marinol 5 mg twice daily. -She was told to increase intake of Ensure to 5/day.   3.  Abdominal and back pain: -She is taking Percocet 2 tablets every 6 hours.   4.  Constipation: -I have advised her to take stool softener 2 tablets daily. -I have also  sent a prescription for lactulose.  If the stool softener does not help, she will take 30 mL of lactulose at bedtime.  Total time spent is 25 minutes with more than 50% of the time spent face-to-face discussing treatment plan, counseling and coordination of care.  Orders placed this encounter:  No orders of the defined types were placed in this encounter.     Derek Jack, MD [place] [phone]

## 2019-05-10 NOTE — Patient Instructions (Addendum)
[facility] at [facility] Discharge Instructions  You were seen today by Dr. Katragadda. He went over your recent lab results. He will see you back in 1 week for labs and follow up.   Thank you for choosing  [facility] at [facility] to provide your oncology and hematology care.  To afford each patient quality time with our provider, please arrive at least 15 minutes before your scheduled appointment time.   If you have a lab appointment with the Cancer Center please come in thru the  Main Entrance and check in at the main information desk  You need to re-schedule your appointment should you arrive 10 or more minutes late.  We strive to give you quality time with our providers, and arriving late affects you and other patients whose appointments are after yours.  Also, if you no show three or more times for appointments you may be dismissed from the clinic at the providers discretion.     Again, thank you for choosing [facility].  Our hope is that these requests will decrease the amount of time that you wait before being seen by our physicians.       _____________________________________________________________  Should you have questions after your visit to [facility], please contact our office at [phone] between the hours of 8:00 a.m. and 4:30 p.m.  Voicemails left after 4:00 p.m. will not be returned until the following business day.  For prescription refill requests, have your pharmacy contact our office and allow 72 hours.    Cancer Center Support Programs:   > Cancer Support Group  2nd Tuesday of the month 1pm-2pm, Journey Room    

## 2019-05-10 NOTE — Assessment & Plan Note (Signed)
1.  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 - CT CAP on 04/22/2019 shows improvement in size of the pancreatic mass, bilateral pulmonary nodules and liver metastasis.  Tumor marker improved from 32,000-18,000. -Germline mutation testing on 02/24/2019 was negative. - We are continuing to hold her chemotherapy secondary to weight loss. -I will reevaluate her in 1 week.  If she continues to lose weight, we will consider hospice evaluation.  If she has any weight gain, will consider dose reduced FOLFOX.  2.  Weight loss: -She is drinking about 3 ensures per day.  She is trying to eat some small meals. -She lost about 20 pounds since May of this year.  She is taking Remeron 30 mg at bedtime.  She is also taking Marinol 5 mg twice daily. -She was told to increase intake of Ensure to 5/day.   3.  Abdominal and back pain: -She is taking Percocet 2 tablets every 6 hours.   4.  Constipation: -I have advised her to take stool softener 2 tablets daily. -I have also sent a prescription for lactulose.  If the stool softener does not help, she will take 30 mL of lactulose at bedtime.

## 2019-05-10 NOTE — Patient Instructions (Signed)
[facility] at [facility] Discharge Instructions  Labs drawn from portacath today   Thank you for choosing [facility] at [facility] to provide your oncology and hematology care.  To afford each patient quality time with our provider, please arrive at least 15 minutes before your scheduled appointment time.   If you have a lab appointment with the Cancer Center please come in thru the  Main Entrance and check in at the main information desk  You need to re-schedule your appointment should you arrive 10 or more minutes late.  We strive to give you quality time with our providers, and arriving late affects you and other patients whose appointments are after yours.  Also, if you no show three or more times for appointments you may be dismissed from the clinic at the providers discretion.     Again, thank you for choosing  Cancer Center.  Our hope is that these requests will decrease the amount of time that you wait before being seen by our physicians.       _____________________________________________________________  Should you have questions after your visit to  Cancer Center, please contact our office at [phone] between the hours of 8:00 a.m. and 4:30 p.m.  Voicemails left after 4:00 p.m. will not be returned until the following business day.  For prescription refill requests, have your pharmacy contact our office and allow 72 hours.    Cancer Center Support Programs:   > Cancer Support Group  2nd Tuesday of the month 1pm-2pm, Journey Room   

## 2019-05-10 NOTE — Progress Notes (Signed)
No treatment today per message from [facility] LPN. F/U in 1 week. No new orders today.

## 2019-05-12 ENCOUNTER — Other Ambulatory Visit: Payer: Self-pay

## 2019-05-12 ENCOUNTER — Emergency Department (HOSPITAL_COMMUNITY): Payer: PPO

## 2019-05-12 ENCOUNTER — Telehealth (HOSPITAL_COMMUNITY): Payer: Self-pay | Admitting: Emergency Medicine

## 2019-05-12 ENCOUNTER — Inpatient Hospital Stay (HOSPITAL_COMMUNITY)
Admission: EM | Admit: 2019-05-12 | Discharge: 2019-05-14 | DRG: 638 | Disposition: A | Discharge status: Home / Self Care | Payer: PPO | Attending: Internal Medicine | Admitting: Internal Medicine

## 2019-05-12 ENCOUNTER — Encounter (HOSPITAL_COMMUNITY): Payer: Self-pay | Admitting: Emergency Medicine

## 2019-05-12 ENCOUNTER — Encounter (HOSPITAL_COMMUNITY): Payer: PPO

## 2019-05-12 DIAGNOSIS — Z20828 Contact with and (suspected) exposure to other viral communicable diseases: Secondary | ICD-10-CM | POA: Diagnosis present

## 2019-05-12 DIAGNOSIS — C251 Malignant neoplasm of body of pancreas: Secondary | ICD-10-CM | POA: Diagnosis present

## 2019-05-12 DIAGNOSIS — C259 Malignant neoplasm of pancreas, unspecified: Secondary | ICD-10-CM | POA: Diagnosis not present

## 2019-05-12 DIAGNOSIS — D638 Anemia in other chronic diseases classified elsewhere: Secondary | ICD-10-CM | POA: Diagnosis not present

## 2019-05-12 DIAGNOSIS — E1165 Type 2 diabetes mellitus with hyperglycemia: Secondary | ICD-10-CM | POA: Diagnosis present

## 2019-05-12 DIAGNOSIS — E875 Hyperkalemia: Secondary | ICD-10-CM | POA: Diagnosis present

## 2019-05-12 DIAGNOSIS — Z803 Family history of malignant neoplasm of breast: Secondary | ICD-10-CM

## 2019-05-12 DIAGNOSIS — Z833 Family history of diabetes mellitus: Secondary | ICD-10-CM | POA: Diagnosis not present

## 2019-05-12 DIAGNOSIS — Z7984 Long term (current) use of oral hypoglycemic drugs: Secondary | ICD-10-CM

## 2019-05-12 DIAGNOSIS — R739 Hyperglycemia, unspecified: Secondary | ICD-10-CM

## 2019-05-12 DIAGNOSIS — F17210 Nicotine dependence, cigarettes, uncomplicated: Secondary | ICD-10-CM | POA: Diagnosis present

## 2019-05-12 DIAGNOSIS — I10 Essential (primary) hypertension: Secondary | ICD-10-CM | POA: Diagnosis present

## 2019-05-12 DIAGNOSIS — Z79899 Other long term (current) drug therapy: Secondary | ICD-10-CM | POA: Diagnosis not present

## 2019-05-12 DIAGNOSIS — Z03818 Encounter for observation for suspected exposure to other biological agents ruled out: Secondary | ICD-10-CM | POA: Diagnosis not present

## 2019-05-12 DIAGNOSIS — Z720 Tobacco use: Secondary | ICD-10-CM | POA: Diagnosis not present

## 2019-05-12 DIAGNOSIS — E119 Type 2 diabetes mellitus without complications: Secondary | ICD-10-CM | POA: Diagnosis not present

## 2019-05-12 DIAGNOSIS — E1100 Type 2 diabetes mellitus with hyperosmolarity without nonketotic hyperglycemic-hyperosmolar coma (NKHHC): Principal | ICD-10-CM | POA: Diagnosis present

## 2019-05-12 DIAGNOSIS — Z853 Personal history of malignant neoplasm of breast: Secondary | ICD-10-CM | POA: Diagnosis not present

## 2019-05-12 DIAGNOSIS — E871 Hypo-osmolality and hyponatremia: Secondary | ICD-10-CM | POA: Diagnosis not present

## 2019-05-12 DIAGNOSIS — J209 Acute bronchitis, unspecified: Secondary | ICD-10-CM | POA: Diagnosis not present

## 2019-05-12 DIAGNOSIS — Z8507 Personal history of malignant neoplasm of pancreas: Secondary | ICD-10-CM

## 2019-05-12 DIAGNOSIS — G893 Neoplasm related pain (acute) (chronic): Secondary | ICD-10-CM | POA: Diagnosis not present

## 2019-05-12 DIAGNOSIS — J449 Chronic obstructive pulmonary disease, unspecified: Secondary | ICD-10-CM | POA: Diagnosis present

## 2019-05-12 LAB — URINALYSIS, ROUTINE W REFLEX MICROSCOPIC
Bilirubin Urine: NEGATIVE
Glucose, UA: >=500 mg/dL — AB
Hgb urine dipstick: NEGATIVE
Ketones, ur: NEGATIVE mg/dL
Leukocytes,Ua: NEGATIVE
Nitrite: NEGATIVE
Protein, ur: NEGATIVE mg/dL
Specific Gravity, Urine: 1.027 (ref 1.005–1.030)
pH: 7 (ref 5.0–8.0)

## 2019-05-12 LAB — BASIC METABOLIC PANEL
Anion gap: 12 (ref 5–15)
BUN: 27 mg/dL — ABNORMAL HIGH (ref 8–23)
CO2: 23 mmol/L (ref 22–32)
Calcium: 8.9 mg/dL (ref 8.9–10.3)
Chloride: 95 mmol/L — ABNORMAL LOW (ref 98–111)
Creatinine, Ser: 0.88 mg/dL (ref 0.44–1.00)
GFR calc Af Amer: >60 mL/min (ref >60)
GFR calc non Af Amer: >60 mL/min (ref >60)
Glucose, Bld: 904 mg/dL (ref 70–99)
Potassium: 5.4 mmol/L — ABNORMAL HIGH (ref 3.5–5.1)
Sodium: 130 mmol/L — ABNORMAL LOW (ref 135–145)

## 2019-05-12 LAB — CBC
HCT: 34.4 % — ABNORMAL LOW (ref 36.0–46.0)
Hemoglobin: 10.6 g/dL — ABNORMAL LOW (ref 12.0–15.0)
MCH: 30.7 pg (ref 26.0–34.0)
MCHC: 30.8 g/dL (ref 30.0–36.0)
MCV: 99.7 fL (ref 80.0–100.0)
Platelets: 293 10*3/uL (ref 150–400)
RBC: 3.45 MIL/uL — ABNORMAL LOW (ref 3.87–5.11)
RDW: 18.4 % — ABNORMAL HIGH (ref 11.5–15.5)
WBC: 9.3 10*3/uL (ref 4.0–10.5)
nRBC: 0 % (ref 0.0–0.2)

## 2019-05-12 LAB — HEPATIC FUNCTION PANEL
ALT: 21 U/L (ref 0–44)
AST: 20 U/L (ref 15–41)
Albumin: 3 g/dL — ABNORMAL LOW (ref 3.5–5.0)
Alkaline Phosphatase: 265 U/L — ABNORMAL HIGH (ref 38–126)
Bilirubin, Direct: <0.1 mg/dL (ref 0.0–0.2)
Total Bilirubin: 0.5 mg/dL (ref 0.3–1.2)
Total Protein: 6.2 g/dL — ABNORMAL LOW (ref 6.5–8.1)

## 2019-05-12 LAB — CBG MONITORING, ED
Glucose-Capillary: >600 mg/dL (ref 70–99)
Glucose-Capillary: 395 mg/dL — ABNORMAL HIGH (ref 70–99)
Glucose-Capillary: 210 mg/dL — ABNORMAL HIGH (ref 70–99)
Glucose-Capillary: 124 mg/dL — ABNORMAL HIGH (ref 70–99)
Glucose-Capillary: 157 mg/dL — ABNORMAL HIGH (ref 70–99)

## 2019-05-12 LAB — SARS CORONAVIRUS 2 BY RT PCR (HOSPITAL ORDER, PERFORMED IN ~~LOC~~ HOSPITAL LAB): SARS Coronavirus 2: NEGATIVE

## 2019-05-12 MED ORDER — ADULT MULTIVITAMIN W/MINERALS CH
1.0000 | ORAL_TABLET | Freq: Every day | ORAL | Status: DC
  Administered 2019-05-12 – 2019-05-14 (×2): 1 via ORAL
  Filled 2019-05-12 (×2): qty 1

## 2019-05-12 MED ORDER — SODIUM CHLORIDE 0.9 % IV SOLN
INTRAVENOUS | Status: DC
  Administered 2019-05-12: 20:00:00 via INTRAVENOUS

## 2019-05-12 MED ORDER — NICOTINE 7 MG/24HR TD PT24
7.0000 mg | MEDICATED_PATCH | TRANSDERMAL | Status: DC
  Administered 2019-05-13: 7 mg via TRANSDERMAL
  Filled 2019-05-12 (×6): qty 1

## 2019-05-12 MED ORDER — OXYCODONE-ACETAMINOPHEN 5-325 MG PO TABS
1.0000 | ORAL_TABLET | Freq: Four times a day (QID) | ORAL | Status: DC | PRN
  Administered 2019-05-12 – 2019-05-13 (×3): 1 via ORAL
  Administered 2019-05-13: 2 via ORAL
  Filled 2019-05-12: qty 1
  Filled 2019-05-12: qty 2
  Filled 2019-05-12 (×2): qty 1

## 2019-05-12 MED ORDER — MIRTAZAPINE 15 MG PO TABS
15.0000 mg | ORAL_TABLET | Freq: Every day | ORAL | Status: DC
  Administered 2019-05-12 – 2019-05-13 (×2): 15 mg via ORAL
  Filled 2019-05-12 (×2): qty 1

## 2019-05-12 MED ORDER — LACTULOSE 10 GM/15ML PO SOLN
20.0000 g | Freq: Every day | ORAL | Status: DC
  Administered 2019-05-12 – 2019-05-13 (×2): 20 g via ORAL
  Filled 2019-05-12 (×2): qty 30

## 2019-05-12 MED ORDER — INSULIN REGULAR(HUMAN) IN NACL 100-0.9 UT/100ML-% IV SOLN
INTRAVENOUS | Status: DC
  Administered 2019-05-12: 5.4 [IU]/h via INTRAVENOUS
  Filled 2019-05-12: qty 100

## 2019-05-12 MED ORDER — SODIUM CHLORIDE 0.9 % IV BOLUS
1000.0000 mL | Freq: Once | INTRAVENOUS | Status: AC
  Administered 2019-05-12: 1000 mL via INTRAVENOUS

## 2019-05-12 MED ORDER — ALBUTEROL SULFATE HFA 108 (90 BASE) MCG/ACT IN AERS
2.0000 | INHALATION_SPRAY | RESPIRATORY_TRACT | Status: DC | PRN
  Filled 2019-05-12: qty 6.7

## 2019-05-12 MED ORDER — PROCHLORPERAZINE MALEATE 5 MG PO TABS: 5.0000 mg | ORAL_TABLET | Freq: Four times a day (QID) | ORAL | Status: DC | PRN

## 2019-05-12 MED ORDER — DEXTROSE-NACL 5-0.45 % IV SOLN
INTRAVENOUS | Status: DC
  Administered 2019-05-12: 21:00:00 via INTRAVENOUS

## 2019-05-12 MED ORDER — INSULIN ASPART 100 UNIT/ML ~~LOC~~ SOLN
0.0000 [IU] | SUBCUTANEOUS | Status: DC
  Administered 2019-05-13: 2 [IU] via SUBCUTANEOUS
  Filled 2019-05-12: qty 1

## 2019-05-12 MED ORDER — METOCLOPRAMIDE HCL 5 MG/ML IJ SOLN: 10.0000 mg | Freq: Two times a day (BID) | INTRAMUSCULAR | Status: DC | PRN

## 2019-05-12 MED ORDER — ENOXAPARIN SODIUM 40 MG/0.4ML ~~LOC~~ SOLN
40.0000 mg | SUBCUTANEOUS | Status: DC
  Administered 2019-05-12 – 2019-05-13 (×2): 40 mg via SUBCUTANEOUS
  Filled 2019-05-12 (×2): qty 0.4

## 2019-05-12 MED ORDER — LIDOCAINE 5 % EX PTCH
1.0000 | MEDICATED_PATCH | CUTANEOUS | Status: DC
  Administered 2019-05-12 – 2019-05-13 (×2): 1 via TRANSDERMAL
  Filled 2019-05-12 (×5): qty 1

## 2019-05-12 MED ORDER — IPRATROPIUM-ALBUTEROL 0.5-2.5 (3) MG/3ML IN SOLN: 3.0000 mL | Freq: Four times a day (QID) | RESPIRATORY_TRACT | Status: DC | PRN

## 2019-05-12 NOTE — ED Notes (Addendum)
ECG monitor reading HR 0. Zoll monitor applied, HR 80's. EDP aware.

## 2019-05-12 NOTE — Telephone Encounter (Signed)
Pts daughter called. States glucose is <600. Per Lala Lund, NP pt is to go to ER for evaluation of hyperglycemia. Patients daughter verbalized understanding of this.

## 2019-05-12 NOTE — H&P (Signed)
TRH H&P    Patient Demographics:    Kristine Rodriguez, is a 73 y.o. female  MRN: [id number]  DOB - 11-09-1945  Admit Date - 05/12/2019  Referring MD/NP/PA: PA Triplett  Outpatient Primary MD for the patient is Sharilyn Sites, MD  Patient coming from: Home  Chief complaint-listlessness   HPI:    Kristine Rodriguez  is a 73 y.o. female, with a past medical history of pancreatic cancer, diabetes type 2, hypertension, COPD, and tobacco abuse, presents with a feeling of listlessness.  Patient reports that she had been feeling fatigued and weak yesterday and today.  Patient reports that her daughter came over to see her and noticed that she was not acting like her normal self, so they decided to check her sugar.  At home her blood glucose read high on her meter.So they decided to come into the ED.   She reports that she had been feeling nausea, but this has become normal for her given her treatment.  Patient reports last time she threw up was yesterday.  There was no blood in the emesis, and it appeared to be undigested food.  Patient also reports that she was having some abdominal pain from constipation, up until 4 days ago.  She had relief from constipation has had regular bowel movements since.  Patient reports that her doctor told her to stop taking her diabetes medications.  She reports that her doctor said that she did not have a diabetes problem, she had a pancreas problem.  Patient reports that she has been on insulin in the past, but she does not remember how much.  She reports that she was told to stop taking that as well as her oral hypoglycemics.  Patient has had nausea and vomiting secondary to pancreatic cancer and chemotherapy, causing her to lose weight.  Her oncologist has advised her to take 5 Ensure per day to try to maintain her weight.  She has not been using low sugar Ensure. This in combination with her not taking her  diabetes medications has resulted in her hyperglycemia. .  She reports that Ensure, and soups, are the only thing she has been able to keep down.  Patient reports that the last time that she checked her glucose before today was on Saturday, and it was in the 300s.  On chart review last hemoglobin A1c was 8.5 approximately 4 months ago.  Patient reports no fever, chills, increased shortness of breath, myalgias, dysuria, diarrhea.  She does report her "normal cancer pain."  This pain occurs in her right lower quadrant and right upper quadrant of her abdomen.  It is controlled with her current pain regimen.  She also reports her normal back pain.  She reports that it is between her shoulder blades and feels like a tension pain.  Her normal Percocet does not affect her back pain.  ED course  In the ED a chemistry panel showed a pseudohyponatremia at 130, this is in the setting of a glucose of 904.  Her potassium was 5.4, bicarb was  23, anion gap was 12.  She did have an elevated alk phos of 265, this is up trending from 2 days ago in the low 200s, and before that in the 150s.  Complete blood count showed a hemoglobin of 10.6, hematocrit of 34.4, platelets of 293, and normal white blood cell count.  Chest x-ray was done that showed minimal bronchitic changes without infiltrate.  EKG was done, and reviewed by myself, and shows normal sinus rhythm with a rate of 78 and a QTC of 447.    Review of systems:    In addition to the HPI above,  No Fever-chills, No Headache, No changes with Vision or hearing, No problems swallowing food or Liquids, No Chest pain, Positive for Shortness of Breath 2/2 COPD, Positive for abdominal pain, nausea, and vomiting - bowel movements are regular, No Blood in stool or Urine, No dysuria, No new skin rashes or bruises, No new joints pains-aches,  No new weakness, tingling, numbness in any extremity, Positive for recent weight loss on chemotherapy, No polyuria, polydypsia or  polyphagia, No significant Mental Stressors.  All other systems reviewed and are negative.    Past History of the following :    Past Medical History:  Diagnosis Date   Cancer ([facility])    hx of breast cancer left   Diabetes mellitus    Family history of breast cancer    Family history of pancreatic cancer    Hypertension    Tobacco use 02/04/2019      Past Surgical History:  Procedure Laterality Date   bladder tack     COLONOSCOPY  06/04/2012   Procedure: COLONOSCOPY;  Surgeon: Jamesetta So, MD;  [facility];  Service: Gastroenterology;  Laterality: N/A;   ESOPHAGOGASTRODUODENOSCOPY  06/04/2012   Procedure: ESOPHAGOGASTRODUODENOSCOPY (EGD);  Surgeon: Jamesetta So, MD;  Location: [facility];  Service: Gastroenterology;  Laterality: N/A;   HERNIA REPAIR     IR IMAGING GUIDED PORT INSERTION  02/18/2019   IR US GUIDE BX ASP/DRAIN  02/07/2019   left mastectomy     TONSILLECTOMY     VESICO-VAGINAL FISTULA REPAIR        Social History:      Social History   Tobacco Use   Smoking status: Current Every Day Smoker    Packs/day: 0.50    Types: Cigarettes   Smokeless tobacco: Never Used  Substance Use Topics   Alcohol use: Yes    Comment: socially drink       Family History :     Family History  Problem Relation Age of Onset   Hypertension Mother    Pancreatic cancer Mother 60   Diabetes Father    Hypertension Father    Liver cancer Father    Pancreatitis Sister    Breast cancer Sister 72   Aortic dissection Sister    Aortic dissection Brother    Liver cancer Paternal Jon Gills    Son - lesions on pancreas and colon, still undergoing workup.    Home Medications:   Prior to Admission medications   Medication Sig Start Date End Date Taking? Authorizing Provider  oxyCODONE-acetaminophen (PERCOCET/ROXICET) 5-325 MG tablet Take 1-2 tablets by mouth every 6 (six) hours as needed for moderate pain or severe pain. 04/26/19   Yes Derek Jack, MD  albuterol (VENTOLIN HFA) 108 (90 Base) MCG/ACT inhaler Inhale 2 puffs into the lungs every 4 (four) hours as needed for wheezing or shortness of breath.    [provider]  B-D ULTRAFINE III SHORT PEN 31G X 8 MM MISC USE ONCE AT BEDTIME Patient not taking: Reported on 04/26/2019 12/06/18   Cassandria Anger, MD  dronabinol (MARINOL) 5 MG capsule Take 1 capsule (5 mg total) by mouth 2 (two) times daily before a meal. 03/23/19   Derek Jack, MD  glimepiride (AMARYL) 1 MG tablet Take 1 tablet (1 mg total) by mouth daily as needed. Take only if blood sugars consistently above 140's 02/08/19 02/08/20  Rai, Vernelle Emerald, MD  lactulose [facility]) 10 GM/15ML solution  05/02/19   [provider]  lactulose, encephalopathy, (CHRONULAC) 10 GM/15ML SOLN Take 30 mLs (20 g total) by mouth at bedtime. 05/02/19   Derek Jack, MD  lidocaine (LIDODERM) 5 % Place 1 patch onto the skin daily. Remove & Discard patch within 12 hours or as directed by MD Patient not taking: Reported on 04/18/2019 02/08/19   Rai, Vernelle Emerald, MD  lidocaine-prilocaine (EMLA) cream Apply 1 application topically as needed. 1 hour prior to stick and cover with plastic wrap 02/15/19   Ladell Pier, MD  mirtazapine (REMERON) 30 MG tablet Take 0.5 tablets (15 mg total) by mouth at bedtime. 04/26/19   Derek Jack, MD  Multiple Vitamin (MULTIVITAMIN WITH MINERALS) TABS tablet Take 1 tablet by mouth daily. Patient not taking: Reported on 04/18/2019 02/09/19   Mendel Corning, MD  [facility] ULTRA test strip TEST TWICE DAILY Patient not taking: Reported on 04/26/2019 04/18/19   Cassandria Anger, MD  prochlorperazine (COMPAZINE) 10 MG tablet TAKE 1 TABLET BY MOUTH EVERY 6 HOURS AS NEEDED FOR NAUSEA AND VOMITING 04/18/19   Derek Jack, MD     Allergies:     Allergies  Allergen Reactions   Duricef [Cefadroxil] Shortness Of Breath and Swelling   Tape Rash     Physical  Exam:   Vitals  Blood pressure (!) 165/88, pulse 78, temperature 98.3 F (36.8 C), temperature source Oral, resp. rate (!) 21, SpO2 94 %.  1.  General: Patient lying left lateral recumbent in bed, in no apparent distress. Pacer pads on chest wall because wall monitor malfunctioned.  2. Psychiatric: Pleasant, with intact judgment and insight. AO x 3  3. Neurologic: 5/5 strength in all 4 extremities. Cranial nerves II-XII grossly intact.   4. HEENMT:  Atraumatic, normocephalic, no thyromegaly, trachea midline, anicteric eyes - with some tearing. Conjunctiva are not injected. There is no purulent discharge.   5. Respiratory : Lungs have minimal wheezing in the lower lung fields BL. No crackles. Chest wall moves symmetrically, no use of accessory muscles.   6. Cardiovascular : HRRR, no murmurs, no rubs.   7. Gastrointestinal:  Tenderness to palpation in RLQ, soft, normal bowel sounds.   8. Skin:  No new lesions or rashes.        Data Review:    CBC Recent Labs  Lab 05/10/19 0841 05/12/19 1649  WBC 11.3* 9.3  HGB 11.1* 10.6*  HCT 35.3* 34.4*  PLT 314 293  MCV 98.1 99.7  MCH 30.8 30.7  MCHC 31.4 30.8  RDW 18.2* 18.4*  LYMPHSABS 1.6  --   MONOABS 0.9  --   EOSABS 0.5  --   BASOSABS 0.1  --    ------------------------------------------------------------------------------------------------------------------  Results for orders placed or performed during the hospital encounter of 05/12/19 (from the past 48 hour(s))  CBG monitoring, ED     Status: Abnormal   Collection Time: 05/12/19  4:12 PM  Result Value Ref Range   Glucose-Capillary >  600 (HH) 70 - 99 mg/dL  Urinalysis, Routine w reflex microscopic     Status: Abnormal   Collection Time: 05/12/19  4:15 PM  Result Value Ref Range   Color, Urine STRAW (A) YELLOW   APPearance CLEAR CLEAR   Specific Gravity, Urine 1.027 1.005 - 1.030   pH 7.0 5.0 - 8.0   Glucose, UA >=500 (A) NEGATIVE mg/dL   Hgb urine dipstick  NEGATIVE NEGATIVE   Bilirubin Urine NEGATIVE NEGATIVE   Ketones, ur NEGATIVE NEGATIVE mg/dL   Protein, ur NEGATIVE NEGATIVE mg/dL   Nitrite NEGATIVE NEGATIVE   Leukocytes,Ua NEGATIVE NEGATIVE   RBC / HPF 0-5 0 - 5 RBC/hpf   WBC, UA 0-5 0 - 5 WBC/hpf   Bacteria, UA RARE (A) NONE SEEN   Squamous Epithelial / LPF 0-5 0 - 5    Comment: Performed at [facility], [address] [place],  [address]  SARS Coronavirus 2 (CEPHEID - Performed in [place] hospital lab), Hosp Order     Status: None   Collection Time: 05/12/19  4:42 PM   Specimen: Nasopharyngeal Swab  Result Value Ref Range   SARS Coronavirus 2 NEGATIVE NEGATIVE    Comment: (NOTE) If result is NEGATIVE SARS-CoV-2 target nucleic acids are NOT DETECTED. The SARS-CoV-2 RNA is generally detectable in upper and lower  respiratory specimens during the acute phase of infection. The lowest  concentration of SARS-CoV-2 viral copies this assay can detect is 250  copies / mL. A negative result does not preclude SARS-CoV-2 infection  and should not be used as the sole basis for treatment or other  patient management decisions.  A negative result may occur with  improper specimen collection / handling, submission of specimen other  than nasopharyngeal swab, presence of viral mutation(s) within the  areas targeted by this assay, and inadequate number of viral copies  (<250 copies / mL). A negative result must be combined with clinical  observations, patient history, and epidemiological information. If result is POSITIVE SARS-CoV-2 target nucleic acids are DETECTED. The SARS-CoV-2 RNA is generally detectable in upper and lower  respiratory specimens dur ing the acute phase of infection.  Positive  results are indicative of active infection with SARS-CoV-2.  Clinical  correlation with patient history and other diagnostic information is  necessary to determine patient infection status.  Positive results do  not rule out bacterial  infection or co-infection with other viruses. If result is PRESUMPTIVE POSTIVE SARS-CoV-2 nucleic acids MAY BE PRESENT.   A presumptive positive result was obtained on the submitted specimen  and confirmed on repeat testing.  While 2019 novel coronavirus  (SARS-CoV-2) nucleic acids may be present in the submitted sample  additional confirmatory testing may be necessary for epidemiological  and / or clinical management purposes  to differentiate between  SARS-CoV-2 and other Sarbecovirus currently known to infect humans.  If clinically indicated additional testing with an alternate test  methodology [phone]) is advised. The SARS-CoV-2 RNA is generally  detectable in upper and lower respiratory sp ecimens during the acute  phase of infection. The expected result is Negative. Fact Sheet for Patients:  StrictlyIdeas.no Fact Sheet for Healthcare Providers: BankingDealers.co.za This test is not yet approved or cleared by the [place] FDA and has been authorized for detection and/or diagnosis of SARS-CoV-2 by FDA under an Emergency Use Authorization (EUA).  This EUA will remain in effect (meaning this test can be used) for the duration of the COVID-19 declaration under Section 564(b)(1) of the  Act, 21 U.S.C. section 360bbb-3(b)(1), unless the authorization is terminated or revoked sooner. Performed at [facility], [address] [place], [place] [address]   Basic metabolic panel     Status: Abnormal   Collection Time: 05/12/19  4:49 PM  Result Value Ref Range   Sodium 130 (L) 135 - 145 mmol/L   Potassium 5.4 (H) 3.5 - 5.1 mmol/L   Chloride 95 (L) 98 - 111 mmol/L   CO2 23 22 - 32 mmol/L   Glucose, Bld 904 (HH) 70 - 99 mg/dL    Comment: CRITICAL RESULT CALLED TO, READ BACK BY AND VERIFIED WITH: CRAWFORD,H AT 1725 ON 05/12/2019 BY JPM    BUN 27 (H) 8 - 23 mg/dL   Creatinine, Ser 0.88 0.44 - 1.00 mg/dL   Calcium 8.9 8.9 - 10.3 mg/dL    GFR calc non Af Amer >60 >60 mL/min   GFR calc Af Amer >60 >60 mL/min   Anion gap 12 5 - 15    Comment: Performed at [facility], [address] [place], [place] [address]  CBC     Status: Abnormal   Collection Time: 05/12/19  4:49 PM  Result Value Ref Range   WBC 9.3 4.0 - 10.5 K/uL   RBC 3.45 (L) 3.87 - 5.11 MIL/uL   Hemoglobin 10.6 (L) 12.0 - 15.0 g/dL   HCT 34.4 (L) 36.0 - 46.0 %   MCV 99.7 80.0 - 100.0 fL   MCH 30.7 26.0 - 34.0 pg   MCHC 30.8 30.0 - 36.0 g/dL   RDW 18.4 (H) 11.5 - 15.5 %   Platelets 293 150 - 400 K/uL   nRBC 0.0 0.0 - 0.2 %    Comment: Performed at [facility], [address] [place], [place] [address]  Hepatic function panel     Status: Abnormal   Collection Time: 05/12/19  4:49 PM  Result Value Ref Range   Total Protein 6.2 (L) 6.5 - 8.1 g/dL   Albumin 3.0 (L) 3.5 - 5.0 g/dL   AST 20 15 - 41 U/L   ALT 21 0 - 44 U/L   Alkaline Phosphatase 265 (H) 38 - 126 U/L   Total Bilirubin 0.5 0.3 - 1.2 mg/dL   Bilirubin, Direct <0.1 0.0 - 0.2 mg/dL   Indirect Bilirubin NOT CALCULATED 0.3 - 0.9 mg/dL    Comment: Performed at [facility], [address] [place], [place] [address]  CBG monitoring, ED     Status: Abnormal   Collection Time: 05/12/19  7:38 PM  Result Value Ref Range   Glucose-Capillary 395 (H) 70 - 99 mg/dL  POC CBG, ED     Status: Abnormal   Collection Time: 05/12/19  8:38 PM  Result Value Ref Range   Glucose-Capillary 210 (H) 70 - 99 mg/dL    Chemistries  Recent Labs  Lab 05/10/19 0841 05/12/19 1649  NA 131* 130*  K 4.6 5.4*  CL 99 95*  CO2 21* 23  GLUCOSE 316* 904*  BUN 14 27*  CREATININE 0.65 0.88  CALCIUM 9.0 8.9  AST 25 20  ALT 21 21  ALKPHOS 205* 265*  BILITOT 0.3 0.5   ------------------------------------------------------------------------------------------------------------------  ------------------------------------------------------------------------------------------------------------------ GFR: Estimated Creatinine  Clearance: 41.4 mL/min (by C-G formula based on SCr of 0.88 mg/dL). Liver Function Tests: Recent Labs  Lab 05/10/19 0841 05/12/19 1649  AST 25 20  ALT 21 21  ALKPHOS 205* 265*  BILITOT 0.3 0.5  PROT 6.3* 6.2*  ALBUMIN 3.2* 3.0*   No results  for input(s): LIPASE, AMYLASE in the last 168 hours. No results for input(s): AMMONIA in the last 168 hours. Coagulation Profile: No results for input(s): INR, PROTIME in the last 168 hours. Cardiac Enzymes: No results for input(s): CKTOTAL, CKMB, CKMBINDEX, TROPONINI in the last 168 hours. BNP (last 3 results) No results for input(s): PROBNP in the last 8760 hours. HbA1C: No results for input(s): HGBA1C in the last 72 hours. CBG: Recent Labs  Lab 05/12/19 1612 05/12/19 1938 05/12/19 2038  GLUCAP >600* 395* 210*   Lipid Profile: No results for input(s): CHOL, HDL, LDLCALC, TRIG, CHOLHDL, LDLDIRECT in the last 72 hours. Thyroid Function Tests: No results for input(s): TSH, T4TOTAL, FREET4, T3FREE, THYROIDAB in the last 72 hours. Anemia Panel: No results for input(s): VITAMINB12, FOLATE, FERRITIN, TIBC, IRON, RETICCTPCT in the last 72 hours.  --------------------------------------------------------------------------------------------------------------- Urine analysis:    Component Value Date/Time   COLORURINE STRAW (A) 05/12/2019 1615   APPEARANCEUR CLEAR 05/12/2019 1615   LABSPEC 1.027 05/12/2019 1615   PHURINE 7.0 05/12/2019 1615   GLUCOSEU >=500 (A) 05/12/2019 1615   HGBUR NEGATIVE 05/12/2019 1615   BILIRUBINUR NEGATIVE 05/12/2019 1615   KETONESUR NEGATIVE 05/12/2019 1615   PROTEINUR NEGATIVE 05/12/2019 1615   UROBILINOGEN 1.0 09/16/2007 1009   NITRITE NEGATIVE 05/12/2019 1615   LEUKOCYTESUR NEGATIVE 05/12/2019 1615      Imaging Results:    Dg Chest Portable 1 View  Result Date: 05/12/2019 CLINICAL DATA:  Weakness, history pancreatic cancer EXAM: PORTABLE CHEST 1 VIEW COMPARISON:  Portable exam 1652 hours compared to  02/05/2019 FINDINGS: RIGHT jugular Port-A-Cath with tip projecting over SVC. Normal heart size, mediastinal contours, and pulmonary vascularity. Atherosclerotic calcification aorta. Minimal bronchitic changes. No acute infiltrate, pleural effusion, or pneumothorax. No acute osseous findings. IMPRESSION: Minimal bronchitic changes without infiltrate. Electronically Signed   By: Lavonia Dana M.D.   On: 05/12/2019 17:10    My personal review of EKG: Rhythm NSR, Rate 78/min, QTc 447,no Acute ST changes   Assessment & Plan:    Active Problems:   Hyperglycemia without ketosis   1. Hyperglycemia without Ketosis in the setting of DMII 1. Patient started on Glucose stabilizer protocol in the ED. Glucose trending down from 904 to 395 after one hour on insulin drip. Potassium was 5.4 before drip was started. Will repeat BMP to assess shift of potassium. Continue accuchecks per protocol. Plan to transition for sliding scale coverage overnight.   2. Diabetes Mellitus Type II 1. Patient reports that she stopped taking all of her diabetes medications in May at the reported recommendation of her provider. Patient's las Hgb A1C was 141 days ago and it was 8.5. We will repeat today. Patient counseled that she will likely need to go back on medication to control her glucose. 3. Hyperkalemia 1. Likely due to extracellular shift 2/2 hyperglycemia and will likely correct with glucose correction. Repeat BMP this evening.  4. Iatrogenic Nausea 1. 2/2 to Pancreatic cancer and its treatment. Patient on Compazine at home. We have continued that here. We will also add Reglan for PRN use.  5. Pseudohyponatremia 1. Corrected sodium is 143. Continue to monitor on BMPs.  6. Elevated Alk Phos 1. Trending up. Likely secondary to malignancy. There is a correlation between ALP and elevated glucose as well that could be contributing. Continue to monitor. 7. Normocytic anemia 1. Chronic. Baseline is around 11 g/dl. No signs of  active bleed, no symptoms of anemia. Continue to monitor with daily CBCs. 8. Tobacco abuse  1. Patient is  contemplative. Reports that she has tried to quit smoking before, but it only lasted a couple of days. We discussed the importance of tobacco cessation and the complications of continued tobacco use. Patient reports understanding. She is not ready to quit at this time. I, personally spent more than 5 minutes discussing smoking cessation with patient. Will prescribe nicotine patch for this hospitalization. 9. COPD w/o supplemental oxygen requirement 1. Continue home respiratory regimen. Adding Duonebs as needed for wheezing/shortness of breath.  10. HTN 1. Patient reports that she was advised not to take her HTN medications. BP at this time 155/65. Continue to monitor. Consider adding oral anti-hypertensive if Bps remain elevated.  11.    DVT Prophylaxis-   Lovenox - SCDs  AM Labs Ordered, also please review Full Orders  Family Communication: Admission, patients condition and plan of care including tests being ordered have been discussed with the patient indicates understanding and agree with the plan and Code Status.  Code Status:  FULL  Admission status: Observation: Based on patients clinical presentation and evaluation of above clinical data, I have made determination that patient meets observation criteria at this time  Time spent in minutes : 55   Brallan Denio B Zierle-Ghosh M.D on 05/12/2019 at 8:49 PM

## 2019-05-12 NOTE — ED Provider Notes (Signed)
Medical screening examination/treatment/procedure(s) were conducted as a shared visit with non-physician practitioner(s) and myself.  I personally evaluated the patient during the encounter.     Patient seen by me along with physician assistant.  Patient followed by hematology oncology for pancreatic cancer.  Patient's been struggling with a lot of weight loss lately so they have held the chemo treatments.  It seems as if patient probably was noncompliant with her diabetic regimen.  Blood sugars here in the 900s.  Does not appear to be in DKA on initial labs.  Patient will be started on glucose stabilizer will require admission   Her EKG did not crossover.  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  Some baseline wandering in leads II.   ED ECG REPORT   Date: 05/12/2019  Rate: 78  Rhythm: normal sinus rhythm  QRS Axis: normal  Intervals: normal  ST/T Wave abnormalities: nonspecific ST changes  Conduction Disutrbances:right bundle branch block and left anterior fascicular block  Narrative Interpretation:   Old EKG Reviewed: unchanged  I have personally reviewed the EKG tracing and agree   with the computerized printout as noted.     CRITICAL CARE Performed by: Fredia Sorrow Total critical care time: 30 minutes Critical care time was exclusive of separately billable procedures and treating other patients. Critical care was necessary to treat or prevent imminent or life-threatening deterioration. Critical care was time spent personally by me on the following activities: development of treatment plan with patient and/or surrogate as well as nursing, discussions with consultants, evaluation of patient's response to treatment, examination of patient, obtaining history from patient or surrogate, ordering and performing treatments and interventions, ordering and review of laboratory studies,  ordering and review of radiographic studies, pulse oximetry and re-evaluation of patient's condition.     Fredia Sorrow, MD 05/12/19 Drema Halon

## 2019-05-12 NOTE — ED Notes (Signed)
CRITICAL VALUE ALERT  Critical Value: Glucose 904  Date & Time Notied: 05/12/2019 1730  Provider Notified: Dr. Rogene Houston  Orders Received/Actions taken: none

## 2019-05-12 NOTE — ED Triage Notes (Signed)
Patient reports current Stage 4 pancreatic cancer. CBG check today was > 600.

## 2019-05-12 NOTE — ED Provider Notes (Signed)
[facility] EMERGENCY DEPARTMENT Provider Note   CSN: [id number] Arrival date & time: 05/12/19  1605     History   Chief Complaint Chief Complaint  Patient presents with  . Hyperglycemia    HPI Kristine Rodriguez is a 73 y.o. female.     HPI   Kristine Rodriguez is a 73 y.o. female with PMH of stage 4 pancreatic CA, receiving chemotherapy, diabetes, HTN who presents to the Emergency Department complaining of elevated blood sugar today.  patient admits that she does not check her blood sugars routinely and today her daughter encouraged to to check it and her meter was read "high."  She also states that she drank an Ensure shortly before checking her sugar.  She complains of generalized fatigue and decreased appetite, but states this is typical for her.  She has been losing weight recently and her oncologist, Dr. Worthy Keeler has discontinued her chemotherapy treatments due to her weight loss.  Last treatment was 5 weeks ago.  She denies dizziness, chest pain, abdominal pain, nausea, vomiting or diarrhea. No fever or chills, or known COVID exposures.     Past Medical History:  Diagnosis Date  . Cancer ([place])    hx of breast cancer left  . Diabetes mellitus   . Family history of breast cancer   . Family history of pancreatic cancer   . Hypertension   . Tobacco use 02/04/2019    Patient Active Problem List   Diagnosis Date Noted  . Genetic testing 03/03/2019  . Personal history of breast cancer 02/24/2019  . Family history of breast cancer 02/24/2019  . Family history of pancreatic cancer   . Cancer of pancreas, body ([place]) 02/09/2019  . Goals of care, counseling/discussion 02/09/2019  . Pancreatic mass 02/05/2019  . AKI (acute kidney injury) ([place]) 02/04/2019  . Tobacco use 02/04/2019  . Urinary tract infection 02/04/2019  . Hyponatremia 02/04/2019  . Lactic acidosis 02/04/2019  . Sepsis due to urinary tract infection ([place]) 02/04/2019  . Mixed hyperlipidemia 08/12/2017  . Uncontrolled  type 2 diabetes mellitus with hyperglycemia ([place]) 08/12/2017  . Current smoker 08/12/2017  . Essential hypertension, benign 08/12/2017    Past Surgical History:  Procedure Laterality Date  . bladder tack    . COLONOSCOPY  06/04/2012   Procedure: COLONOSCOPY;  Surgeon: Jamesetta So, MD;  Location: [facility] ENDO SUITE;  Service: Gastroenterology;  Laterality: N/A;  . ESOPHAGOGASTRODUODENOSCOPY  06/04/2012   Procedure: ESOPHAGOGASTRODUODENOSCOPY (EGD);  Surgeon: Jamesetta So, MD;  Location: [facility] ENDO SUITE;  Service: Gastroenterology;  Laterality: N/A;  . HERNIA REPAIR    . IR IMAGING GUIDED PORT INSERTION  02/18/2019  . IR US GUIDE BX ASP/DRAIN  02/07/2019  . left mastectomy    . TONSILLECTOMY    . VESICO-VAGINAL FISTULA REPAIR       OB History    Gravida  3   Para  3   Term  3   Preterm      AB      Living        SAB      TAB      Ectopic      Multiple      Live Births               Home Medications    Prior to Admission medications   Medication Sig Start Date End Date Taking? Authorizing Provider  albuterol (VENTOLIN HFA) 108 (90 Base) MCG/ACT inhaler Inhale 2 puffs into the lungs  every 4 (four) hours as needed for wheezing or shortness of breath.    [provider]  B-D ULTRAFINE III SHORT PEN 31G X 8 MM MISC USE ONCE AT BEDTIME Patient not taking: Reported on 04/26/2019 12/06/18   Cassandria Anger, MD  dronabinol (MARINOL) 5 MG capsule Take 1 capsule (5 mg total) by mouth 2 (two) times daily before a meal. 03/23/19   Derek Jack, MD  glimepiride (AMARYL) 1 MG tablet Take 1 tablet (1 mg total) by mouth daily as needed. Take only if blood sugars consistently above 140's 02/08/19 02/08/20  Rai, Vernelle Emerald, MD  lactulose [facility]) 10 GM/15ML solution  05/02/19   [provider]  lactulose, encephalopathy, (CHRONULAC) 10 GM/15ML SOLN Take 30 mLs (20 g total) by mouth at bedtime. 05/02/19   Derek Jack, MD  lidocaine (LIDODERM) 5 %  Place 1 patch onto the skin daily. Remove & Discard patch within 12 hours or as directed by MD Patient not taking: Reported on 04/18/2019 02/08/19   Rai, Vernelle Emerald, MD  lidocaine-prilocaine (EMLA) cream Apply 1 application topically as needed. 1 hour prior to stick and cover with plastic wrap 02/15/19   Ladell Pier, MD  mirtazapine (REMERON) 30 MG tablet Take 0.5 tablets (15 mg total) by mouth at bedtime. 04/26/19   Derek Jack, MD  Multiple Vitamin (MULTIVITAMIN WITH MINERALS) TABS tablet Take 1 tablet by mouth daily. Patient not taking: Reported on 04/18/2019 02/09/19   Mendel Corning, MD  [facility] ULTRA test strip TEST TWICE DAILY Patient not taking: Reported on 04/26/2019 04/18/19   Cassandria Anger, MD  oxyCODONE-acetaminophen (PERCOCET/ROXICET) 5-325 MG tablet Take 1-2 tablets by mouth every 6 (six) hours as needed for moderate pain or severe pain. 04/26/19   Derek Jack, MD  prochlorperazine (COMPAZINE) 10 MG tablet TAKE 1 TABLET BY MOUTH EVERY 6 HOURS AS NEEDED FOR NAUSEA AND VOMITING 04/18/19   Derek Jack, MD    Family History Family History  Problem Relation Age of Onset  . Hypertension Mother   . Pancreatic cancer Mother 32  . Diabetes Father   . Hypertension Father   . Liver cancer Father   . Pancreatitis Sister   . Breast cancer Sister 6  . Aortic dissection Sister   . Aortic dissection Brother   . Liver cancer Paternal Grandfather     Social History Social History   Tobacco Use  . Smoking status: Current Every Day Smoker    Packs/day: 0.50    Types: Cigarettes  . Smokeless tobacco: Never Used  Substance Use Topics  . Alcohol use: Yes    Comment: socially drink  . Drug use: No     Allergies   Duricef [cefadroxil] and Tape   Review of Systems Review of Systems  Constitutional: Positive for appetite change and fatigue. Negative for chills and fever.  HENT: Negative for sore throat and trouble swallowing.   Respiratory: Negative  for cough and shortness of breath.   Cardiovascular: Negative for chest pain.  Gastrointestinal: Negative for abdominal pain, diarrhea, nausea and vomiting.  Genitourinary: Negative for dysuria.  Musculoskeletal: Negative for arthralgias and myalgias.  Skin: Negative for rash.  Neurological: Negative for dizziness, weakness, light-headedness, numbness and headaches.  Psychiatric/Behavioral: Negative for confusion.     Physical Exam Updated Vital Signs BP 117/75 (BP Location: Right Arm)   Pulse (!) 116   Temp 98.3 F (36.8 C) (Oral)   Resp 14   SpO2 94%   Physical Exam Vitals signs and  nursing note reviewed.  Constitutional:      Appearance: Normal appearance.     Comments: Patient is thin and frail appearing.    HENT:     Mouth/Throat:     Mouth: Mucous membranes are dry.  Eyes:     Extraocular Movements: Extraocular movements intact.     Conjunctiva/sclera: Conjunctivae normal.     Pupils: Pupils are equal, round, and reactive to light.  Neck:     Musculoskeletal: Normal range of motion. No muscular tenderness.  Cardiovascular:     Rate and Rhythm: Normal rate and regular rhythm.     Pulses: Normal pulses.  Pulmonary:     Effort: Pulmonary effort is normal. No respiratory distress.     Breath sounds: Normal breath sounds. No wheezing.  Chest:     Chest wall: No tenderness.  Abdominal:     General: There is no distension.     Palpations: Abdomen is soft. There is no mass.     Tenderness: There is no abdominal tenderness.  Musculoskeletal: Normal range of motion.     Right lower leg: No edema.     Left lower leg: No edema.  Lymphadenopathy:     Cervical: No cervical adenopathy.  Skin:    General: Skin is warm.     Capillary Refill: Capillary refill takes less than 2 seconds.     Findings: No rash.  Neurological:     Mental Status: She is alert and oriented to person, place, and time.     GCS: GCS eye subscore is 4. GCS verbal subscore is 5. GCS motor subscore  is 6.     Sensory: Sensation is intact. No sensory deficit.     Motor: Motor function is intact. No weakness.     Comments: CN II-XII grossly  intact      ED Treatments / Results  Labs (all labs ordered are listed, but only abnormal results are displayed) Labs Reviewed  BASIC METABOLIC PANEL - Abnormal; Notable for the following components:      Result Value   Sodium 130 (*)    Potassium 5.4 (*)    Chloride 95 (*)    Glucose, Bld 904 (*)    BUN 27 (*)    All other components within normal limits  CBC - Abnormal; Notable for the following components:   RBC 3.45 (*)    Hemoglobin 10.6 (*)    HCT 34.4 (*)    RDW 18.4 (*)    All other components within normal limits  URINALYSIS, ROUTINE W REFLEX MICROSCOPIC - Abnormal; Notable for the following components:   Color, Urine STRAW (*)    Glucose, UA >=500 (*)    Bacteria, UA RARE (*)    All other components within normal limits  HEPATIC FUNCTION PANEL - Abnormal; Notable for the following components:   Total Protein 6.2 (*)    Albumin 3.0 (*)    Alkaline Phosphatase 265 (*)    All other components within normal limits  CBG MONITORING, ED - Abnormal; Notable for the following components:   Glucose-Capillary >600 (*)    All other components within normal limits  CBG MONITORING, ED - Abnormal; Notable for the following components:   Glucose-Capillary 395 (*)    All other components within normal limits  SARS CORONAVIRUS 2 (HOSPITAL ORDER, [place] LAB)  CBG MONITORING, ED    EKG None   See Dr. Gloris [place] interpretation of EKG   Radiology Dg Chest Portable 1 View  Result Date: 05/12/2019 CLINICAL DATA:  Weakness, history pancreatic cancer EXAM: PORTABLE CHEST 1 VIEW COMPARISON:  Portable exam 1652 hours compared to 02/05/2019 FINDINGS: RIGHT jugular Port-A-Cath with tip projecting over SVC. Normal heart size, mediastinal contours, and pulmonary vascularity. Atherosclerotic calcification aorta.  Minimal bronchitic changes. No acute infiltrate, pleural effusion, or pneumothorax. No acute osseous findings. IMPRESSION: Minimal bronchitic changes without infiltrate. Electronically Signed   By: Lavonia Dana M.D.   On: 05/12/2019 17:10    Procedures Procedures (including critical care time)   CRITICAL CARE Performed by: Brissa Asante Total critical care time: 30 minutes Critical care time was exclusive of separately billable procedures and treating other patients. Critical care was necessary to treat or prevent imminent or life-threatening deterioration. Critical care was time spent personally by me on the following activities: development of treatment plan with patient and/or surrogate as well as nursing, discussions with consultants, evaluation of patient's response to treatment, examination of patient, obtaining history from patient or surrogate, ordering and performing treatments and interventions, ordering and review of laboratory studies, ordering and review of radiographic studies, pulse oximetry and re-evaluation of patient's condition.  Medications Ordered in ED Medications  dextrose 5 %-0.45 % sodium chloride infusion (has no administration in time range)  insulin regular, human (MYXREDLIN) 100 units/ 100 mL infusion (3.4 Units/hr Intravenous Rate/Dose Change 05/12/19 1945)  sodium chloride 0.9 % bolus 1,000 mL (0 mLs Intravenous Stopped 05/12/19 1945)    And  0.9 %  sodium chloride infusion ( Intravenous New Bag/Given 05/12/19 1946)  sodium chloride 0.9 % bolus 1,000 mL (0 mLs Intravenous Stopped 05/12/19 1825)     Initial Impression / Assessment and Plan / ED Course  I have reviewed the triage vital signs and the nursing notes.  Pertinent labs & imaging results that were available during my care of the patient were reviewed by me and considered in my medical decision making (see chart for details).    Blood sugar > 900.  Bicarb and anion gap reassuring.  No evidence for DKA.   Will start glucostabilizer and IVF's ordered.     Pt also seen by Dr. Rogene Houston.and II have also spoken with pt's daughter.  Pt has not been taking her diabetes medications for a while and drinking 4-5 Ensures per day along with taking Lactulose for constipation which could explain her hyperglycemia  She is otherwise well appearing and vitals reviewed.  Reports feeling better after insulin and IVF's.  Repeat CBG 395.  Will consult hospitalist for admission  2010  Consulted Dr. Gwen Pounds who agrees to see pt in dept and arrange admit.     Final Clinical Impressions(s) / ED Diagnoses   Final diagnoses:  Hyperglycemia    ED Discharge Orders    None       Kem Parkinson, PA-C 05/12/19 2015    Fredia Sorrow, MD 05/18/19 [phone]

## 2019-05-13 DIAGNOSIS — E875 Hyperkalemia: Secondary | ICD-10-CM | POA: Diagnosis present

## 2019-05-13 DIAGNOSIS — Z20828 Contact with and (suspected) exposure to other viral communicable diseases: Secondary | ICD-10-CM | POA: Diagnosis present

## 2019-05-13 DIAGNOSIS — Z853 Personal history of malignant neoplasm of breast: Secondary | ICD-10-CM | POA: Diagnosis not present

## 2019-05-13 DIAGNOSIS — C259 Malignant neoplasm of pancreas, unspecified: Secondary | ICD-10-CM

## 2019-05-13 DIAGNOSIS — E119 Type 2 diabetes mellitus without complications: Secondary | ICD-10-CM | POA: Diagnosis not present

## 2019-05-13 DIAGNOSIS — Z833 Family history of diabetes mellitus: Secondary | ICD-10-CM | POA: Diagnosis not present

## 2019-05-13 DIAGNOSIS — Z7984 Long term (current) use of oral hypoglycemic drugs: Secondary | ICD-10-CM | POA: Diagnosis not present

## 2019-05-13 DIAGNOSIS — R739 Hyperglycemia, unspecified: Secondary | ICD-10-CM | POA: Diagnosis present

## 2019-05-13 DIAGNOSIS — Z8507 Personal history of malignant neoplasm of pancreas: Secondary | ICD-10-CM | POA: Diagnosis not present

## 2019-05-13 DIAGNOSIS — Z720 Tobacco use: Secondary | ICD-10-CM | POA: Diagnosis not present

## 2019-05-13 DIAGNOSIS — E1100 Type 2 diabetes mellitus with hyperosmolarity without nonketotic hyperglycemic-hyperosmolar coma (NKHHC): Secondary | ICD-10-CM | POA: Diagnosis present

## 2019-05-13 DIAGNOSIS — Z79899 Other long term (current) drug therapy: Secondary | ICD-10-CM | POA: Diagnosis not present

## 2019-05-13 DIAGNOSIS — E871 Hypo-osmolality and hyponatremia: Secondary | ICD-10-CM | POA: Diagnosis present

## 2019-05-13 DIAGNOSIS — F17210 Nicotine dependence, cigarettes, uncomplicated: Secondary | ICD-10-CM | POA: Diagnosis present

## 2019-05-13 DIAGNOSIS — C251 Malignant neoplasm of body of pancreas: Secondary | ICD-10-CM | POA: Diagnosis not present

## 2019-05-13 DIAGNOSIS — J449 Chronic obstructive pulmonary disease, unspecified: Secondary | ICD-10-CM | POA: Diagnosis present

## 2019-05-13 DIAGNOSIS — Z803 Family history of malignant neoplasm of breast: Secondary | ICD-10-CM | POA: Diagnosis not present

## 2019-05-13 DIAGNOSIS — D638 Anemia in other chronic diseases classified elsewhere: Secondary | ICD-10-CM | POA: Diagnosis present

## 2019-05-13 DIAGNOSIS — G893 Neoplasm related pain (acute) (chronic): Secondary | ICD-10-CM | POA: Diagnosis present

## 2019-05-13 DIAGNOSIS — I10 Essential (primary) hypertension: Secondary | ICD-10-CM | POA: Diagnosis present

## 2019-05-13 LAB — COMPREHENSIVE METABOLIC PANEL
ALT: 17 U/L (ref 0–44)
AST: 21 U/L (ref 15–41)
Albumin: 2.6 g/dL — ABNORMAL LOW (ref 3.5–5.0)
Alkaline Phosphatase: 176 U/L — ABNORMAL HIGH (ref 38–126)
Anion gap: 10 (ref 5–15)
BUN: 19 mg/dL (ref 8–23)
CO2: 21 mmol/L — ABNORMAL LOW (ref 22–32)
Calcium: 8.4 mg/dL — ABNORMAL LOW (ref 8.9–10.3)
Chloride: 108 mmol/L (ref 98–111)
Creatinine, Ser: 0.51 mg/dL (ref 0.44–1.00)
GFR calc Af Amer: >60 mL/min (ref >60)
GFR calc non Af Amer: >60 mL/min (ref >60)
Glucose, Bld: 154 mg/dL — ABNORMAL HIGH (ref 70–99)
Potassium: 4.3 mmol/L (ref 3.5–5.1)
Sodium: 139 mmol/L (ref 135–145)
Total Bilirubin: 0.2 mg/dL — ABNORMAL LOW (ref 0.3–1.2)
Total Protein: 5.6 g/dL — ABNORMAL LOW (ref 6.5–8.1)

## 2019-05-13 LAB — CBC
HCT: 30.9 % — ABNORMAL LOW (ref 36.0–46.0)
Hemoglobin: 9.8 g/dL — ABNORMAL LOW (ref 12.0–15.0)
MCH: 30.5 pg (ref 26.0–34.0)
MCHC: 31.7 g/dL (ref 30.0–36.0)
MCV: 96.3 fL (ref 80.0–100.0)
Platelets: 241 10*3/uL (ref 150–400)
RBC: 3.21 MIL/uL — ABNORMAL LOW (ref 3.87–5.11)
RDW: 17.9 % — ABNORMAL HIGH (ref 11.5–15.5)
WBC: 10 10*3/uL (ref 4.0–10.5)
nRBC: 0 % (ref 0.0–0.2)

## 2019-05-13 LAB — CBG MONITORING, ED
Glucose-Capillary: 141 mg/dL — ABNORMAL HIGH (ref 70–99)
Glucose-Capillary: 152 mg/dL — ABNORMAL HIGH (ref 70–99)
Glucose-Capillary: 156 mg/dL — ABNORMAL HIGH (ref 70–99)
Glucose-Capillary: 157 mg/dL — ABNORMAL HIGH (ref 70–99)

## 2019-05-13 LAB — BASIC METABOLIC PANEL
Anion gap: 9 (ref 5–15)
BUN: 21 mg/dL (ref 8–23)
CO2: 22 mmol/L (ref 22–32)
Calcium: 8.5 mg/dL — ABNORMAL LOW (ref 8.9–10.3)
Chloride: 109 mmol/L (ref 98–111)
Creatinine, Ser: 0.46 mg/dL (ref 0.44–1.00)
GFR calc Af Amer: >60 mL/min (ref >60)
GFR calc non Af Amer: >60 mL/min (ref >60)
Glucose, Bld: 201 mg/dL — ABNORMAL HIGH (ref 70–99)
Potassium: 3.9 mmol/L (ref 3.5–5.1)
Sodium: 140 mmol/L (ref 135–145)

## 2019-05-13 LAB — HEMOGLOBIN A1C
Hgb A1c MFr Bld: 9.1 % — ABNORMAL HIGH (ref 4.8–5.6)
Mean Plasma Glucose: 214.47 mg/dL

## 2019-05-13 LAB — GLUCOSE, CAPILLARY
Glucose-Capillary: 237 mg/dL — ABNORMAL HIGH (ref 70–99)
Glucose-Capillary: 143 mg/dL — ABNORMAL HIGH (ref 70–99)
Glucose-Capillary: 154 mg/dL — ABNORMAL HIGH (ref 70–99)

## 2019-05-13 LAB — MRSA PCR SCREENING: MRSA by PCR: NEGATIVE

## 2019-05-13 MED ORDER — INSULIN ASPART 100 UNIT/ML ~~LOC~~ SOLN
0.0000 [IU] | Freq: Three times a day (TID) | SUBCUTANEOUS | Status: DC
  Administered 2019-05-13: 3 [IU] via SUBCUTANEOUS
  Administered 2019-05-13: 1 [IU] via SUBCUTANEOUS
  Administered 2019-05-14: 3 [IU] via SUBCUTANEOUS
  Administered 2019-05-14: 2 [IU] via SUBCUTANEOUS

## 2019-05-13 MED ORDER — INSULIN GLARGINE 100 UNIT/ML ~~LOC~~ SOLN
7.0000 [IU] | Freq: Every day | SUBCUTANEOUS | Status: DC
  Administered 2019-05-13 – 2019-05-14 (×2): 7 [IU] via SUBCUTANEOUS
  Filled 2019-05-13 (×5): qty 0.07

## 2019-05-13 MED ORDER — ALBUTEROL SULFATE (2.5 MG/3ML) 0.083% IN NEBU: 2.5000 mg | INHALATION_SOLUTION | RESPIRATORY_TRACT | Status: DC | PRN

## 2019-05-13 MED ORDER — GLUCERNA SHAKE PO LIQD
237.0000 mL | Freq: Three times a day (TID) | ORAL | Status: DC
  Administered 2019-05-13 (×3): 237 mL via ORAL
  Filled 2019-05-13 (×11): qty 237

## 2019-05-13 NOTE — Progress Notes (Signed)
PROGRESS NOTE    Kristine Rodriguez  BHA:[id number] DOB: Mar 19, 1946 DOA: 05/12/2019 PCP: Sharilyn Sites, MD   Brief Narrative:  73 year old with history of metastatic pancreatic cancer, diabetes mellitus type 2, COPD, essential hypertension, tobacco use came to the hospital for overall not feeling well.  She was noted to be hyperglycemic without any acidosis.  Apparently she was asked to discontinue her antidiabetic medications.  She will be started on glucose stabilizer protocol.   Assessment & Plan:   Active Problems:   Hyperglycemia   Diabetes mellitus type 2, uncontrolled-hyperglycemia Hyperglycemia without ketoacidosis, resolved - Initial blood glucose in 900 range, insulin drip turned off.  Blood glucose in acceptable range.  Lantus 7 units subcutaneous given, low sensitive sliding scale ordered. -Check hemoglobin A1c. -I had an extensive discussion with the patient and her son at bedside-recommended at least keeping patient on a sliding scale at the time of discharge.  They understand.  Depending on how her blood glucose is over 24 hours aware in the hospital, she may require some sort of basal insulin.  Pseudohyponatremia - Resolved with IV fluids and resolution of glucose.  Metastatic pancreatic cancer Abdominal pain with inconsistent oral intake. -Follow-up outpatient oncology.  Likely poor prognosis.  Anemia of chronic disease -Baseline hemoglobin around 11.  Essential hypertension - Not on any home medications at this time.  DVT prophylaxis: Lovenox Code Status: Full code Family Communication: Son at bedside Disposition Plan: Maintain hospital stay to make sure her oral intake is adequate and her blood glucose remained steady.  Inconsistent oral intake.  At risk of hypoglycemia.  Need to be in the hospital at least 24 hours.  Consultants:   None  Procedures:   None  Antimicrobials:   None   Subjective: Feels slightly nauseous this morning but much better  than yesterday.  Tells me that her diabetic meds were discontinued by her outpatient provider.  She has not been routinely been checking her blood glucose.  I provided extensive education to the patient and her son at bedside-advised to at least keep the patient on insulin sliding scale while her p.o. intake is inconsistent.  Depending on how she does over the next 24 hours in the hospital, she may also require some baseline insulin.  Review of Systems Otherwise negative except as per HPI, including: General: Denies fever, chills, night sweats or unintended weight loss. Resp: Denies cough, wheezing, shortness of breath. Cardiac: Denies chest pain, palpitations, orthopnea, paroxysmal nocturnal dyspnea. GI: Denies abdominal pain, nausea, vomiting, diarrhea or constipation GU: Denies dysuria, frequency, hesitancy or incontinence MS: Denies muscle aches, joint pain or swelling Neuro: Denies headache, neurologic deficits (focal weakness, numbness, tingling), abnormal gait Psych: Denies anxiety, depression, SI/HI/AVH Skin: Denies new rashes or lesions ID: Denies sick contacts, exotic exposures, travel  Objective: Vitals:   05/13/19 0830 05/13/19 0845 05/13/19 0900 05/13/19 1100  BP:   (!) 173/81   Pulse: 73 75 87   Resp:   16   Temp:      TempSrc:      SpO2:   99%   Weight:    44.5 kg  Height:    5\' 2"  (1.575 m)    Intake/Output Summary (Last 24 hours) at 05/13/2019 1150 Last data filed at 05/13/2019 0122 Gross per 24 hour  Intake 2174.03 ml  Output -  Net 2174.03 ml   Filed Weights   05/13/19 1100  Weight: 44.5 kg    Examination:  General exam: Appears calm and comfortable, cachectic frail-appearing.  Respiratory system: Clear to auscultation. Respiratory effort normal. Cardiovascular system: S1 & S2 heard, RRR. No JVD, murmurs, rubs, gallops or clicks. No pedal edema. Gastrointestinal system: Abdomen is nondistended, soft and nontender. No organomegaly or masses felt. Normal  bowel sounds heard. Central nervous system: Alert and oriented. No focal neurological deficits. Extremities: Symmetric 4 x 5 power. Skin: No rashes, lesions or ulcers Psychiatry: Judgement and insight appear normal. Mood & affect appropriate.     Data Reviewed:   CBC: Recent Labs  Lab 05/10/19 0841 05/12/19 1649 05/13/19 0340  WBC 11.3* 9.3 10.0  NEUTROABS 8.2*  --   --   HGB 11.1* 10.6* 9.8*  HCT 35.3* 34.4* 30.9*  MCV 98.1 99.7 96.3  PLT 314 293 976   Basic Metabolic Panel: Recent Labs  Lab 05/10/19 0841 05/12/19 1649 05/12/19 2335 05/13/19 0340  NA 131* 130* 140 139  K 4.6 5.4* 3.9 4.3  CL 99 95* 109 108  CO2 21* 23 22 21*  GLUCOSE 316* 904* 201* 154*  BUN 14 27* 21 19  CREATININE 0.65 0.88 0.46 0.51  CALCIUM 9.0 8.9 8.5* 8.4*   GFR: Estimated Creatinine Clearance: 44.7 mL/min (by C-G formula based on SCr of 0.51 mg/dL). Liver Function Tests: Recent Labs  Lab 05/10/19 0841 05/12/19 1649 05/13/19 0340  AST 25 20 21   ALT 21 21 17   ALKPHOS 205* 265* 176*  BILITOT 0.3 0.5 0.2*  PROT 6.3* 6.2* 5.6*  ALBUMIN 3.2* 3.0* 2.6*   No results for input(s): LIPASE, AMYLASE in the last 168 hours. No results for input(s): AMMONIA in the last 168 hours. Coagulation Profile: No results for input(s): INR, PROTIME in the last 168 hours. Cardiac Enzymes: No results for input(s): CKTOTAL, CKMB, CKMBINDEX, TROPONINI in the last 168 hours. BNP (last 3 results) No results for input(s): PROBNP in the last 8760 hours. HbA1C: Recent Labs    05/12/19 1649  HGBA1C 9.1*   CBG: Recent Labs  Lab 05/13/19 0009 05/13/19 0109 05/13/19 0607 05/13/19 0808 05/13/19 1138  GLUCAP 156* 141* 152* 157* 237*   Lipid Profile: No results for input(s): CHOL, HDL, LDLCALC, TRIG, CHOLHDL, LDLDIRECT in the last 72 hours. Thyroid Function Tests: No results for input(s): TSH, T4TOTAL, FREET4, T3FREE, THYROIDAB in the last 72 hours. Anemia Panel: No results for input(s): VITAMINB12,  FOLATE, FERRITIN, TIBC, IRON, RETICCTPCT in the last 72 hours. Sepsis Labs: No results for input(s): PROCALCITON, LATICACIDVEN in the last 168 hours.  Recent Results (from the past 240 hour(s))  SARS Coronavirus 2 (CEPHEID - Performed in [place] hospital lab), Hosp Order     Status: None   Collection Time: 05/12/19  4:42 PM   Specimen: Nasopharyngeal Swab  Result Value Ref Range Status   SARS Coronavirus 2 NEGATIVE NEGATIVE Final    Comment: (NOTE) If result is NEGATIVE SARS-CoV-2 target nucleic acids are NOT DETECTED. The SARS-CoV-2 RNA is generally detectable in upper and lower  respiratory specimens during the acute phase of infection. The lowest  concentration of SARS-CoV-2 viral copies this assay can detect is 250  copies / mL. A negative result does not preclude SARS-CoV-2 infection  and should not be used as the sole basis for treatment or other  patient management decisions.  A negative result may occur with  improper specimen collection / handling, submission of specimen other  than nasopharyngeal swab, presence of viral mutation(s) within the  areas targeted by this assay, and inadequate number of viral copies  (<250 copies / mL). A negative  result must be combined with clinical  observations, patient history, and epidemiological information. If result is POSITIVE SARS-CoV-2 target nucleic acids are DETECTED. The SARS-CoV-2 RNA is generally detectable in upper and lower  respiratory specimens dur ing the acute phase of infection.  Positive  results are indicative of active infection with SARS-CoV-2.  Clinical  correlation with patient history and other diagnostic information is  necessary to determine patient infection status.  Positive results do  not rule out bacterial infection or co-infection with other viruses. If result is PRESUMPTIVE POSTIVE SARS-CoV-2 nucleic acids MAY BE PRESENT.   A presumptive positive result was obtained on the submitted specimen  and  confirmed on repeat testing.  While 2019 novel coronavirus  (SARS-CoV-2) nucleic acids may be present in the submitted sample  additional confirmatory testing may be necessary for epidemiological  and / or clinical management purposes  to differentiate between  SARS-CoV-2 and other Sarbecovirus currently known to infect humans.  If clinically indicated additional testing with an alternate test  methodology [phone]) is advised. The SARS-CoV-2 RNA is generally  detectable in upper and lower respiratory sp ecimens during the acute  phase of infection. The expected result is Negative. Fact Sheet for Patients:  StrictlyIdeas.no Fact Sheet for Healthcare Providers: BankingDealers.co.za This test is not yet approved or cleared by the [place] FDA and has been authorized for detection and/or diagnosis of SARS-CoV-2 by FDA under an Emergency Use Authorization (EUA).  This EUA will remain in effect (meaning this test can be used) for the duration of the COVID-19 declaration under Section 564(b)(1) of the Act, 21 U.S.C. section 360bbb-3(b)(1), unless the authorization is terminated or revoked sooner. Performed at [facility], [address] [place], [place] [address]          Radiology Studies: Dg Chest Portable 1 View  Result Date: 05/12/2019 CLINICAL DATA:  Weakness, history pancreatic cancer EXAM: PORTABLE CHEST 1 VIEW COMPARISON:  Portable exam 1652 hours compared to 02/05/2019 FINDINGS: RIGHT jugular Port-A-Cath with tip projecting over SVC. Normal heart size, mediastinal contours, and pulmonary vascularity. Atherosclerotic calcification aorta. Minimal bronchitic changes. No acute infiltrate, pleural effusion, or pneumothorax. No acute osseous findings. IMPRESSION: Minimal bronchitic changes without infiltrate. Electronically Signed   By: Lavonia Dana M.D.   On: 05/12/2019 17:10        Scheduled Meds: . enoxaparin (LOVENOX)  injection  40 mg Subcutaneous Q24H  . feeding supplement (GLUCERNA SHAKE)  237 mL Oral TID BM  . insulin aspart  0-24 Units Subcutaneous Q4H  . insulin aspart  0-9 Units Subcutaneous TID WC  . insulin glargine  7 Units Subcutaneous Daily  . lactulose  20 g Oral QHS  . lidocaine  1 patch Transdermal Q24H  . mirtazapine  15 mg Oral QHS  . multivitamin with minerals  1 tablet Oral Daily  . nicotine  7 mg Transdermal Q24H   Continuous Infusions: . sodium chloride Stopped (05/12/19 2042)  . dextrose 5 % and 0.45% NaCl Stopped (05/13/19 0122)  . insulin Stopped (05/13/19 0122)     LOS: 0 days   Time spent= 35 mins    Ankit Arsenio Loader, MD [facility]  If 7PM-7AM, please contact night-coverage [email or web address] 05/13/2019, 11:50 AM

## 2019-05-13 NOTE — Progress Notes (Signed)
Inpatient Diabetes Program Recommendations  AACE/ADA: New Consensus Statement on Inpatient Glycemic Control (2015)  Target Ranges:  Prepandial:   less than 140 mg/dL      Peak postprandial:   less than 180 mg/dL (1-2 hours)      Critically ill patients:  140 - 180 mg/dL   Lab Results  Component Value Date   GLUCAP 157 (H) 05/13/2019   HGBA1C 8.5 (H) 12/22/2018    Review of Glycemic Control  Diabetes history: DM Outpatient Diabetes medications:Amaryl 1 mg if CBGs consistently elevated >140  Current orders for Inpatient glycemic control: Novolog 0-24 units correction scale  A1c pending  Inpatient Diabetes Program Recommendations:   -Lantus 7 units daily (0.15 units/kg x 54.4 kg) -Decrease Novolog correction scale to sensitive tid + hs 0-5 units. Secure chat to Dr. Reesa Chew with recommendations. Noted patient has been on insulin in the past  Thank you, Labria Wos. Ciel Chervenak, RN, MSN, CDE  Diabetes Coordinator Inpatient Glycemic Control Team Team Pager [phone] (8am-5pm) 05/13/2019 9:33 AM

## 2019-05-13 NOTE — ED Notes (Signed)
Verbal order to d/c insulin drip

## 2019-05-14 DIAGNOSIS — E871 Hypo-osmolality and hyponatremia: Secondary | ICD-10-CM

## 2019-05-14 DIAGNOSIS — Z720 Tobacco use: Secondary | ICD-10-CM

## 2019-05-14 DIAGNOSIS — C251 Malignant neoplasm of body of pancreas: Secondary | ICD-10-CM

## 2019-05-14 DIAGNOSIS — E1100 Type 2 diabetes mellitus with hyperosmolarity without nonketotic hyperglycemic-hyperosmolar coma (NKHHC): Principal | ICD-10-CM

## 2019-05-14 DIAGNOSIS — I10 Essential (primary) hypertension: Secondary | ICD-10-CM

## 2019-05-14 DIAGNOSIS — E1165 Type 2 diabetes mellitus with hyperglycemia: Secondary | ICD-10-CM

## 2019-05-14 LAB — BASIC METABOLIC PANEL
Anion gap: 8 (ref 5–15)
BUN: 15 mg/dL (ref 8–23)
CO2: 25 mmol/L (ref 22–32)
Calcium: 8.7 mg/dL — ABNORMAL LOW (ref 8.9–10.3)
Chloride: 101 mmol/L (ref 98–111)
Creatinine, Ser: 0.51 mg/dL (ref 0.44–1.00)
GFR calc Af Amer: >60 mL/min (ref >60)
GFR calc non Af Amer: >60 mL/min (ref >60)
Glucose, Bld: 227 mg/dL — ABNORMAL HIGH (ref 70–99)
Potassium: 5 mmol/L (ref 3.5–5.1)
Sodium: 134 mmol/L — ABNORMAL LOW (ref 135–145)

## 2019-05-14 LAB — GLUCOSE, CAPILLARY
Glucose-Capillary: 187 mg/dL — ABNORMAL HIGH (ref 70–99)
Glucose-Capillary: 215 mg/dL — ABNORMAL HIGH (ref 70–99)

## 2019-05-14 LAB — MAGNESIUM: Magnesium: 1.6 mg/dL — ABNORMAL LOW (ref 1.7–2.4)

## 2019-05-14 MED ORDER — TOUJEO SOLOSTAR 300 UNIT/ML ~~LOC~~ SOPN: 10.0000 [IU] | PEN_INJECTOR | Freq: Every day | SUBCUTANEOUS | Status: DC

## 2019-05-14 MED ORDER — LIDOCAINE 5 % EX PTCH: 1.0000 | MEDICATED_PATCH | CUTANEOUS | 1 refills | Status: DC

## 2019-05-14 MED ORDER — ADULT MULTIVITAMIN W/MINERALS CH: 1.0000 | ORAL_TABLET | Freq: Every day | ORAL | Status: DC

## 2019-05-14 MED ORDER — HEPARIN SOD (PORK) LOCK FLUSH 100 UNIT/ML IV SOLN
250.0000 [IU] | Freq: Once | INTRAVENOUS | Status: AC
  Administered 2019-05-14: 250 [IU] via INTRAVENOUS
  Filled 2019-05-14: qty 5

## 2019-05-14 MED ORDER — NICOTINE 7 MG/24HR TD PT24: 7.0000 mg | MEDICATED_PATCH | TRANSDERMAL | Status: DC

## 2019-05-14 MED ORDER — NICOTINE 7 MG/24HR TD PT24
7.0000 mg | MEDICATED_PATCH | TRANSDERMAL | Status: DC
  Administered 2019-05-14: 7 mg via TRANSDERMAL
  Filled 2019-05-14: qty 1

## 2019-05-14 MED ORDER — MAGNESIUM SULFATE 2 GM/50ML IV SOLN
2.0000 g | Freq: Once | INTRAVENOUS | Status: AC
  Administered 2019-05-14: 2 g via INTRAVENOUS
  Filled 2019-05-14: qty 50

## 2019-05-14 NOTE — Discharge Summary (Signed)
Physician Discharge Summary  Kristine Rodriguez QQP:[id number] DOB: [birth year] DOA: 05/12/2019  PCP: Sharilyn Sites, MD  Admit date: 05/12/2019 Discharge date: 05/14/2019  Admitted From: Home Disposition:  Home  Recommendations for Outpatient Follow-up:  1. Follow up with PCP in 1-2 weeks 2. Please obtain BMP/CBC in one week Please follow up on the following pending results:   Discharge Condition: Stable CODE STATUS: FULL Diet recommendation: Heart Healthy / Carb Modified /    Brief/Interim Summary:  73 y.o. female, with a past medical history of pancreatic cancer, diabetes type 2, hypertension, COPD, and tobacco abuse, presents with a feeling of listlessness.  Patient reports that she had been feeling fatigued and weak starting 05/11/19.  Patient reports that her daughter came over to see her and noticed that she was not acting like her normal self, so they decided to check her sugar.  At home her blood glucose read high on her meter.So they decided to come into the ED.   She reports that she had been feeling nausea, but this has become normal for her given her chemo treatment.  Pt reported an episode of N/v on day prior to admission.  In the emergency department, the patient was afebrile hemodynamically stable.  She was noted to have a serum glucose of 904 without anion gap or acidosis.  The patient was started glucose stabilizer and IV fluids with improvement of her glycemic control.  05/11/2020 hemoglobin A1c was 9.1. Upon further questioning, the patient was told to discontinue her Toujeo when she was discharged from the hospital on 02/08/2019.  At that time, the patient had an extensive work-up during which time she was found to have metastatic pancreatic cancer.  The patient stated that she previously took 20-30 units of Toujeo and checked her CBGs twice a day.  She stated she was also previously on metformin.  At the time of the discharge, the patient was instructed to check her CBGs twice a day  and only take Amaryl if her CBGs were greater than 150.  Nevertheless, the patient's mental status improved after control of her hyperglycemia and optimization of her electrolyte derangement.  Although her appetite remained poor, she was able to tolerate a carbohydrate modified diet without any vomiting or abdominal pain.  The patient was given instructions to restart her Toujeo 10 units daily.  She was instructed to continue checking her CBGs twice a day.  For the short-term, she was instructed to give herself Toujeo 15 units if her CBGs were consistently above 300.  In addition, if her CBGs were above 400, the patient was instructed to follow-up with her physician ASAP.  She was encouraged to follow-up with her previous endocrinologist, Dr. Dorris Fetch for further adjustment of her diabetes regimen.  After lengthy discussion with the patient's son, it was felt that given the patient's comorbidities of metastatic pancreatic cancer, that a regimen allowing for more liberal glycemic control was advocated.  Discharge Diagnoses:  Hyperosmolar Nonketotic State -patient started on IV insulin with q 1 hour CBG check and q 4 hour BMPs -pt started on aggressive fluid resuscitation -Electrolytes were monitored and repleted -transitioned to [place] insulin once anion gap closed -diet was advanced once anion gap closed -HbA1C--9.1  Uncontrolled diabetes mellitus type 2 with hyperglycemia -Restart Toujeo with the parameters discussed above -Follow-up with her endocrinologist, Dr. Dorris Fetch  Metastatic pancreatic cancer -Patient follows up with Dr. Delton Coombes -Chemotherapy is currently on hold per 05/10/2019 notes secondary to the patient's weight loss  Tobacco abuse -Tobacco  cessation discussed  COPD -Stable on room air without exacerbation  Essential hypertension -The patient was instructed to discontinue her antihypertensive medications when she was discharged from the hospital April [id number] -Systolic blood pressures  remain acceptable at this time with SBP 140s-150s -Continue to follow-up as an outpatient  Hyponatremia -Secondary to hyperglycemia -Improved   Discharge Instructions   Allergies as of 05/14/2019      Reactions   Duricef [cefadroxil] Shortness Of Breath, Swelling   Tape Rash      Medication List    STOP taking these medications   glimepiride 1 MG tablet Commonly known as: Amaryl     TAKE these medications   albuterol 108 (90 Base) MCG/ACT inhaler Commonly known as: VENTOLIN HFA Inhale 2 puffs into the lungs every 4 (four) hours as needed for wheezing or shortness of breath.   B-D ULTRAFINE III SHORT PEN 31G X 8 MM Misc Generic drug: Insulin Pen Needle USE ONCE AT BEDTIME   dronabinol 5 MG capsule Commonly known as: MARINOL Take 1 capsule (5 mg total) by mouth 2 (two) times daily before a meal.   lactulose (encephalopathy) 10 GM/15ML Soln Commonly known as: CHRONULAC Take 30 mLs (20 g total) by mouth at bedtime.   lactulose 10 GM/15ML solution Commonly known as: CHRONULAC Take 20 g by mouth at bedtime.   lidocaine 5 % Commonly known as: LIDODERM Place 1 patch onto the skin daily. Remove & Discard patch within 12 hours or as directed by MD   lidocaine-prilocaine cream Commonly known as: EMLA Apply 1 application topically as needed. 1 hour prior to stick and cover with plastic wrap   mirtazapine 30 MG tablet Commonly known as: REMERON Take 0.5 tablets (15 mg total) by mouth at bedtime. What changed: how much to take   multivitamin with minerals Tabs tablet Take 1 tablet by mouth daily.   OneTouch Ultra test strip Generic drug: glucose blood TEST TWICE DAILY   oxyCODONE-acetaminophen 5-325 MG tablet Commonly known as: PERCOCET/ROXICET Take 1-2 tablets by mouth every 6 (six) hours as needed for moderate pain or severe pain.   prochlorperazine 10 MG tablet Commonly known as: COMPAZINE TAKE 1 TABLET BY MOUTH EVERY 6 HOURS AS NEEDED FOR NAUSEA AND  VOMITING What changed: See the new instructions.   Toujeo SoloStar 300 UNIT/ML Sopn Generic drug: Insulin Glargine (1 Unit Dial) Inject 10 Units into the skin daily.       Allergies  Allergen Reactions   Duricef [Cefadroxil] Shortness Of Breath and Swelling   Tape Rash    Consultations:  none   Procedures/Studies: Ct Abdomen Pelvis W Wo Contrast  Result Date: 04/22/2019 CLINICAL DATA:  Metastatic pancreatic cancer. History of left breast cancer. EXAM: CT CHEST WITH CONTRAST CT ABDOMEN AND PELVIS WITH AND WITHOUT CONTRAST TECHNIQUE: Multidetector CT imaging of the chest was performed during intravenous contrast administration. Multidetector CT imaging of the abdomen and pelvis was performed following the standard protocol before and during bolus administration of intravenous contrast. CONTRAST:  127mL OMNIPAQUE IOHEXOL 300 MG/ML  SOLN COMPARISON:  CT chest dated 03/04/2019. CT abdomen/pelvis dated 02/04/2019. FINDINGS: CT CHEST FINDINGS Cardiovascular: The heart is normal in size. No pericardial effusion. No evidence of thoracic aortic aneurysm. Atherosclerotic calcifications of the aortic arch. Three vessel coronary atherosclerosis. Right chest port terminates in the lower SVC. Mediastinum/Nodes: No suspicious mediastinal lymphadenopathy. Visualized thyroid is unremarkable. Lungs/Pleura: Numerous bilateral pulmonary nodules/metastases, mildly improved. For example: --4 mm nodule in the left lung apex (series 3/image 17), previously  5 mm --5 mm nodule in the posterior right upper lobe (series 3/image 43), unchanged --5 mm nodule in the right lower lobe (series 3/image 64), previously 6 mm --5 mm nodule in the medial left lung base (series 3/image 111), previously 7 mm No focal consolidation. Mild centrilobular emphysematous changes, upper lung predominant. No pleural effusion or pneumothorax. Musculoskeletal: Visualized osseous structures are within normal limits. CT ABDOMEN AND PELVIS  FINDINGS Hepatobiliary: Multifocal hepatic metastases, including: --3.1 x 2.4 cm lesion an segment 8 (series 11/image 110), previously 2.6 x 2.6 cm on CT chest --2.1 x 2.0 cm lesion along the inferolateral aspect of segment 6 (series 11/image 142), previously 2.6 x 2.8 cm when measured in a similar fashion on CT chest --adjacent 2.0 x 1.6 cm lesion along the inferomedial aspect of segment 6 (series 11/image 147), previously 2.2 x 1.9 cm on CT chest While there is a mixed response, overall mild improvement is suspected. Layering subcentimeter gallstone in the gallbladder fundus (series 11/image 155). No associated inflammatory changes. No intrahepatic or extrahepatic ductal dilatation. Pancreas: 4.7 x 6.0 cm mass involving the pancreatic body/tail (series 11/image 132), previously 5.1 x 6.1 cm. Mass encases the celiac artery (series 11/image 129) and proximal SMA (series 11/image 139). Mass also involves the portal vein, described below. Mass directly abuts the anterior aspect of the left adrenal gland (series 11/image 131). Mass abuts the posterior aspect of the stomach (series 11/image 126). Spleen: Within normal limits. Adrenals/Urinary Tract: Right adrenal nodularity (series 11/image 121), equivocal. Direct tumor involvement of the left adrenal gland, as described above. Kidneys are within normal limits.  No hydronephrosis. Bladder is mildly thick-walled, particularly along the right anterolateral aspect with mucosal hyperenhancement (series 9/image 172), although poorly evaluated on CT. Stomach/Bowel: Tumor directly abuts the stomach (series 13/image 32). Moderate duodenal diverticulum. Focal wall thickening with possible serosal implants along the terminal ileum adjacent to the ileocecal valve (series 9/image 133). In retrospect, there was a masslike lesion in this region on prior unenhanced CT. Adjacent mild mesenteric nodularity (series 9/images 130, 137, and 139). Although unusual, this appearance may  reflect small bowel metastasis in this clinical context. No evidence of bowel obstruction. Appendix is not discretely visualized. Left colonic diverticulosis, without evidence of diverticulitis. Vascular/Lymphatic: No evidence of abdominal aortic aneurysm. Atherosclerotic calcifications of the abdominal aorta and branch vessels. Tumor involves the celiac artery and SMA, as described above. Tumor involves the portal vein. Improving filling defect in the main portal vein (series 11/image 133), favoring bland thrombus. Para-aortic nodal metastasis/tumor measuring up to 2.3 cm short axis (series 11/image 144), similar to the prior, abutting the left renal artery (series 11/image 142) and encasing/involving the left renal vein. Tumor also abuts the infrahepatic IVC (series 11/image 141). Additional 13 mm para-aortic node inferiorly (series 11/image 162), previously 9 mm. Reproductive: Uterus is within normal limits. Bilateral ovaries are unremarkable. Other: No abdominopelvic ascites. Musculoskeletal: Visualized osseous structures are within normal limits. 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 Tumor abuts the stomach and likely directly involves the left adrenal gland. Associated vascular involvement, as described above. Small bilateral pulmonary nodules/metastases measuring up to 5 mm, mildly improved. Mixed response of multifocal hepatic metastases, with overall mild improvement suspected. Para-aortic nodal metastases, one of which is mildly progressive. Focal wall thickening with possible serosal implants along the terminal ileum, unusual in appearance, but possibly reflecting small bowel metastasis in this clinical context. Adjacent mesenteric nodularity is mildly progressive. Electronically Signed   By:  Julian Hy M.D.   On: 04/22/2019 23:32   Ct Chest W Contrast  Result Date: 04/22/2019 CLINICAL DATA:  Metastatic pancreatic cancer. History  of left breast cancer. EXAM: CT CHEST WITH CONTRAST CT ABDOMEN AND PELVIS WITH AND WITHOUT CONTRAST TECHNIQUE: Multidetector CT imaging of the chest was performed during intravenous contrast administration. Multidetector CT imaging of the abdomen and pelvis was performed following the standard protocol before and during bolus administration of intravenous contrast. CONTRAST:  147mL OMNIPAQUE IOHEXOL 300 MG/ML  SOLN COMPARISON:  CT chest dated 03/04/2019. CT abdomen/pelvis dated 02/04/2019. FINDINGS: CT CHEST FINDINGS Cardiovascular: The heart is normal in size. No pericardial effusion. No evidence of thoracic aortic aneurysm. Atherosclerotic calcifications of the aortic arch. Three vessel coronary atherosclerosis. Right chest port terminates in the lower SVC. Mediastinum/Nodes: No suspicious mediastinal lymphadenopathy. Visualized thyroid is unremarkable. Lungs/Pleura: Numerous bilateral pulmonary nodules/metastases, mildly improved. For example: --4 mm nodule in the left lung apex (series 3/image 17), previously 5 mm --5 mm nodule in the posterior right upper lobe (series 3/image 43), unchanged --5 mm nodule in the right lower lobe (series 3/image 64), previously 6 mm --5 mm nodule in the medial left lung base (series 3/image 111), previously 7 mm No focal consolidation. Mild centrilobular emphysematous changes, upper lung predominant. No pleural effusion or pneumothorax. Musculoskeletal: Visualized osseous structures are within normal limits. CT ABDOMEN AND PELVIS FINDINGS Hepatobiliary: Multifocal hepatic metastases, including: --3.1 x 2.4 cm lesion an segment 8 (series 11/image 110), previously 2.6 x 2.6 cm on CT chest --2.1 x 2.0 cm lesion along the inferolateral aspect of segment 6 (series 11/image 142), previously 2.6 x 2.8 cm when measured in a similar fashion on CT chest --adjacent 2.0 x 1.6 cm lesion along the inferomedial aspect of segment 6 (series 11/image 147), previously 2.2 x 1.9 cm on CT chest While  there is a mixed response, overall mild improvement is suspected. Layering subcentimeter gallstone in the gallbladder fundus (series 11/image 155). No associated inflammatory changes. No intrahepatic or extrahepatic ductal dilatation. Pancreas: 4.7 x 6.0 cm mass involving the pancreatic body/tail (series 11/image 132), previously 5.1 x 6.1 cm. Mass encases the celiac artery (series 11/image 129) and proximal SMA (series 11/image 139). Mass also involves the portal vein, described below. Mass directly abuts the anterior aspect of the left adrenal gland (series 11/image 131). Mass abuts the posterior aspect of the stomach (series 11/image 126). Spleen: Within normal limits. Adrenals/Urinary Tract: Right adrenal nodularity (series 11/image 121), equivocal. Direct tumor involvement of the left adrenal gland, as described above. Kidneys are within normal limits.  No hydronephrosis. Bladder is mildly thick-walled, particularly along the right anterolateral aspect with mucosal hyperenhancement (series 9/image 172), although poorly evaluated on CT. Stomach/Bowel: Tumor directly abuts the stomach (series 13/image 32). Moderate duodenal diverticulum. Focal wall thickening with possible serosal implants along the terminal ileum adjacent to the ileocecal valve (series 9/image 133). In retrospect, there was a masslike lesion in this region on prior unenhanced CT. Adjacent mild mesenteric nodularity (series 9/images 130, 137, and 139). Although unusual, this appearance may reflect small bowel metastasis in this clinical context. No evidence of bowel obstruction. Appendix is not discretely visualized. Left colonic diverticulosis, without evidence of diverticulitis. Vascular/Lymphatic: No evidence of abdominal aortic aneurysm. Atherosclerotic calcifications of the abdominal aorta and branch vessels. Tumor involves the celiac artery and SMA, as described above. Tumor involves the portal vein. Improving filling defect in the main  portal vein (series 11/image 133), favoring bland thrombus. Para-aortic nodal metastasis/tumor measuring  up to 2.3 cm short axis (series 11/image 144), similar to the prior, abutting the left renal artery (series 11/image 142) and encasing/involving the left renal vein. Tumor also abuts the infrahepatic IVC (series 11/image 141). Additional 13 mm para-aortic node inferiorly (series 11/image 162), previously 9 mm. Reproductive: Uterus is within normal limits. Bilateral ovaries are unremarkable. Other: No abdominopelvic ascites. Musculoskeletal: Visualized osseous structures are within normal limits. 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 Tumor abuts the stomach and likely directly involves the left adrenal gland. Associated vascular involvement, as described above. Small bilateral pulmonary nodules/metastases measuring up to 5 mm, mildly improved. Mixed response of multifocal hepatic metastases, with overall mild improvement suspected. Para-aortic nodal metastases, one of which is mildly progressive. Focal wall thickening with possible serosal implants along the terminal ileum, unusual in appearance, but possibly reflecting small bowel metastasis in this clinical context. Adjacent mesenteric nodularity is mildly progressive. Electronically Signed   By: Julian Hy M.D.   On: 04/22/2019 23:32   Dg Chest Portable 1 View  Result Date: 05/12/2019 CLINICAL DATA:  Weakness, history pancreatic cancer EXAM: PORTABLE CHEST 1 VIEW COMPARISON:  Portable exam 1652 hours compared to 02/05/2019 FINDINGS: RIGHT jugular Port-A-Cath with tip projecting over SVC. Normal heart size, mediastinal contours, and pulmonary vascularity. Atherosclerotic calcification aorta. Minimal bronchitic changes. No acute infiltrate, pleural effusion, or pneumothorax. No acute osseous findings. IMPRESSION: Minimal bronchitic changes without infiltrate. Electronically Signed   By:  Lavonia Dana M.D.   On: 05/12/2019 17:10         Discharge Exam: Vitals:   05/13/19 2200 05/14/19 0600  BP: (!) 147/67 (!) 151/66  Pulse: 77 83  Resp: 16 16  Temp: 98.2 F (36.8 C) 98.7 F (37.1 C)  SpO2: 99% 100%   Vitals:   05/13/19 1133 05/13/19 1935 05/13/19 2200 05/14/19 0600  BP:   (!) 147/67 (!) 151/66  Pulse:   77 83  Resp:   16 16  Temp: 98.5 F (36.9 C)  98.2 F (36.8 C) 98.7 F (37.1 C)  TempSrc: Oral  Oral Oral  SpO2:  98% 99% 100%  Weight:      Height:        General: Pt is alert, awake, not in acute distress Cardiovascular: RRR, S1/S2 +, no rubs, no gallops Respiratory: CTA bilaterally, no wheezing, no rhonchi Abdominal: Soft, NT, ND, bowel sounds + Extremities: no edema, no cyanosis   The results of significant diagnostics from this hospitalization (including imaging, microbiology, ancillary and laboratory) are listed below for reference.    Significant Diagnostic Studies: Ct Abdomen Pelvis W Wo Contrast  Result Date: 04/22/2019 CLINICAL DATA:  Metastatic pancreatic cancer. History of left breast cancer. EXAM: CT CHEST WITH CONTRAST CT ABDOMEN AND PELVIS WITH AND WITHOUT CONTRAST TECHNIQUE: Multidetector CT imaging of the chest was performed during intravenous contrast administration. Multidetector CT imaging of the abdomen and pelvis was performed following the standard protocol before and during bolus administration of intravenous contrast. CONTRAST:  114mL OMNIPAQUE IOHEXOL 300 MG/ML  SOLN COMPARISON:  CT chest dated 03/04/2019. CT abdomen/pelvis dated 02/04/2019. FINDINGS: CT CHEST FINDINGS Cardiovascular: The heart is normal in size. No pericardial effusion. No evidence of thoracic aortic aneurysm. Atherosclerotic calcifications of the aortic arch. Three vessel coronary atherosclerosis. Right chest port terminates in the lower SVC. Mediastinum/Nodes: No suspicious mediastinal lymphadenopathy. Visualized thyroid is unremarkable. Lungs/Pleura: Numerous  bilateral pulmonary nodules/metastases, mildly improved. For example: --4 mm nodule in the left lung apex (series 3/image  17), previously 5 mm --5 mm nodule in the posterior right upper lobe (series 3/image 43), unchanged --5 mm nodule in the right lower lobe (series 3/image 64), previously 6 mm --5 mm nodule in the medial left lung base (series 3/image 111), previously 7 mm No focal consolidation. Mild centrilobular emphysematous changes, upper lung predominant. No pleural effusion or pneumothorax. Musculoskeletal: Visualized osseous structures are within normal limits. CT ABDOMEN AND PELVIS FINDINGS Hepatobiliary: Multifocal hepatic metastases, including: --3.1 x 2.4 cm lesion an segment 8 (series 11/image 110), previously 2.6 x 2.6 cm on CT chest --2.1 x 2.0 cm lesion along the inferolateral aspect of segment 6 (series 11/image 142), previously 2.6 x 2.8 cm when measured in a similar fashion on CT chest --adjacent 2.0 x 1.6 cm lesion along the inferomedial aspect of segment 6 (series 11/image 147), previously 2.2 x 1.9 cm on CT chest While there is a mixed response, overall mild improvement is suspected. Layering subcentimeter gallstone in the gallbladder fundus (series 11/image 155). No associated inflammatory changes. No intrahepatic or extrahepatic ductal dilatation. Pancreas: 4.7 x 6.0 cm mass involving the pancreatic body/tail (series 11/image 132), previously 5.1 x 6.1 cm. Mass encases the celiac artery (series 11/image 129) and proximal SMA (series 11/image 139). Mass also involves the portal vein, described below. Mass directly abuts the anterior aspect of the left adrenal gland (series 11/image 131). Mass abuts the posterior aspect of the stomach (series 11/image 126). Spleen: Within normal limits. Adrenals/Urinary Tract: Right adrenal nodularity (series 11/image 121), equivocal. Direct tumor involvement of the left adrenal gland, as described above. Kidneys are within normal limits.  No hydronephrosis.  Bladder is mildly thick-walled, particularly along the right anterolateral aspect with mucosal hyperenhancement (series 9/image 172), although poorly evaluated on CT. Stomach/Bowel: Tumor directly abuts the stomach (series 13/image 32). Moderate duodenal diverticulum. Focal wall thickening with possible serosal implants along the terminal ileum adjacent to the ileocecal valve (series 9/image 133). In retrospect, there was a masslike lesion in this region on prior unenhanced CT. Adjacent mild mesenteric nodularity (series 9/images 130, 137, and 139). Although unusual, this appearance may reflect small bowel metastasis in this clinical context. No evidence of bowel obstruction. Appendix is not discretely visualized. Left colonic diverticulosis, without evidence of diverticulitis. Vascular/Lymphatic: No evidence of abdominal aortic aneurysm. Atherosclerotic calcifications of the abdominal aorta and branch vessels. Tumor involves the celiac artery and SMA, as described above. Tumor involves the portal vein. Improving filling defect in the main portal vein (series 11/image 133), favoring bland thrombus. Para-aortic nodal metastasis/tumor measuring up to 2.3 cm short axis (series 11/image 144), similar to the prior, abutting the left renal artery (series 11/image 142) and encasing/involving the left renal vein. Tumor also abuts the infrahepatic IVC (series 11/image 141). Additional 13 mm para-aortic node inferiorly (series 11/image 162), previously 9 mm. Reproductive: Uterus is within normal limits. Bilateral ovaries are unremarkable. Other: No abdominopelvic ascites. Musculoskeletal: Visualized osseous structures are within normal limits. 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 Tumor abuts the stomach and likely directly involves the left adrenal gland. Associated vascular involvement, as described above. Small bilateral pulmonary nodules/metastases measuring  up to 5 mm, mildly improved. Mixed response of multifocal hepatic metastases, with overall mild improvement suspected. Para-aortic nodal metastases, one of which is mildly progressive. Focal wall thickening with possible serosal implants along the terminal ileum, unusual in appearance, but possibly reflecting small bowel metastasis in this clinical context. Adjacent mesenteric nodularity is mildly progressive. Electronically Signed  By: Julian Hy M.D.   On: 04/22/2019 23:32   Ct Chest W Contrast  Result Date: 04/22/2019 CLINICAL DATA:  Metastatic pancreatic cancer. History of left breast cancer. EXAM: CT CHEST WITH CONTRAST CT ABDOMEN AND PELVIS WITH AND WITHOUT CONTRAST TECHNIQUE: Multidetector CT imaging of the chest was performed during intravenous contrast administration. Multidetector CT imaging of the abdomen and pelvis was performed following the standard protocol before and during bolus administration of intravenous contrast. CONTRAST:  178mL OMNIPAQUE IOHEXOL 300 MG/ML  SOLN COMPARISON:  CT chest dated 03/04/2019. CT abdomen/pelvis dated 02/04/2019. FINDINGS: CT CHEST FINDINGS Cardiovascular: The heart is normal in size. No pericardial effusion. No evidence of thoracic aortic aneurysm. Atherosclerotic calcifications of the aortic arch. Three vessel coronary atherosclerosis. Right chest port terminates in the lower SVC. Mediastinum/Nodes: No suspicious mediastinal lymphadenopathy. Visualized thyroid is unremarkable. Lungs/Pleura: Numerous bilateral pulmonary nodules/metastases, mildly improved. For example: --4 mm nodule in the left lung apex (series 3/image 17), previously 5 mm --5 mm nodule in the posterior right upper lobe (series 3/image 43), unchanged --5 mm nodule in the right lower lobe (series 3/image 64), previously 6 mm --5 mm nodule in the medial left lung base (series 3/image 111), previously 7 mm No focal consolidation. Mild centrilobular emphysematous changes, upper lung  predominant. No pleural effusion or pneumothorax. Musculoskeletal: Visualized osseous structures are within normal limits. CT ABDOMEN AND PELVIS FINDINGS Hepatobiliary: Multifocal hepatic metastases, including: --3.1 x 2.4 cm lesion an segment 8 (series 11/image 110), previously 2.6 x 2.6 cm on CT chest --2.1 x 2.0 cm lesion along the inferolateral aspect of segment 6 (series 11/image 142), previously 2.6 x 2.8 cm when measured in a similar fashion on CT chest --adjacent 2.0 x 1.6 cm lesion along the inferomedial aspect of segment 6 (series 11/image 147), previously 2.2 x 1.9 cm on CT chest While there is a mixed response, overall mild improvement is suspected. Layering subcentimeter gallstone in the gallbladder fundus (series 11/image 155). No associated inflammatory changes. No intrahepatic or extrahepatic ductal dilatation. Pancreas: 4.7 x 6.0 cm mass involving the pancreatic body/tail (series 11/image 132), previously 5.1 x 6.1 cm. Mass encases the celiac artery (series 11/image 129) and proximal SMA (series 11/image 139). Mass also involves the portal vein, described below. Mass directly abuts the anterior aspect of the left adrenal gland (series 11/image 131). Mass abuts the posterior aspect of the stomach (series 11/image 126). Spleen: Within normal limits. Adrenals/Urinary Tract: Right adrenal nodularity (series 11/image 121), equivocal. Direct tumor involvement of the left adrenal gland, as described above. Kidneys are within normal limits.  No hydronephrosis. Bladder is mildly thick-walled, particularly along the right anterolateral aspect with mucosal hyperenhancement (series 9/image 172), although poorly evaluated on CT. Stomach/Bowel: Tumor directly abuts the stomach (series 13/image 32). Moderate duodenal diverticulum. Focal wall thickening with possible serosal implants along the terminal ileum adjacent to the ileocecal valve (series 9/image 133). In retrospect, there was a masslike lesion in this  region on prior unenhanced CT. Adjacent mild mesenteric nodularity (series 9/images 130, 137, and 139). Although unusual, this appearance may reflect small bowel metastasis in this clinical context. No evidence of bowel obstruction. Appendix is not discretely visualized. Left colonic diverticulosis, without evidence of diverticulitis. Vascular/Lymphatic: No evidence of abdominal aortic aneurysm. Atherosclerotic calcifications of the abdominal aorta and branch vessels. Tumor involves the celiac artery and SMA, as described above. Tumor involves the portal vein. Improving filling defect in the main portal vein (series 11/image 133), favoring bland thrombus. Para-aortic nodal metastasis/tumor measuring  up to 2.3 cm short axis (series 11/image 144), similar to the prior, abutting the left renal artery (series 11/image 142) and encasing/involving the left renal vein. Tumor also abuts the infrahepatic IVC (series 11/image 141). Additional 13 mm para-aortic node inferiorly (series 11/image 162), previously 9 mm. Reproductive: Uterus is within normal limits. Bilateral ovaries are unremarkable. Other: No abdominopelvic ascites. Musculoskeletal: Visualized osseous structures are within normal limits. 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 Tumor abuts the stomach and likely directly involves the left adrenal gland. Associated vascular involvement, as described above. Small bilateral pulmonary nodules/metastases measuring up to 5 mm, mildly improved. Mixed response of multifocal hepatic metastases, with overall mild improvement suspected. Para-aortic nodal metastases, one of which is mildly progressive. Focal wall thickening with possible serosal implants along the terminal ileum, unusual in appearance, but possibly reflecting small bowel metastasis in this clinical context. Adjacent mesenteric nodularity is mildly progressive. Electronically Signed   By: Julian Hy M.D.   On: 04/22/2019 23:32   Dg Chest Portable 1 View  Result Date: 05/12/2019 CLINICAL DATA:  Weakness, history pancreatic cancer EXAM: PORTABLE CHEST 1 VIEW COMPARISON:  Portable exam 1652 hours compared to 02/05/2019 FINDINGS: RIGHT jugular Port-A-Cath with tip projecting over SVC. Normal heart size, mediastinal contours, and pulmonary vascularity. Atherosclerotic calcification aorta. Minimal bronchitic changes. No acute infiltrate, pleural effusion, or pneumothorax. No acute osseous findings. IMPRESSION: Minimal bronchitic changes without infiltrate. Electronically Signed   By: Lavonia Dana M.D.   On: 05/12/2019 17:10     Microbiology: Recent Results (from the past 240 hour(s))  SARS Coronavirus 2 (CEPHEID - Performed in [place] hospital lab), Hosp Order     Status: None   Collection Time: 05/12/19  4:42 PM   Specimen: Nasopharyngeal Swab  Result Value Ref Range Status   SARS Coronavirus 2 NEGATIVE NEGATIVE Final    Comment: (NOTE) If result is NEGATIVE SARS-CoV-2 target nucleic acids are NOT DETECTED. The SARS-CoV-2 RNA is generally detectable in upper and lower  respiratory specimens during the acute phase of infection. The lowest  concentration of SARS-CoV-2 viral copies this assay can detect is 250  copies / mL. A negative result does not preclude SARS-CoV-2 infection  and should not be used as the sole basis for treatment or other  patient management decisions.  A negative result may occur with  improper specimen collection / handling, submission of specimen other  than nasopharyngeal swab, presence of viral mutation(s) within the  areas targeted by this assay, and inadequate number of viral copies  (<250 copies / mL). A negative result must be combined with clinical  observations, patient history, and epidemiological information. If result is POSITIVE SARS-CoV-2 target nucleic acids are DETECTED. The SARS-CoV-2 RNA is generally detectable in upper and lower   respiratory specimens dur ing the acute phase of infection.  Positive  results are indicative of active infection with SARS-CoV-2.  Clinical  correlation with patient history and other diagnostic information is  necessary to determine patient infection status.  Positive results do  not rule out bacterial infection or co-infection with other viruses. If result is PRESUMPTIVE POSTIVE SARS-CoV-2 nucleic acids MAY BE PRESENT.   A presumptive positive result was obtained on the submitted specimen  and confirmed on repeat testing.  While 2019 novel coronavirus  (SARS-CoV-2) nucleic acids may be present in the submitted sample  additional confirmatory testing may be necessary for epidemiological  and / or clinical management purposes  to differentiate between  SARS-CoV-2 and other Sarbecovirus currently known to infect humans.  If clinically indicated additional testing with an alternate test  methodology [id number]) is advised. The SARS-CoV-2 RNA is generally  detectable in upper and lower respiratory sp ecimens during the acute  phase of infection. The expected result is Negative. Fact Sheet for Patients:  StrictlyIdeas.no Fact Sheet for Healthcare Providers: BankingDealers.co.za This test is not yet approved or cleared by the [place] FDA and has been authorized for detection and/or diagnosis of SARS-CoV-2 by FDA under an Emergency Use Authorization (EUA).  This EUA will remain in effect (meaning this test can be used) for the duration of the COVID-19 declaration under Section 564(b)(1) of the Act, 21 U.S.C. section 360bbb-3(b)(1), unless the authorization is terminated or revoked sooner. Performed at [facility], [address], [place], [place] [address]   MRSA PCR Screening     Status: None   Collection Time: 05/13/19  9:19 AM   Specimen: Nasal Mucosa; Nasopharyngeal  Result Value Ref Range Status   MRSA by PCR NEGATIVE NEGATIVE  Final    Comment:        The GeneXpert MRSA Assay (FDA approved for NASAL specimens only), is one component of a comprehensive MRSA colonization surveillance program. It is not intended to diagnose MRSA infection nor to guide or monitor treatment for MRSA infections. Performed at [facility], [address] [place],  [address]      Labs: Basic Metabolic Panel: Recent Labs  Lab 05/10/19 0841 05/12/19 1649 05/12/19 2335 05/13/19 0340 05/14/19 0432  NA 131* 130* 140 139 134*  K 4.6 5.4* 3.9 4.3 5.0  CL 99 95* 109 108 101  CO2 21* 23 22 21* 25  GLUCOSE 316* 904* 201* 154* 227*  BUN 14 27* 21 19 15   CREATININE 0.65 0.88 0.46 0.51 0.51  CALCIUM 9.0 8.9 8.5* 8.4* 8.7*  MG  --   --   --   --  1.6*   Liver Function Tests: Recent Labs  Lab 05/10/19 0841 05/12/19 1649 05/13/19 0340  AST 25 20 21   ALT 21 21 17   ALKPHOS 205* 265* 176*  BILITOT 0.3 0.5 0.2*  PROT 6.3* 6.2* 5.6*  ALBUMIN 3.2* 3.0* 2.6*   No results for input(s): LIPASE, AMYLASE in the last 168 hours. No results for input(s): AMMONIA in the last 168 hours. CBC: Recent Labs  Lab 05/10/19 0841 05/12/19 1649 05/13/19 0340  WBC 11.3* 9.3 10.0  NEUTROABS 8.2*  --   --   HGB 11.1* 10.6* 9.8*  HCT 35.3* 34.4* 30.9*  MCV 98.1 99.7 96.3  PLT 314 293 241   Cardiac Enzymes: No results for input(s): CKTOTAL, CKMB, CKMBINDEX, TROPONINI in the last 168 hours. BNP: Invalid input(s): POCBNP CBG: Recent Labs  Lab 05/13/19 0808 05/13/19 1138 05/13/19 1614 05/13/19 2146 05/14/19 0740  GLUCAP 157* 237* 143* 154* 187*    Time coordinating discharge:  36 minutes  Signed:  Orson Eva, DO [facility] Pager: [phone] 05/14/2019, 10:44 AM

## 2019-05-15 ENCOUNTER — Encounter (HOSPITAL_COMMUNITY): Payer: Self-pay | Admitting: Emergency Medicine

## 2019-05-15 ENCOUNTER — Other Ambulatory Visit: Payer: Self-pay

## 2019-05-15 ENCOUNTER — Other Ambulatory Visit: Payer: Self-pay | Admitting: Oncology

## 2019-05-15 ENCOUNTER — Emergency Department (HOSPITAL_COMMUNITY)
Admission: EM | Admit: 2019-05-15 | Discharge: 2019-05-15 | Disposition: A | Discharge status: Home / Self Care | Payer: PPO | Attending: Emergency Medicine | Admitting: Emergency Medicine

## 2019-05-15 DIAGNOSIS — I10 Essential (primary) hypertension: Secondary | ICD-10-CM | POA: Diagnosis not present

## 2019-05-15 DIAGNOSIS — Z794 Long term (current) use of insulin: Secondary | ICD-10-CM | POA: Diagnosis not present

## 2019-05-15 DIAGNOSIS — Z79899 Other long term (current) drug therapy: Secondary | ICD-10-CM | POA: Diagnosis not present

## 2019-05-15 DIAGNOSIS — R739 Hyperglycemia, unspecified: Secondary | ICD-10-CM

## 2019-05-15 DIAGNOSIS — E1165 Type 2 diabetes mellitus with hyperglycemia: Secondary | ICD-10-CM | POA: Diagnosis not present

## 2019-05-15 DIAGNOSIS — F17210 Nicotine dependence, cigarettes, uncomplicated: Secondary | ICD-10-CM | POA: Insufficient documentation

## 2019-05-15 LAB — URINALYSIS, ROUTINE W REFLEX MICROSCOPIC
Bilirubin Urine: NEGATIVE
Glucose, UA: >=500 mg/dL — AB
Hgb urine dipstick: NEGATIVE
Ketones, ur: NEGATIVE mg/dL
Leukocytes,Ua: NEGATIVE
Nitrite: NEGATIVE
Protein, ur: NEGATIVE mg/dL
Specific Gravity, Urine: 1.021 (ref 1.005–1.030)
pH: 6 (ref 5.0–8.0)

## 2019-05-15 LAB — BASIC METABOLIC PANEL
Anion gap: 7 (ref 5–15)
BUN: 17 mg/dL (ref 8–23)
CO2: 22 mmol/L (ref 22–32)
Calcium: 8 mg/dL — ABNORMAL LOW (ref 8.9–10.3)
Chloride: 101 mmol/L (ref 98–111)
Creatinine, Ser: 0.63 mg/dL (ref 0.44–1.00)
GFR calc Af Amer: >60 mL/min (ref >60)
GFR calc non Af Amer: >60 mL/min (ref >60)
Glucose, Bld: 231 mg/dL — ABNORMAL HIGH (ref 70–99)
Potassium: 4 mmol/L (ref 3.5–5.1)
Sodium: 130 mmol/L — ABNORMAL LOW (ref 135–145)

## 2019-05-15 LAB — CBC WITH DIFFERENTIAL/PLATELET
Abs Immature Granulocytes: 0.06 10*3/uL (ref 0.00–0.07)
Basophils Absolute: 0.1 10*3/uL (ref 0.0–0.1)
Basophils Relative: 1 %
Eosinophils Absolute: 0.2 10*3/uL (ref 0.0–0.5)
Eosinophils Relative: 2 %
HCT: 37.8 % (ref 36.0–46.0)
Hemoglobin: 11.7 g/dL — ABNORMAL LOW (ref 12.0–15.0)
Immature Granulocytes: 1 %
Lymphocytes Relative: 12 %
Lymphs Abs: 1.3 10*3/uL (ref 0.7–4.0)
MCH: 30.7 pg (ref 26.0–34.0)
MCHC: 31 g/dL (ref 30.0–36.0)
MCV: 99.2 fL (ref 80.0–100.0)
Monocytes Absolute: 0.6 10*3/uL (ref 0.1–1.0)
Monocytes Relative: 6 %
Neutro Abs: 8.2 10*3/uL — ABNORMAL HIGH (ref 1.7–7.7)
Neutrophils Relative %: 78 %
Platelets: 296 10*3/uL (ref 150–400)
RBC: 3.81 MIL/uL — ABNORMAL LOW (ref 3.87–5.11)
RDW: 17.7 % — ABNORMAL HIGH (ref 11.5–15.5)
WBC: 10.4 10*3/uL (ref 4.0–10.5)
nRBC: 0 % (ref 0.0–0.2)

## 2019-05-15 LAB — COMPREHENSIVE METABOLIC PANEL
ALT: 24 U/L (ref 0–44)
AST: 34 U/L (ref 15–41)
Albumin: 2.8 g/dL — ABNORMAL LOW (ref 3.5–5.0)
Alkaline Phosphatase: 198 U/L — ABNORMAL HIGH (ref 38–126)
Anion gap: 11 (ref 5–15)
BUN: 18 mg/dL (ref 8–23)
CO2: 19 mmol/L — ABNORMAL LOW (ref 22–32)
Calcium: 8.5 mg/dL — ABNORMAL LOW (ref 8.9–10.3)
Chloride: 97 mmol/L — ABNORMAL LOW (ref 98–111)
Creatinine, Ser: 0.74 mg/dL (ref 0.44–1.00)
GFR calc Af Amer: >60 mL/min (ref >60)
GFR calc non Af Amer: >60 mL/min (ref >60)
Glucose, Bld: 369 mg/dL — ABNORMAL HIGH (ref 70–99)
Potassium: 5 mmol/L (ref 3.5–5.1)
Sodium: 127 mmol/L — ABNORMAL LOW (ref 135–145)
Total Bilirubin: 1.2 mg/dL (ref 0.3–1.2)
Total Protein: 6 g/dL — ABNORMAL LOW (ref 6.5–8.1)

## 2019-05-15 LAB — CBG MONITORING, ED: Glucose-Capillary: 276 mg/dL — ABNORMAL HIGH (ref 70–99)

## 2019-05-15 MED ORDER — HEPARIN SOD (PORK) LOCK FLUSH 100 UNIT/ML IV SOLN
500.0000 [IU] | Freq: Once | INTRAVENOUS | Status: DC
  Filled 2019-05-15: qty 5

## 2019-05-15 MED ORDER — SODIUM CHLORIDE 0.9 % IV BOLUS
1000.0000 mL | Freq: Once | INTRAVENOUS | Status: AC
  Administered 2019-05-15: 1000 mL via INTRAVENOUS

## 2019-05-15 MED ORDER — MORPHINE SULFATE (PF) 4 MG/ML IV SOLN
4.0000 mg | Freq: Once | INTRAVENOUS | Status: AC
  Administered 2019-05-15: 4 mg via INTRAVENOUS
  Filled 2019-05-15: qty 1

## 2019-05-15 NOTE — Discharge Instructions (Signed)
Please continue taking your diabetes medications as directed.  Please call your oncologist tomorrow to let them know of your visit to the emergency department.  Please make an appointment for follow-up with them later this week for reevaluation.  Please monitor your sugars closely.  Return to the emergency department for any new or worsening symptoms including persistent elevation of your blood sugars, persistent vomiting, abdominal pain, dehydration, fevers or any new or worsening symptoms.

## 2019-05-15 NOTE — ED Notes (Signed)
Pt given applesauce and is eating with assistance of son.

## 2019-05-15 NOTE — ED Provider Notes (Signed)
[facility] EMERGENCY DEPARTMENT Provider Note   CSN: [id number] Arrival date & time: 05/15/19  1330    History   Chief Complaint Chief Complaint  Patient presents with  . Hyperglycemia    CBG 422 @home     HPI Kristine Rodriguez is a 73 y.o. female.     HPI   Pt is a 73 y/o female wit ha h/o left breast CA, pancreatic CA, DM, HTN, who presents to the ED today for eval of hyperglycemia.  Of note, pt recently admitted to the hospital on 05/12/19 for hyperglycemia. She was discharged from the hospital yesterday. States when she woke up BS was 127. She ate breakfast and then took her BS again and it was 422. She is on tujeo and she took 10 units this AM. She was concerned that her BS was 422 and was advised to come to the ED.   States she feels very fatigued but that this is somewhat chronic for her since being dx with pancreatic CA. She also c/o suprapubic and right sided abd pain that she also states is chronic 2/2 her CA. She denies fevers, nausea, vomiting,  Constipation, cough, chest pain, sob, or urinary sxs. She denies polyuria or polydipsia.   Past Medical History:  Diagnosis Date  . Cancer ([place])    hx of breast cancer left  . Diabetes mellitus   . Family history of breast cancer   . Family history of pancreatic cancer   . Hypertension   . Tobacco use 02/04/2019    Patient Active Problem List   Diagnosis Date Noted  . Type 2 diabetes mellitus with hyperosmolar nonketotic hyperglycemia ([place]) 05/14/2019  . Hyperglycemia without ketosis 05/13/2019  . Hyperglycemia 05/12/2019  . Genetic testing 03/03/2019  . Personal history of breast cancer 02/24/2019  . Family history of breast cancer 02/24/2019  . Family history of pancreatic cancer   . Cancer of pancreas, body ([place]) 02/09/2019  . Goals of care, counseling/discussion 02/09/2019  . Pancreatic mass 02/05/2019  . AKI (acute kidney injury) ([place]) 02/04/2019  . Tobacco use 02/04/2019  . Urinary tract infection 02/04/2019  .  Hyponatremia 02/04/2019  . Lactic acidosis 02/04/2019  . Sepsis due to urinary tract infection ([place]) 02/04/2019  . Mixed hyperlipidemia 08/12/2017  . Uncontrolled type 2 diabetes mellitus with hyperglycemia ([place]) 08/12/2017  . Current smoker 08/12/2017  . Essential hypertension, benign 08/12/2017    Past Surgical History:  Procedure Laterality Date  . bladder tack    . COLONOSCOPY  06/04/2012   Procedure: COLONOSCOPY;  Surgeon: Jamesetta So, MD;  Location: [facility];  Service: Gastroenterology;  Laterality: N/A;  . ESOPHAGOGASTRODUODENOSCOPY  06/04/2012   Procedure: ESOPHAGOGASTRODUODENOSCOPY (EGD);  Surgeon: Jamesetta So, MD;  Location: [facility];  Service: Gastroenterology;  Laterality: N/A;  . HERNIA REPAIR    . IR IMAGING GUIDED PORT INSERTION  02/18/2019  . IR US GUIDE BX ASP/DRAIN  02/07/2019  . left mastectomy    . TONSILLECTOMY    . VESICO-VAGINAL FISTULA REPAIR       OB History    Gravida  3   Para  3   Term  3   Preterm      AB      Living        SAB      TAB      Ectopic      Multiple      Live Births  Home Medications    Prior to Admission medications   Medication Sig Start Date End Date Taking? Authorizing Provider  albuterol (VENTOLIN HFA) 108 (90 Base) MCG/ACT inhaler Inhale 2 puffs into the lungs every 4 (four) hours as needed for wheezing or shortness of breath.   Yes [provider]  B-D ULTRAFINE III SHORT PEN 31G X 8 MM MISC USE ONCE AT BEDTIME 12/06/18  Yes Nida, Marella Chimes, MD  dronabinol (MARINOL) 5 MG capsule Take 1 capsule (5 mg total) by mouth 2 (two) times daily before a meal. 03/23/19  Yes Derek Jack, MD  Insulin Glargine, 1 Unit Dial, (TOUJEO SOLOSTAR) 300 UNIT/ML SOPN Inject 10 Units into the skin daily. 05/14/19  Yes Tat, Shanon Brow, MD  lactulose, encephalopathy, (CHRONULAC) 10 GM/15ML SOLN Take 30 mLs (20 g total) by mouth at bedtime. 05/02/19  Yes Derek Jack, MD   lidocaine-prilocaine (EMLA) cream Apply 1 application topically as needed. 1 hour prior to stick and cover with plastic wrap 02/15/19  Yes Ladell Pier, MD  mirtazapine (REMERON) 30 MG tablet Take 0.5 tablets (15 mg total) by mouth at bedtime. Patient taking differently: Take 30 mg by mouth at bedtime.  04/26/19  Yes Derek Jack, MD  [facility] ULTRA test strip TEST TWICE DAILY 04/18/19  Yes Cassandria Anger, MD  oxyCODONE-acetaminophen (PERCOCET/ROXICET) 5-325 MG tablet Take 1-2 tablets by mouth every 6 (six) hours as needed for moderate pain or severe pain. 04/26/19  Yes Derek Jack, MD  prochlorperazine (COMPAZINE) 10 MG tablet TAKE 1 TABLET BY MOUTH EVERY 6 HOURS AS NEEDED FOR NAUSEA AND VOMITING Patient taking differently: Take 10 mg by mouth every 6 (six) hours as needed for nausea or vomiting.  04/18/19  Yes Derek Jack, MD  lidocaine (LIDODERM) 5 % Place 1 patch onto the skin daily. Remove & Discard patch within 12 hours or as directed by MD Patient not taking: Reported on 05/15/2019 05/14/19   Orson Eva, MD    Family History Family History  Problem Relation Age of Onset  . Hypertension Mother   . Pancreatic cancer Mother 23  . Diabetes Father   . Hypertension Father   . Liver cancer Father   . Pancreatitis Sister   . Breast cancer Sister 95  . Aortic dissection Sister   . Aortic dissection Brother   . Liver cancer Paternal Grandfather     Social History Social History   Tobacco Use  . Smoking status: Current Every Day Smoker    Packs/day: 0.50    Types: Cigarettes  . Smokeless tobacco: Never Used  Substance Use Topics  . Alcohol use: Yes    Comment: socially drink  . Drug use: No     Allergies   Duricef [cefadroxil] and Tape   Review of Systems Review of Systems  Constitutional: Positive for fatigue. Negative for chills and fever.  HENT: Positive for congestion. Negative for ear pain and sore throat.   Eyes: Negative for visual  disturbance.  Respiratory: Negative for cough and shortness of breath.   Cardiovascular: Negative for chest pain.  Gastrointestinal: Positive for abdominal pain. Negative for constipation, nausea and vomiting.  Endocrine: Negative for polydipsia and polyuria.       Hyperglycemia  Genitourinary: Negative for dysuria and hematuria.  Musculoskeletal: Negative for back pain.  Skin: Negative for color change and rash.  Neurological: Negative for headaches.  All other systems reviewed and are negative.    Physical Exam Updated Vital Signs BP 102/68   Pulse 90  Temp 97.6 F (36.4 C) (Oral)   Resp 16   Ht 5\' 1"  (1.549 m)   Wt 45.4 kg   SpO2 99%   BMI 18.89 kg/m   Physical Exam Vitals signs and nursing note reviewed.  Constitutional:      General: She is not in acute distress.    Appearance: She is well-developed.     Comments: Chronically ill appearing but nontoxic  HENT:     Head: Normocephalic and atraumatic.     Mouth/Throat:     Mouth: Mucous membranes are dry.  Eyes:     Conjunctiva/sclera: Conjunctivae normal.  Neck:     Musculoskeletal: Neck supple.  Cardiovascular:     Rate and Rhythm: Regular rhythm. Tachycardia present.     Pulses: Normal pulses.     Heart sounds: Normal heart sounds. No murmur.  Pulmonary:     Effort: Pulmonary effort is normal. No respiratory distress.     Breath sounds: Normal breath sounds. No wheezing, rhonchi or rales.  Abdominal:     General: Bowel sounds are normal.     Palpations: Abdomen is soft.     Tenderness: There is abdominal tenderness in the right upper quadrant, right lower quadrant and suprapubic area. There is no guarding or rebound.  Skin:    General: Skin is warm and dry.  Neurological:     Mental Status: She is alert.      ED Treatments / Results  Labs (all labs ordered are listed, but only abnormal results are displayed) Labs Reviewed  CBC WITH DIFFERENTIAL/PLATELET - Abnormal; Notable for the following  components:      Result Value   RBC 3.81 (*)    Hemoglobin 11.7 (*)    RDW 17.7 (*)    Neutro Abs 8.2 (*)    All other components within normal limits  URINALYSIS, ROUTINE W REFLEX MICROSCOPIC - Abnormal; Notable for the following components:   APPearance HAZY (*)    Glucose, UA >=500 (*)    Bacteria, UA RARE (*)    All other components within normal limits  COMPREHENSIVE METABOLIC PANEL - Abnormal; Notable for the following components:   Sodium 127 (*)    Chloride 97 (*)    CO2 19 (*)    Glucose, Bld 369 (*)    Calcium 8.5 (*)    Total Protein 6.0 (*)    Albumin 2.8 (*)    Alkaline Phosphatase 198 (*)    All other components within normal limits  BASIC METABOLIC PANEL - Abnormal; Notable for the following components:   Sodium 130 (*)    Glucose, Bld 231 (*)    Calcium 8.0 (*)    All other components within normal limits  CBG MONITORING, ED - Abnormal; Notable for the following components:   Glucose-Capillary 276 (*)    All other components within normal limits  CBG MONITORING, ED    EKG EKG Interpretation  Date/Time:  Sunday May 15 2019 15:26:49 EDT Ventricular Rate:  81 PR Interval:    QRS Duration: 126 QT Interval:  421 QTC Calculation: 489 R Axis:   -60 Text Interpretation:  Sinus rhythm RBBB and LAFB Probable left ventricular hypertrophy since last tracing no significant change Confirmed by Noemi Chapel [phone]) on 05/15/2019 3:32:03 PM   Radiology No results found.  Procedures Procedures (including critical care time)  Medications Ordered in ED Medications  sodium chloride 0.9 % bolus 1,000 mL (0 mLs Intravenous Stopped 05/15/19 1546)  morphine 4 MG/ML injection 4 mg (  4 mg Intravenous Given 05/15/19 1534)     Initial Impression / Assessment and Plan / ED Course  I have reviewed the triage vital signs and the nursing notes.  Pertinent labs & imaging results that were available during my care of the patient were reviewed by me and considered in my  medical decision making (see chart for details).   Final Clinical Impressions(s) / ED Diagnoses   Final diagnoses:  Hyperglycemia    73 y/o female with pancreatic CA and DM presenting for eval of hyperglycemia. Noted BS of 422 today. Took 10 units of tujeo. Has been feeling fatigued. No fevers or other infectious sxs.  Pt afebrile on arrival. Marginally tachycadic, but otherwise VS WNL.   CBC with improvement of her anemia. No leukocytosis.  CMP with low bicarb at 19, but no elevated anion gap.  Repeat BMP with normal bicarb, has persistent pseudohyponatremia, corrected sodium is 133 UA with glucosuria and rare bacteria. No ketonuria.  Pt given IVF and has been able to eat two applesauce's and drink two cups of water. Her BS has improved to 231 and she has no signs of DKA today. Sxs and labs also do not reflect HHS. She appears to be stable for discharge at this time. Have advised to f/u with her oncologist and monitors BS closely. Have advised on specific return precautions. She and her son at bedside voice understanding of the plan and reasons to return. All questions answered, pt stable for discharge.   ED Discharge Orders    None       Bishop Dublin 05/15/19 Faylene Kurtz, MD 05/17/19 1536

## 2019-05-15 NOTE — ED Triage Notes (Signed)
Released yesterday from [facility] for hyperglycemia.  Instructed to come to ED by PCP if glucose greater than 400.  CBG at home 422.  C/o back pain 8/10.

## 2019-05-15 NOTE — ED Notes (Signed)
Pt wheeled to waiting room. Pt verbalized understanding of discharge instructions.   

## 2019-05-15 NOTE — ED Notes (Signed)
Pt's son called and said pt's blood sugar is 422.  Advised son that ER can't give advice over the phone without evaluating pt but that he could call pt's pcp or oncologist on call to see if there is any advice they can give over the phone.  Advised family we would be glad to evaluate and treat pt in the ED if she wants to be seen.

## 2019-05-16 ENCOUNTER — Inpatient Hospital Stay (HOSPITAL_COMMUNITY): Payer: PPO

## 2019-05-16 ENCOUNTER — Inpatient Hospital Stay (HOSPITAL_BASED_OUTPATIENT_CLINIC_OR_DEPARTMENT_OTHER): Payer: PPO | Admitting: Hematology

## 2019-05-16 ENCOUNTER — Inpatient Hospital Stay (HOSPITAL_COMMUNITY): Payer: PPO | Attending: Genetic Counselor

## 2019-05-16 ENCOUNTER — Other Ambulatory Visit (HOSPITAL_COMMUNITY): Payer: Self-pay | Admitting: *Deleted

## 2019-05-16 ENCOUNTER — Encounter: Payer: Self-pay | Admitting: Internal Medicine

## 2019-05-16 ENCOUNTER — Encounter (HOSPITAL_COMMUNITY): Payer: Self-pay | Admitting: Hematology

## 2019-05-16 DIAGNOSIS — C251 Malignant neoplasm of body of pancreas: Secondary | ICD-10-CM | POA: Diagnosis not present

## 2019-05-16 DIAGNOSIS — M549 Dorsalgia, unspecified: Secondary | ICD-10-CM | POA: Insufficient documentation

## 2019-05-16 DIAGNOSIS — C787 Secondary malignant neoplasm of liver and intrahepatic bile duct: Secondary | ICD-10-CM | POA: Insufficient documentation

## 2019-05-16 DIAGNOSIS — K5900 Constipation, unspecified: Secondary | ICD-10-CM | POA: Insufficient documentation

## 2019-05-16 DIAGNOSIS — R634 Abnormal weight loss: Secondary | ICD-10-CM | POA: Diagnosis not present

## 2019-05-16 DIAGNOSIS — E119 Type 2 diabetes mellitus without complications: Secondary | ICD-10-CM | POA: Diagnosis not present

## 2019-05-16 DIAGNOSIS — R531 Weakness: Secondary | ICD-10-CM | POA: Diagnosis not present

## 2019-05-16 DIAGNOSIS — R112 Nausea with vomiting, unspecified: Secondary | ICD-10-CM | POA: Insufficient documentation

## 2019-05-16 LAB — COMPREHENSIVE METABOLIC PANEL
ALT: 21 U/L (ref 0–44)
AST: 21 U/L (ref 15–41)
Albumin: 2.8 g/dL — ABNORMAL LOW (ref 3.5–5.0)
Alkaline Phosphatase: 170 U/L — ABNORMAL HIGH (ref 38–126)
Anion gap: 8 (ref 5–15)
BUN: 15 mg/dL (ref 8–23)
CO2: 22 mmol/L (ref 22–32)
Calcium: 8.5 mg/dL — ABNORMAL LOW (ref 8.9–10.3)
Chloride: 100 mmol/L (ref 98–111)
Creatinine, Ser: 0.58 mg/dL (ref 0.44–1.00)
GFR calc Af Amer: >60 mL/min (ref >60)
GFR calc non Af Amer: >60 mL/min (ref >60)
Glucose, Bld: 168 mg/dL — ABNORMAL HIGH (ref 70–99)
Potassium: 4.2 mmol/L (ref 3.5–5.1)
Sodium: 130 mmol/L — ABNORMAL LOW (ref 135–145)
Total Bilirubin: 0.4 mg/dL (ref 0.3–1.2)
Total Protein: 5.8 g/dL — ABNORMAL LOW (ref 6.5–8.1)

## 2019-05-16 LAB — CBC WITH DIFFERENTIAL/PLATELET
Abs Immature Granulocytes: 0.05 10*3/uL (ref 0.00–0.07)
Basophils Absolute: 0 10*3/uL (ref 0.0–0.1)
Basophils Relative: 1 %
Eosinophils Absolute: 0.2 10*3/uL (ref 0.0–0.5)
Eosinophils Relative: 2 %
HCT: 33.8 % — ABNORMAL LOW (ref 36.0–46.0)
Hemoglobin: 10.8 g/dL — ABNORMAL LOW (ref 12.0–15.0)
Immature Granulocytes: 1 %
Lymphocytes Relative: 12 %
Lymphs Abs: 1 10*3/uL (ref 0.7–4.0)
MCH: 30.6 pg (ref 26.0–34.0)
MCHC: 32 g/dL (ref 30.0–36.0)
MCV: 95.8 fL (ref 80.0–100.0)
Monocytes Absolute: 0.6 10*3/uL (ref 0.1–1.0)
Monocytes Relative: 7 %
Neutro Abs: 6.6 10*3/uL (ref 1.7–7.7)
Neutrophils Relative %: 77 %
Platelets: 234 10*3/uL (ref 150–400)
RBC: 3.53 MIL/uL — ABNORMAL LOW (ref 3.87–5.11)
RDW: 17.5 % — ABNORMAL HIGH (ref 11.5–15.5)
WBC: 8.6 10*3/uL (ref 4.0–10.5)
nRBC: 0 % (ref 0.0–0.2)

## 2019-05-16 LAB — CBG MONITORING, ED: Glucose-Capillary: 359 mg/dL — ABNORMAL HIGH (ref 70–99)

## 2019-05-16 MED ORDER — SODIUM CHLORIDE 0.9% FLUSH
10.0000 mL | INTRAVENOUS | Status: DC | PRN
  Administered 2019-05-16: 10 mL via INTRAVENOUS
  Filled 2019-05-16: qty 10

## 2019-05-16 MED ORDER — SODIUM CHLORIDE 0.9 % IV SOLN
INTRAVENOUS | Status: AC
  Administered 2019-05-16: 12:00:00 via INTRAVENOUS

## 2019-05-16 MED ORDER — ALTEPLASE 2 MG IJ SOLR
2.0000 mg | Freq: Once | INTRAMUSCULAR | Status: AC
  Administered 2019-05-16: 11:00:00 2 mg

## 2019-05-16 MED ORDER — OXYCODONE-ACETAMINOPHEN 5-325 MG PO TABS: 1.0000 | ORAL_TABLET | Freq: Four times a day (QID) | ORAL | 0 refills | Status: DC | PRN

## 2019-05-16 MED ORDER — HEPARIN SOD (PORK) LOCK FLUSH 100 UNIT/ML IV SOLN
500.0000 [IU] | Freq: Once | INTRAVENOUS | Status: AC
  Administered 2019-05-16: 500 [IU] via INTRAVENOUS

## 2019-05-16 MED ORDER — STERILE WATER FOR INJECTION IJ SOLN
INTRAMUSCULAR | Status: AC
  Filled 2019-05-16: qty 10

## 2019-05-16 MED ORDER — ALTEPLASE 2 MG IJ SOLR
INTRAMUSCULAR | Status: AC
  Filled 2019-05-16: qty 2

## 2019-05-16 NOTE — Progress Notes (Signed)
1210 Labs reviewed with Dr. Delton Coombes and chemo tx to be held with only IV hydration with NS to be given per MD      Delray Alt tolerated IV hydration well without complaints or incident. VSS upon discharge. Pt discharged via wheelchair in satisfactory condition

## 2019-05-16 NOTE — Patient Instructions (Signed)
[facility] at [facility] Discharge Instructions  Labs drawn from portacath today   Thank you for choosing  Cancer Center at [facility] to provide your oncology and hematology care.  To afford each patient quality time with our provider, please arrive at least 15 minutes before your scheduled appointment time.   If you have a lab appointment with the Cancer Center please come in thru the  Main Entrance and check in at the main information desk  You need to re-schedule your appointment should you arrive 10 or more minutes late.  We strive to give you quality time with our providers, and arriving late affects you and other patients whose appointments are after yours.  Also, if you no show three or more times for appointments you may be dismissed from the clinic at the providers discretion.     Again, thank you for choosing [facility].  Our hope is that these requests will decrease the amount of time that you wait before being seen by our physicians.       _____________________________________________________________  Should you have questions after your visit to [facility], please contact our office at [phone] between the hours of 8:00 a.m. and 4:30 p.m.  Voicemails left after 4:00 p.m. will not be returned until the following business day.  For prescription refill requests, have your pharmacy contact our office and allow 72 hours.    Cancer Center Support Programs:   > Cancer Support Group  2nd Tuesday of the month 1pm-2pm, Journey Room   

## 2019-05-16 NOTE — Patient Instructions (Signed)
[facility] at [facility] Discharge Instructions  You were seen today by Dr. Katragadda. He went over your recent lab results. He will see you back in 1 week for labs and follow up.   Thank you for choosing [facility] at [facility] to provide your oncology and hematology care.  To afford each patient quality time with our provider, please arrive at least 15 minutes before your scheduled appointment time.   If you have a lab appointment with the Cancer Center please come in thru the  Main Entrance and check in at the main information desk  You need to re-schedule your appointment should you arrive 10 or more minutes late.  We strive to give you quality time with our providers, and arriving late affects you and other patients whose appointments are after yours.  Also, if you no show three or more times for appointments you may be dismissed from the clinic at the providers discretion.     Again, thank you for choosing [facility].  Our hope is that these requests will decrease the amount of time that you wait before being seen by our physicians.       _____________________________________________________________  Should you have questions after your visit to [facility], please contact our office at [phone] between the hours of 8:00 a.m. and 4:30 p.m.  Voicemails left after 4:00 p.m. will not be returned until the following business day.  For prescription refill requests, have your pharmacy contact our office and allow 72 hours.    Cancer Center Support Programs:   > Cancer Support Group  2nd Tuesday of the month 1pm-2pm, Journey Room    

## 2019-05-16 NOTE — Assessment & Plan Note (Addendum)
1.  Metastatic pancreatic cancer to the liver and lungs: - Foundation 1 CDX shows MS-stable, K-ras G 12 D - Cycle 1 of FOLFOX on 02/21/2019, cycle 2 of FOLFIRINOX on 03/09/2019, cycle 3 on 04/05/2019 with 30% dose reduction. - CT CAP on 04/22/2019 shows improvement in size of the pancreatic mass, bilateral pulmonary nodules and liver metastasis.  Tumor marker improved from 32000-18000. - Germline mutation testing on 02/24/2019 was negative. - We were holding chemotherapy secondary to weight loss. -She was admitted to the hospital from 05/12/2019 through 05/14/2019 with hyperosmolar nonketotic state.  Toujeo was restarted. - She is continuing to be weak at this time.  I would hold her chemotherapy today.  We will give her hydration. -We will reevaluate her in 1 week.  2.  Weight loss: -She was drinking up to 5 cans of Ensure.  Until last visit she was losing weight.  She is continuing Remeron 30 mg at bedtime.  She is also taking Marinol 5 mg twice daily. -She gained about 2 pounds since last visit 1 week ago.  However she has not had any Ensure since Saturday because of her worsening diabetes.  She could not tolerate Glucerna.  I will make a referral to our dietitian for optimal weight management.  3.  Abdominal and back pain: -She is taking Percocet 1-2 tablets every 6 hours which is controlling pain.  4.  Constipation: - She will take stool softener and lactulose as needed.

## 2019-05-16 NOTE — Progress Notes (Signed)
Kristine Rodriguez, [place] [address]   CLINIC:  Medical Oncology/Hematology  PCP:  Kristine Rodriguez, [place] [address] [phone]   REASON FOR VISIT: Follow-up for metastatic pancreatic cancer   BRIEF ONCOLOGIC HISTORY:  Oncology History  Cancer of pancreas, body ([place])  02/09/2019 Initial Diagnosis   Cancer of pancreas, body ([place])   02/21/2019 -  Chemotherapy   The patient had palonosetron (ALOXI) injection 0.25 mg, 0.25 mg, Intravenous,  Once, 3 of 4 cycles Administration: 0.25 mg (02/21/2019), 0.25 mg (03/09/2019), 0.25 mg (04/04/2019) pegfilgrastim (NEULASTA) injection 6 mg, 6 mg, Subcutaneous, Once, 1 of 1 cycle Administration: 6 mg (04/06/2019) pegfilgrastim-jmdb (FULPHILA) injection 6 mg, 6 mg, Subcutaneous,  Once, 0 of 1 cycle irinotecan (CAMPTOSAR) 240 mg in sodium chloride 0.9 % 500 mL chemo infusion, 150 mg/m2 = 240 mg (100 % of original dose 150 mg/m2), Intravenous,  Once, 2 of 3 cycles Dose modification: 150 mg/m2 (original dose 150 mg/m2, Cycle 2, Reason: Provider Judgment), 105 mg/m2 (70 % of original dose 150 mg/m2, Cycle 3, Reason: Other (see comments), Comment: weight loss) Administration: 240 mg (03/09/2019), 160 mg (04/04/2019) leucovorin 620 mg in dextrose 5 % 250 mL infusion, 400 mg/m2 = 620 mg, Intravenous,  Once, 3 of 4 cycles Dose modification: 280 mg/m2 (70 % of original dose 400 mg/m2, Cycle 3, Reason: Other (see comments), Comment: weight loss) Administration: 620 mg (02/21/2019), 620 mg (03/09/2019), 434 mg (04/04/2019) oxaliplatin (ELOXATIN) 130 mg in dextrose 5 % 500 mL chemo infusion, 85 mg/m2 = 130 mg, Intravenous,  Once, 3 of 4 cycles Dose modification: 59.5 mg/m2 (70 % of original dose 85 mg/m2, Cycle 3, Reason: Other (see comments), Comment: weight loss) Administration: 130 mg (02/21/2019), 130 mg (03/09/2019), 90 mg (04/04/2019) fluorouracil (ADRUCIL) 3,700 mg in sodium chloride 0.9 % 76 mL chemo  infusion, 2,400 mg/m2 = 3,700 mg, Intravenous, 1 Day/Dose, 3 of 4 cycles Dose modification: 1,680 mg/m2 (70 % of original dose 2,400 mg/m2, Cycle 3, Reason: Other (see comments), Comment: weight loss) Administration: 3,700 mg (02/21/2019), 3,700 mg (03/09/2019), 2,600 mg (04/04/2019)  for chemotherapy treatment.    03/02/2019 Genetic Testing   Negative genetic testing on the common hereditary cancer panel+pancreatic cancer panel+preliminary evidence genes for pancreatic cancer+chronic pancratitis genes.  This Gene Panel offered by [facility] includes sequencing and/or deletion duplication testing of the following 55 genes: APC, ATM, AXIN2, BARD1, BMPR1A, BRCA1, BRCA2, BRIP1, CASR, CDH1, CDK4, CDKN2A (p14ARF), CDKN2A (p16INK4a), CFTR, CHEK2, CPA1, CTNNA1, CTRC, DICER1, EPCAM (Deletion/duplication testing only), FANCC, GREM1 (promoter region deletion/duplication testing only), KIT, MEN1, MLH1, MSH2, MSH3, MSH6, MUTYH, NBN, NF1, NHTL1, PALB2, PALLD, PDGFRA, PMS2, POLD1, POLE,PRSS1, PTEN, RAD50, RAD51C, RAD51D, SDHB, SDHC, SDHD, SMAD4, SMARCA4. SPINK1, STK11, TP53, TSC1, TSC2, and VHL.  The following genes were evaluated for sequence changes only: SDHA and HOXB13 c.251G>A variant only. The report date is Mar 02, 2019.     INTERVAL HISTORY:  Kristine Rodriguez 73 y.o. female seen for follow-up of metastatic pancreatic cancer.  Last chemotherapy was on 04/05/2019.  She was hospitalized from 7/30- 05/14/2019 for hyperosmolar nonketotic state.  She has not had any Ensure since Saturday.  She could not tolerate Glucerna.  She is back on Toujeo 10 units daily.  She has some mild cough.  Appetite is still low.  Energy levels are low.  She is taking Marinol and Remeron for appetite which is not helping much.  She gained 2 pounds from last week.  Denies any  fevers or chills.  REVIEW OF SYSTEMS:  Review of Systems  Constitutional: Positive for fatigue.  Respiratory: Positive for cough.   All other systems reviewed and are negative.     PAST MEDICAL/SURGICAL HISTORY:  Past Medical History:  Diagnosis Date  . Cancer ([place])    hx of breast cancer left  . Diabetes mellitus   . Family history of breast cancer   . Family history of pancreatic cancer   . Hypertension   . Tobacco use 02/04/2019   Past Surgical History:  Procedure Laterality Date  . bladder tack    . COLONOSCOPY  06/04/2012   Procedure: COLONOSCOPY;  Surgeon: Jamesetta So, MD;  Location: [facility];  Service: Gastroenterology;  Laterality: N/A;  . ESOPHAGOGASTRODUODENOSCOPY  06/04/2012   Procedure: ESOPHAGOGASTRODUODENOSCOPY (EGD);  Surgeon: Jamesetta So, MD;  Location: [facility];  Service: Gastroenterology;  Laterality: N/A;  . HERNIA REPAIR    . IR IMAGING GUIDED PORT INSERTION  02/18/2019  . IR US GUIDE BX ASP/DRAIN  02/07/2019  . left mastectomy    . TONSILLECTOMY    . VESICO-VAGINAL FISTULA REPAIR       SOCIAL HISTORY:  Social History   Socioeconomic History  . Marital status: Divorced    Spouse name: Not on file  . Number of children: 3  . Years of education: Not on file  . Highest education level: Not on file  Occupational History  . Occupation: retired  Scientific laboratory technician  . Financial resource strain: Not hard at all  . Food insecurity    Worry: Never true    Inability: Never true  . Transportation needs    Medical: No    Non-medical: No  Tobacco Use  . Smoking status: Current Every Day Smoker    Packs/day: 0.50    Types: Cigarettes  . Smokeless tobacco: Never Used  Substance and Sexual Activity  . Alcohol use: Yes    Comment: socially drink  . Drug use: No  . Sexual activity: Yes  Lifestyle  . Physical activity    Days per week: 0 days    Minutes per session: 0 min  . Stress: Only a little  Relationships  . Social Herbalist on phone: Twice a week    Gets together: Twice a week    Attends religious service: Never    Active member of club or organization: No    Attends meetings of clubs or  organizations: Never    Relationship status: Not on file  . Intimate partner violence    Fear of current or ex partner: No    Emotionally abused: No    Physically abused: No    Forced sexual activity: No  Other Topics Concern  . Not on file  Social History Narrative  . Not on file    FAMILY HISTORY:  Family History  Problem Relation Age of Onset  . Hypertension Mother   . Pancreatic cancer Mother 75  . Diabetes Father   . Hypertension Father   . Liver cancer Father   . Pancreatitis Sister   . Breast cancer Sister 72  . Aortic dissection Sister   . Aortic dissection Brother   . Liver cancer Paternal Grandfather     CURRENT MEDICATIONS:  Outpatient Encounter Medications as of 05/16/2019  Medication Sig Note  . albuterol (VENTOLIN HFA) 108 (90 Base) MCG/ACT inhaler Inhale 2 puffs into the lungs every 4 (four) hours as needed for wheezing or shortness of breath.   Marland Kitchen  dronabinol (MARINOL) 5 MG capsule Take 1 capsule (5 mg total) by mouth 2 (two) times daily before a meal.   . Insulin Glargine, 1 Unit Dial, (TOUJEO SOLOSTAR) 300 UNIT/ML SOPN Inject 10 Units into the skin daily. 05/15/2019: Patient took 10 units at 0700 on 05/15/19  . lactulose, encephalopathy, (CHRONULAC) 10 GM/15ML SOLN Take 30 mLs (20 g total) by mouth at bedtime.   . lidocaine-prilocaine (EMLA) cream Apply 1 application topically as needed. 1 hour prior to stick and cover with plastic wrap   . mirtazapine (REMERON) 30 MG tablet Take 0.5 tablets (15 mg total) by mouth at bedtime. (Patient taking differently: Take 30 mg by mouth at bedtime. )   . ONETOUCH ULTRA test strip TEST TWICE DAILY   . prochlorperazine (COMPAZINE) 10 MG tablet TAKE 1 TABLET BY MOUTH EVERY 6 HOURS AS NEEDED FOR NAUSEA AND VOMITING (Patient taking differently: Take 10 mg by mouth every 6 (six) hours as needed for nausea or vomiting. )   . [DISCONTINUED] oxyCODONE-acetaminophen (PERCOCET/ROXICET) 5-325 MG tablet Take 1-2 tablets by mouth every 6  (six) hours as needed for moderate pain or severe pain. 05/15/2019: Patient took 2 tablets at 1030 on 05/15/19  . B-D ULTRAFINE III SHORT PEN 31G X 8 MM MISC USE ONCE AT BEDTIME   . lidocaine (LIDODERM) 5 % Place 1 patch onto the skin daily. Remove & Discard patch within 12 hours or as directed by MD (Patient not taking: Reported on 05/15/2019) 05/15/2019: Patient's current prescription has run out and no longer has any patches at home   No facility-administered encounter medications on file as of 05/16/2019.     ALLERGIES:  Allergies  Allergen Reactions  . Duricef [Cefadroxil] Shortness Of Breath and Swelling  . Tape Rash     PHYSICAL EXAM:  ECOG Performance status: 1  Vitals:   05/16/19 0942  BP: 108/60  Pulse: 96  Resp: 16  Temp: 97.7 F (36.5 C)  SpO2: 100%   Filed Weights   05/16/19 0942  Weight: 102 lb 6.4 oz (46.4 kg)    Physical Exam Constitutional:      Appearance: Normal appearance. She is normal weight.  Cardiovascular:     Rate and Rhythm: Normal rate and regular rhythm.     Heart sounds: Normal heart sounds.  Pulmonary:     Effort: Pulmonary effort is normal.     Breath sounds: Normal breath sounds.  Abdominal:     General: Bowel sounds are normal.     Palpations: Abdomen is soft.  Musculoskeletal: Normal range of motion.  Skin:    General: Skin is warm and dry.  Neurological:     Mental Status: She is alert and oriented to person, place, and time. Mental status is at baseline.  Psychiatric:        Mood and Affect: Mood normal.        Behavior: Behavior normal.        Thought Content: Thought content normal.        Judgment: Judgment normal.      LABORATORY DATA:  I have reviewed the labs as listed.  CBC    Component Value Date/Time   WBC 8.6 05/16/2019 0943   RBC 3.53 (L) 05/16/2019 0943   HGB 10.8 (L) 05/16/2019 0943   HGB 10.5 (L) 02/21/2019 1153   HCT 33.8 (L) 05/16/2019 0943   PLT 234 05/16/2019 0943   PLT 345 02/21/2019 1153   MCV 95.8  05/16/2019 0943   MCH 30.6 05/16/2019  0943   MCHC 32.0 05/16/2019 0943   RDW 17.5 (H) 05/16/2019 0943   LYMPHSABS 1.0 05/16/2019 0943   MONOABS 0.6 05/16/2019 0943   EOSABS 0.2 05/16/2019 0943   BASOSABS 0.0 05/16/2019 0943   CMP Latest Ref Rng & Units 05/16/2019 05/15/2019 05/15/2019  Glucose 70 - 99 mg/dL 168(H) 231(H) 369(H)  BUN 8 - 23 mg/dL _0 Creatinine 0.44 - 1.00 mg/dL 0.58 0.63 0.74  Sodium 135 - 145 mmol/L 130(L) 130(L) 127(L)  Potassium 3.5 - 5.1 mmol/L 4.2 4.0 5.0  Chloride 98 - 111 mmol/L 100 101 97(L)  CO2 22 - 32 mmol/L 22 22 19(L)  Calcium 8.9 - 10.3 mg/dL 8.5(L) 8.0(L) 8.5(L)  Total Protein 6.5 - 8.1 g/dL 5.8(L) - 6.0(L)  Total Bilirubin 0.3 - 1.2 mg/dL 0.4 - 1.2  Alkaline Phos 38 - 126 U/L 170(H) - 198(H)  AST 15 - 41 U/L 21 - 34  ALT 0 - 44 U/L 21 - 24   I have independently reviewed scans and discussed with her.  ASSESSMENT & PLAN:   Cancer of pancreas, body ([place]) 1.  Metastatic pancreatic cancer to the liver and lungs: - Foundation 1 CDX shows MS-stable, K-ras G 12 D - Cycle 1 of FOLFOX on 02/21/2019, cycle 2 of FOLFIRINOX on 03/09/2019, cycle 3 on 04/05/2019 with 30% dose reduction. - CT CAP on 04/22/2019 shows improvement in size of the pancreatic mass, bilateral pulmonary nodules and liver metastasis.  Tumor marker improved from 32000-18000. - Germline mutation testing on 02/24/2019 was negative. - We were holding chemotherapy secondary to weight loss. -She was admitted to the hospital from 05/12/2019 through 05/14/2019 with hyperosmolar nonketotic state.  Toujeo was restarted. - She is continuing to be weak at this time.  I would hold her chemotherapy today.  We will give her hydration. -We will reevaluate her in 1 week.  2.  Weight loss: -She was drinking up to 5 cans of Ensure.  Until last visit she was losing weight. -She gained about 2 pounds since last visit 1 week ago.  However she has not had any Ensure since Saturday because of her worsening  diabetes.  She could not tolerate Glucerna.  I will make a referral to our dietitian for optimal weight management.  3.  Abdominal and back pain: -She is taking Percocet 1-2 tablets every 6 hours which is controlling pain.  4.  Constipation: - She will take stool softener and lactulose as needed.  Total time spent is 25 minutes with more than 50% of the time spent face-to-face discussing treatment plan, counseling and coordination of care.  Orders placed this encounter:  No orders of the defined types were placed in this encounter.     Derek Jack, MD [place] [phone]

## 2019-05-16 NOTE — Progress Notes (Signed)
Patient identified during recent ED visit 8/2 for possible palliative care referral due to metastatic pancreatic cancer, frequent ED utilization, weight loss and cancer related pain. She has no documented goals of care or advance care planning information available in her chart or accessible through the storyboard ACP platform. Recommend community based palliative care referral for anticipatory care planning, serious illness support and symptom management. If appropriate and patient agreeable for this additional support please place ambulatory palliative care referral into Epic.  Lane Hacker, DO Palliative Medicine

## 2019-05-16 NOTE — Patient Instructions (Signed)
[place] at [facility] Discharge Instructions  Chemo tx held today. Received IV hydration only. Follow-up as scheduled. Call clinic for any questions or concerns   Thank you for choosing [place] at [facility] to provide your oncology and hematology care.  To afford each patient quality time with our provider, please arrive at least 15 minutes before your scheduled appointment time.   If you have a lab appointment with the [facility] please come in thru the  Main Entrance and check in at the main information desk  You need to re-schedule your appointment should you arrive 10 or more minutes late.  We strive to give you quality time with our providers, and arriving late affects you and other patients whose appointments are after yours.  Also, if you no show three or more times for appointments you may be dismissed from the clinic at the providers discretion.     Again, thank you for choosing [facility].  Our hope is that these requests will decrease the amount of time that you wait before being seen by our physicians.       _____________________________________________________________  Should you have questions after your visit to [facility], please contact our office at [phone] between the hours of 8:00 a.m. and 4:30 p.m.  Voicemails left after 4:00 p.m. will not be returned until the following business day.  For prescription refill requests, have your pharmacy contact our office and allow 72 hours.    Cancer Center Support Programs:   > Cancer Support Group  2nd Tuesday of the month 1pm-2pm, Journey Room

## 2019-05-17 MED ORDER — OCTREOTIDE ACETATE 30 MG IM KIT
PACK | INTRAMUSCULAR | Status: AC
  Filled 2019-05-17: qty 1

## 2019-05-18 ENCOUNTER — Encounter (HOSPITAL_COMMUNITY): Payer: PPO

## 2019-05-20 ENCOUNTER — Ambulatory Visit (HOSPITAL_COMMUNITY): Payer: PPO

## 2019-05-20 ENCOUNTER — Encounter (HOSPITAL_COMMUNITY): Payer: Self-pay | Admitting: Lab

## 2019-05-20 NOTE — Progress Notes (Signed)
Nutrition Assessment:  Referral for elevated blood glucose and weight loss, poor intake  73 year old female with metastatic pancreatic cancer to liver and lungs.  Past medical history of DM, HTN, COPD.  Noted hospitalization 7/30-05/14/2019 with elevated blood glucose.  Noted patient had been instructed to not take toujeo was only taking amaryl periodically.  Patient chemotherapy has been on hold.  Spoke with patient and son via phone.  Patient reports that nausea keeps her from eating more and no appetite.  Reports that she is taking remeron and marinol.  Reports that fruit and soups (tomato and chicken noodle, egg drop) are working for her but not much else.  Son reports that "her body is rejecting protein."  Has been drinking ensure plus but was told to stop after elevated blood glucose.  Has tried glucerna and does not like it.    Reports issues mostly with constipation but some diarrhea at times.    Son reports that patient is trying to eat but just can't.  Medications: remeron, marinol, compazine, lactulose  Labs: reviewed from 8/3  Anthropometrics:   Height: 61 inches Weight: 102 lb 6.4 oz on 8/3 Noted weight on 3/17/20202 125 lb BMI: 19  18% weight loss in the last 4 months, significant   Estimated Energy Needs  Kcals: 1380-1600 Protein: 69-80 g/d Fluid: 1.6 L  NUTRITION DIAGNOSIS: Inadequate oral intake related to cancer related treatment side effects as evidenced by nausea, 18% weight loss    INTERVENTION: Provided names of shakes with lower amounts of carbohdyrate for patient to try (ie premier protein, boost glucose control, ensure max protein, boost max).   Discussed calorie difference in ensure plus and lower carb shakes and that patient would need to increase calories in solid foods.  Discussed foods that have higher calorie amounts but that would not contribute to elevated blood glucose (protein foods, condiments to add to foods, etc).   Encouraged patient to  discuss additional antiemetic regimen with Dr. Raliegh Ip for better symptom control.  Offered follow-up appointment but son declined at this time.  Contact information provided     MONITORING, EVALUATION, GOAL: Patient will consume adequate calories and protein to maintain weight   NEXT VISIT: declined follow-up appt, patient will call if has additional questions  Chasidy Janak B. Zenia Resides, [place], [place] Registered Dietitian [phone] (pager)

## 2019-05-20 NOTE — Progress Notes (Unsigned)
Referral sent to [facility].  Records faxed on 8/7

## 2019-05-23 ENCOUNTER — Telehealth: Payer: Self-pay | Admitting: Internal Medicine

## 2019-05-23 NOTE — Telephone Encounter (Signed)
Called patient to schedule Palliative Consult, no answer and I was unable to leave a voicemail because mailbox was full.  I will follow-up.

## 2019-05-24 ENCOUNTER — Other Ambulatory Visit: Payer: Self-pay

## 2019-05-24 ENCOUNTER — Inpatient Hospital Stay (HOSPITAL_COMMUNITY): Payer: PPO

## 2019-05-24 ENCOUNTER — Encounter (HOSPITAL_COMMUNITY): Payer: Self-pay | Admitting: Hematology

## 2019-05-24 ENCOUNTER — Ambulatory Visit (HOSPITAL_COMMUNITY): Payer: PPO

## 2019-05-24 ENCOUNTER — Other Ambulatory Visit (HOSPITAL_COMMUNITY): Payer: PPO

## 2019-05-24 ENCOUNTER — Inpatient Hospital Stay (HOSPITAL_BASED_OUTPATIENT_CLINIC_OR_DEPARTMENT_OTHER): Payer: PPO | Admitting: Hematology

## 2019-05-24 ENCOUNTER — Ambulatory Visit (HOSPITAL_COMMUNITY): Payer: PPO | Admitting: Hematology

## 2019-05-24 DIAGNOSIS — C251 Malignant neoplasm of body of pancreas: Secondary | ICD-10-CM

## 2019-05-24 LAB — COMPREHENSIVE METABOLIC PANEL
ALT: 18 U/L (ref 0–44)
AST: 20 U/L (ref 15–41)
Albumin: 2.6 g/dL — ABNORMAL LOW (ref 3.5–5.0)
Alkaline Phosphatase: 207 U/L — ABNORMAL HIGH (ref 38–126)
Anion gap: 10 (ref 5–15)
BUN: 18 mg/dL (ref 8–23)
CO2: 22 mmol/L (ref 22–32)
Calcium: 8.7 mg/dL — ABNORMAL LOW (ref 8.9–10.3)
Chloride: 99 mmol/L (ref 98–111)
Creatinine, Ser: 0.64 mg/dL (ref 0.44–1.00)
GFR calc Af Amer: >60 mL/min (ref >60)
GFR calc non Af Amer: >60 mL/min (ref >60)
Glucose, Bld: 157 mg/dL — ABNORMAL HIGH (ref 70–99)
Potassium: 4.1 mmol/L (ref 3.5–5.1)
Sodium: 131 mmol/L — ABNORMAL LOW (ref 135–145)
Total Bilirubin: 0.7 mg/dL (ref 0.3–1.2)
Total Protein: 5.6 g/dL — ABNORMAL LOW (ref 6.5–8.1)

## 2019-05-24 LAB — CBC WITH DIFFERENTIAL/PLATELET
Abs Immature Granulocytes: 0.04 10*3/uL (ref 0.00–0.07)
Basophils Absolute: 0 10*3/uL (ref 0.0–0.1)
Basophils Relative: 1 %
Eosinophils Absolute: 0.5 10*3/uL (ref 0.0–0.5)
Eosinophils Relative: 6 %
HCT: 33.6 % — ABNORMAL LOW (ref 36.0–46.0)
Hemoglobin: 10.9 g/dL — ABNORMAL LOW (ref 12.0–15.0)
Immature Granulocytes: 1 %
Lymphocytes Relative: 20 %
Lymphs Abs: 1.6 10*3/uL (ref 0.7–4.0)
MCH: 30.7 pg (ref 26.0–34.0)
MCHC: 32.4 g/dL (ref 30.0–36.0)
MCV: 94.6 fL (ref 80.0–100.0)
Monocytes Absolute: 0.6 10*3/uL (ref 0.1–1.0)
Monocytes Relative: 8 %
Neutro Abs: 5.4 10*3/uL (ref 1.7–7.7)
Neutrophils Relative %: 64 %
Platelets: 233 10*3/uL (ref 150–400)
RBC: 3.55 MIL/uL — ABNORMAL LOW (ref 3.87–5.11)
RDW: 17.8 % — ABNORMAL HIGH (ref 11.5–15.5)
WBC: 8.2 10*3/uL (ref 4.0–10.5)
nRBC: 0 % (ref 0.0–0.2)

## 2019-05-24 MED ORDER — SODIUM CHLORIDE 0.9 % IV SOLN
INTRAVENOUS | Status: DC
  Administered 2019-05-24: 10:00:00 via INTRAVENOUS

## 2019-05-24 MED ORDER — SCOPOLAMINE 1 MG/3DAYS TD PT72
1.0000 | MEDICATED_PATCH | Freq: Once | TRANSDERMAL | Status: DC
  Administered 2019-05-24: 1.5 mg via TRANSDERMAL
  Filled 2019-05-24: qty 1

## 2019-05-24 MED ORDER — SODIUM CHLORIDE 0.9% FLUSH
10.0000 mL | Freq: Once | INTRAVENOUS | Status: AC
  Administered 2019-05-24: 10 mL via INTRAVENOUS

## 2019-05-24 MED ORDER — HEPARIN SOD (PORK) LOCK FLUSH 100 UNIT/ML IV SOLN
500.0000 [IU] | Freq: Once | INTRAVENOUS | Status: AC
  Administered 2019-05-24: 500 [IU] via INTRAVENOUS

## 2019-05-24 MED ORDER — SODIUM CHLORIDE 0.9 % IV SOLN
Freq: Once | INTRAVENOUS | Status: AC
  Administered 2019-05-24: 10:00:00 via INTRAVENOUS
  Filled 2019-05-24: qty 250

## 2019-05-24 NOTE — Progress Notes (Signed)
[place], [place] [address]   CLINIC:  Medical Oncology/Hematology  PCP:  Sharilyn Sites, [address] [place] [address] [phone]   REASON FOR VISIT: Follow-up for metastatic pancreatic cancer   BRIEF ONCOLOGIC HISTORY:  Oncology History  Cancer of pancreas, body ([place])  02/09/2019 Initial Diagnosis   Cancer of pancreas, body ([place])   02/21/2019 -  Chemotherapy   The patient had palonosetron (ALOXI) injection 0.25 mg, 0.25 mg, Intravenous,  Once, 3 of 4 cycles Administration: 0.25 mg (02/21/2019), 0.25 mg (03/09/2019), 0.25 mg (04/04/2019) pegfilgrastim (NEULASTA) injection 6 mg, 6 mg, Subcutaneous, Once, 1 of 1 cycle Administration: 6 mg (04/06/2019) pegfilgrastim-jmdb (FULPHILA) injection 6 mg, 6 mg, Subcutaneous,  Once, 0 of 1 cycle irinotecan (CAMPTOSAR) 240 mg in sodium chloride 0.9 % 500 mL chemo infusion, 150 mg/m2 = 240 mg (100 % of original dose 150 mg/m2), Intravenous,  Once, 2 of 3 cycles Dose modification: 150 mg/m2 (original dose 150 mg/m2, Cycle 2, Reason: Provider Judgment), 105 mg/m2 (70 % of original dose 150 mg/m2, Cycle 3, Reason: Other (see comments), Comment: weight loss) Administration: 240 mg (03/09/2019), 160 mg (04/04/2019) leucovorin 620 mg in dextrose 5 % 250 mL infusion, 400 mg/m2 = 620 mg, Intravenous,  Once, 3 of 4 cycles Dose modification: 280 mg/m2 (70 % of original dose 400 mg/m2, Cycle 3, Reason: Other (see comments), Comment: weight loss) Administration: 620 mg (02/21/2019), 620 mg (03/09/2019), 434 mg (04/04/2019) oxaliplatin (ELOXATIN) 130 mg in dextrose 5 % 500 mL chemo infusion, 85 mg/m2 = 130 mg, Intravenous,  Once, 3 of 4 cycles Dose modification: 59.5 mg/m2 (70 % of original dose 85 mg/m2, Cycle 3, Reason: Other (see comments), Comment: weight loss) Administration: 130 mg (02/21/2019), 130 mg (03/09/2019), 90 mg (04/04/2019) fluorouracil (ADRUCIL) 3,700 mg in sodium chloride 0.9 % 76 mL chemo  infusion, 2,400 mg/m2 = 3,700 mg, Intravenous, 1 Day/Dose, 3 of 4 cycles Dose modification: 1,680 mg/m2 (70 % of original dose 2,400 mg/m2, Cycle 3, Reason: Other (see comments), Comment: weight loss) Administration: 3,700 mg (02/21/2019), 3,700 mg (03/09/2019), 2,600 mg (04/04/2019)  for chemotherapy treatment.    03/02/2019 Genetic Testing   Negative genetic testing on the common hereditary cancer panel+pancreatic cancer panel+preliminary evidence genes for pancreatic cancer+chronic pancratitis genes.  This Gene Panel offered by [facility] includes sequencing and/or deletion duplication testing of the following 55 genes: APC, ATM, AXIN2, BARD1, BMPR1A, BRCA1, BRCA2, BRIP1, CASR, CDH1, CDK4, CDKN2A (p14ARF), CDKN2A (p16INK4a), CFTR, CHEK2, CPA1, CTNNA1, CTRC, DICER1, EPCAM (Deletion/duplication testing only), FANCC, GREM1 (promoter region deletion/duplication testing only), KIT, MEN1, MLH1, MSH2, MSH3, MSH6, MUTYH, NBN, NF1, NHTL1, PALB2, PALLD, PDGFRA, PMS2, POLD1, POLE,PRSS1, PTEN, RAD50, RAD51C, RAD51D, SDHB, SDHC, SDHD, SMAD4, SMARCA4. SPINK1, STK11, TP53, TSC1, TSC2, and VHL.  The following genes were evaluated for sequence changes only: SDHA and HOXB13 c.251G>A variant only. The report date is Mar 02, 2019.     INTERVAL HISTORY:  Ms. Christiana 73 y.o. female seen for metastatic pancreatic cancer.  Last chemotherapy was on 04/05/2019.  She is drinking about 2 cans of Ensure per day.  Not eating any solid foods.  Energy levels are severely low.  Appetite is also very low.  Pain in the back and right side is very well controlled with the current regimen of oxycodone.  She had developed vomiting for the last 1 week.  She is vomiting twice daily.  She is taking Compazine twice daily which is not completely controlling it.  Denies  any fevers or chills.  REVIEW OF SYSTEMS:  Review of Systems  Constitutional: Positive for fatigue.  Cardiovascular: Positive for leg swelling.  Gastrointestinal: Positive for  vomiting.  All other systems reviewed and are negative.    PAST MEDICAL/SURGICAL HISTORY:  Past Medical History:  Diagnosis Date  . Cancer ([place])    hx of breast cancer left  . Diabetes mellitus   . Family history of breast cancer   . Family history of pancreatic cancer   . Hypertension   . Tobacco use 02/04/2019   Past Surgical History:  Procedure Laterality Date  . bladder tack    . COLONOSCOPY  06/04/2012   Procedure: COLONOSCOPY;  Surgeon: Jamesetta So, MD;  [facility];  Service: Gastroenterology;  Laterality: N/A;  . ESOPHAGOGASTRODUODENOSCOPY  06/04/2012   Procedure: ESOPHAGOGASTRODUODENOSCOPY (EGD);  Surgeon: Jamesetta So, MD;  Location: [facility];  Service: Gastroenterology;  Laterality: N/A;  . HERNIA REPAIR    . IR IMAGING GUIDED PORT INSERTION  02/18/2019  . IR US GUIDE BX ASP/DRAIN  02/07/2019  . left mastectomy    . TONSILLECTOMY    . VESICO-VAGINAL FISTULA REPAIR       SOCIAL HISTORY:  Social History   Socioeconomic History  . Marital status: Divorced    Spouse name: Not on file  . Number of children: 3  . Years of education: Not on file  . Highest education level: Not on file  Occupational History  . Occupation: retired  Scientific laboratory technician  . Financial resource strain: Not hard at all  . Food insecurity    Worry: Never true    Inability: Never true  . Transportation needs    Medical: No    Non-medical: No  Tobacco Use  . Smoking status: Current Every Day Smoker    Packs/day: 0.50    Types: Cigarettes  . Smokeless tobacco: Never Used  Substance and Sexual Activity  . Alcohol use: Yes    Comment: socially drink  . Drug use: No  . Sexual activity: Yes  Lifestyle  . Physical activity    Days per week: 0 days    Minutes per session: 0 min  . Stress: Only a little  Relationships  . Social Herbalist on phone: Twice a week    Gets together: Twice a week    Attends religious service: Never    Active member of club or  organization: No    Attends meetings of clubs or organizations: Never    Relationship status: Not on file  . Intimate partner violence    Fear of current or ex partner: No    Emotionally abused: No    Physically abused: No    Forced sexual activity: No  Other Topics Concern  . Not on file  Social History Narrative  . Not on file    FAMILY HISTORY:  Family History  Problem Relation Age of Onset  . Hypertension Mother   . Pancreatic cancer Mother 33  . Diabetes Father   . Hypertension Father   . Liver cancer Father   . Pancreatitis Sister   . Breast cancer Sister 50  . Aortic dissection Sister   . Aortic dissection Brother   . Liver cancer Paternal Grandfather     CURRENT MEDICATIONS:  Outpatient Encounter Medications as of 05/24/2019  Medication Sig Note  . albuterol (VENTOLIN HFA) 108 (90 Base) MCG/ACT inhaler Inhale 2 puffs into the lungs every 4 (four) hours as needed  for wheezing or shortness of breath.   . B-D ULTRAFINE III SHORT PEN 31G X 8 MM MISC USE ONCE AT BEDTIME   . dronabinol (MARINOL) 5 MG capsule Take 1 capsule (5 mg total) by mouth 2 (two) times daily before a meal.   . Insulin Glargine, 1 Unit Dial, (TOUJEO SOLOSTAR) 300 UNIT/ML SOPN Inject 10 Units into the skin daily. 05/15/2019: Patient took 10 units at 0700 on 05/15/19  . lactulose, encephalopathy, (CHRONULAC) 10 GM/15ML SOLN Take 30 mLs (20 g total) by mouth at bedtime.   . lidocaine-prilocaine (EMLA) cream Apply 1 application topically as needed. 1 hour prior to stick and cover with plastic wrap   . mirtazapine (REMERON) 30 MG tablet Take 0.5 tablets (15 mg total) by mouth at bedtime. (Patient taking differently: Take 30 mg by mouth at bedtime. )   . ONETOUCH ULTRA test strip TEST TWICE DAILY   . oxyCODONE-acetaminophen (PERCOCET/ROXICET) 5-325 MG tablet Take 1-2 tablets by mouth every 6 (six) hours as needed for moderate pain or severe pain.   Marland Kitchen prochlorperazine (COMPAZINE) 10 MG tablet TAKE 1 TABLET BY  MOUTH EVERY 6 HOURS AS NEEDED FOR NAUSEA AND VOMITING (Patient taking differently: Take 10 mg by mouth every 6 (six) hours as needed for nausea or vomiting. )   . lidocaine (LIDODERM) 5 % Place 1 patch onto the skin daily. Remove & Discard patch within 12 hours or as directed by MD (Patient not taking: Reported on 05/15/2019) 05/15/2019: Patient's current prescription has run out and no longer has any patches at home   No facility-administered encounter medications on file as of 05/24/2019.     ALLERGIES:  Allergies  Allergen Reactions  . Duricef [Cefadroxil] Shortness Of Breath and Swelling  . Tape Rash     PHYSICAL EXAM:  ECOG Performance status: 1  Vitals:   05/24/19 0832  BP: 101/63  Pulse: (!) 108  Resp: 16  Temp: (!) 97.5 F (36.4 C)  SpO2: 100%   Filed Weights   05/24/19 0832  Weight: 105 lb (47.6 kg)    Physical Exam Constitutional:      Appearance: Normal appearance. She is normal weight.  Cardiovascular:     Rate and Rhythm: Normal rate and regular rhythm.     Heart sounds: Normal heart sounds.  Pulmonary:     Effort: Pulmonary effort is normal.     Breath sounds: Normal breath sounds.  Abdominal:     General: Bowel sounds are normal.     Palpations: Abdomen is soft.  Musculoskeletal: Normal range of motion.     Right lower leg: Edema present.     Left lower leg: Edema present.  Skin:    General: Skin is warm and dry.  Neurological:     Mental Status: She is alert and oriented to person, place, and time. Mental status is at baseline.  Psychiatric:        Mood and Affect: Mood normal.        Behavior: Behavior normal.        Thought Content: Thought content normal.        Judgment: Judgment normal.      LABORATORY DATA:  I have reviewed the labs as listed.  CBC    Component Value Date/Time   WBC 8.2 05/24/2019 0828   RBC 3.55 (L) 05/24/2019 0828   HGB 10.9 (L) 05/24/2019 0828   HGB 10.5 (L) 02/21/2019 1153   HCT 33.6 (L) 05/24/2019 0828   PLT  233 05/24/2019  0828   PLT 345 02/21/2019 1153   MCV 94.6 05/24/2019 0828   MCH 30.7 05/24/2019 0828   MCHC 32.4 05/24/2019 0828   RDW 17.8 (H) 05/24/2019 0828   LYMPHSABS 1.6 05/24/2019 0828   MONOABS 0.6 05/24/2019 0828   EOSABS 0.5 05/24/2019 0828   BASOSABS 0.0 05/24/2019 0828   CMP Latest Ref Rng & Units 05/24/2019 05/16/2019 05/15/2019  Glucose 70 - 99 mg/dL 157(H) 168(H) 231(H)  BUN 8 - 23 mg/dL 18 15 17   Creatinine 0.44 - 1.00 mg/dL 0.64 0.58 0.63  Sodium 135 - 145 mmol/L 131(L) 130(L) 130(L)  Potassium 3.5 - 5.1 mmol/L 4.1 4.2 4.0  Chloride 98 - 111 mmol/L 99 100 101  CO2 22 - 32 mmol/L 22 22 22   Calcium 8.9 - 10.3 mg/dL 8.7(L) 8.5(L) 8.0(L)  Total Protein 6.5 - 8.1 g/dL 5.6(L) 5.8(L) -  Total Bilirubin 0.3 - 1.2 mg/dL 0.7 0.4 -  Alkaline Phos 38 - 126 U/L 207(H) 170(H) -  AST 15 - 41 U/L 20 21 -  ALT 0 - 44 U/L 18 21 -   I have independently reviewed scans and discussed with her.  ASSESSMENT & PLAN:   Cancer of pancreas, body ([place]) 1.  Metastatic pancreatic cancer to the liver and lungs: - 3 cycles of FOLFOX based chemotherapy from 02/21/2019 through 04/05/2019 with poor tolerance. - CT CAP on 04/22/2019 shows improvement in size of the pancreatic mass, bilateral pulmonary nodules and liver metastasis.  Tumor marker also improved to 18,000 from 32,000. -Germline mutation testing was negative. - Recent admission to the hospital in the last week of July for hyperosmolar nonketotic state. - She is continuing to do poorly in terms of appetite and eating.  No energy improvement. -We had a prolonged discussion on hospice/palliative care.  She is agreeable for referral.  I will let her talk to them.  She will come back in 2 weeks for follow-up.  2.  Weight loss: - She is not able to eat much of solid foods.  She is drinking Ensure 2 cans/day. -However she gained 3 pounds from last visit.  I think this is mostly fluid gain as she had some edema in the extremities.  I was reluctant  to consider any diuretic as her blood pressure is borderline.  3.  Abdominal and back pain: -This is well controlled with Percocet 1 to 2 tablets every 6 hours.  4.  Hypomagnesemia: -She received 2 g of magnesium.  5.  Nausea/vomiting: -She has a vomited for 1 week, twice daily.  She is taking Compazine twice daily. -We will give a trial of scopolamine patch today.  If it works, she will call us for prescription.  Total time spent is 40 minutes with more than 50% of the time spent face-to-face discussing palliative care options, counseling and coordination of care.  Orders placed this encounter:  No orders of the defined types were placed in this encounter.     Derek Jack, MD [place] [phone]

## 2019-05-24 NOTE — Patient Instructions (Signed)
[place] at [facility] Discharge Instructions  You were seen today by Dr. Delton Coombes. He went over your recent lab results. He will see you back in 2 weeks for labs and follow up. We will send a referral to palliative care.    Thank you for choosing [place] at [facility] to provide your oncology and hematology care.  To afford each patient quality time with our provider, please arrive at least 15 minutes before your scheduled appointment time.   If you have a lab appointment with the [facility] please come in thru the  Main Entrance and check in at the main information desk  You need to re-schedule your appointment should you arrive 10 or more minutes late.  We strive to give you quality time with our providers, and arriving late affects you and other patients whose appointments are after yours.  Also, if you no show three or more times for appointments you may be dismissed from the clinic at the providers discretion.     Again, thank you for choosing [facility].  Our hope is that these requests will decrease the amount of time that you wait before being seen by our physicians.       _____________________________________________________________  Should you have questions after your visit to [facility], please contact our office at [phone] between the hours of 8:00 a.m. and 4:30 p.m.  Voicemails left after 4:00 p.m. will not be returned until the following business day.  For prescription refill requests, have your pharmacy contact our office and allow 72 hours.    Cancer Center Support Programs:   > Cancer Support Group  2nd Tuesday of the month 1pm-2pm, Journey Room

## 2019-05-24 NOTE — Patient Instructions (Signed)
[facility] at [facility]  Discharge Instructions:   _______________________________________________________________  Thank you for choosing [facility] at [facility] to provide your oncology and hematology care.  To afford each patient quality time with our providers, please arrive at least 15 minutes before your scheduled appointment.  You need to re-schedule your appointment if you arrive 10 or more minutes late.  We strive to give you quality time with our providers, and arriving late affects you and other patients whose appointments are after yours.  Also, if you no show three or more times for appointments you may be dismissed from the clinic.  Again, thank you for choosing  [facility] at [facility]. Our hope is that these requests will allow you access to exceptional care and in a timely manner. _______________________________________________________________  If you have questions after your visit, please contact our office at [phone] between the hours of 8:30 a.m. and 5:00 p.m. Voicemails left after 4:30 p.m. will not be returned until the following business day. _______________________________________________________________  For prescription refill requests, have your pharmacy contact our office. _______________________________________________________________  Recommendations made by the consultant and any test results will be sent to your referring physician. _______________________________________________________________ 

## 2019-05-24 NOTE — Progress Notes (Signed)
No treatment today per MD. Will give Magnesium as ordered.

## 2019-05-24 NOTE — Assessment & Plan Note (Signed)
1.  Metastatic pancreatic cancer to the liver and lungs: - 3 cycles of FOLFOX based chemotherapy from 02/21/2019 through 04/05/2019 with poor tolerance. - CT CAP on 04/22/2019 shows improvement in size of the pancreatic mass, bilateral pulmonary nodules and liver metastasis.  Tumor marker also improved to 18,000 from 32,000. -Germline mutation testing was negative. - Recent admission to the hospital in the last week of July for hyperosmolar nonketotic state. - She is continuing to do poorly in terms of appetite and eating.  No energy improvement. -We had a prolonged discussion on hospice/palliative care.  She is agreeable for referral.  I will let her talk to them.  She will come back in 2 weeks for follow-up.  2.  Weight loss: - She is not able to eat much of solid foods.  She is drinking Ensure 2 cans/day. -However she gained 3 pounds from last visit.  I think this is mostly fluid gain as she had some edema in the extremities.  I was reluctant to consider any diuretic as her blood pressure is borderline.  3.  Abdominal and back pain: -This is well controlled with Percocet 1 to 2 tablets every 6 hours.  4.  Hypomagnesemia: -She received 2 g of magnesium.  5.  Nausea/vomiting: -She has a vomited for 1 week, twice daily.  She is taking Compazine twice daily. -We will give a trial of scopolamine patch today.  If it works, she will call us for prescription.

## 2019-05-26 ENCOUNTER — Encounter (HOSPITAL_COMMUNITY): Payer: PPO

## 2019-05-26 ENCOUNTER — Telehealth: Payer: Self-pay | Admitting: Internal Medicine

## 2019-05-26 NOTE — Telephone Encounter (Signed)
Called patient's daughter Thayer Headings to schedule Palliative Consult, no answer.  Left message with reason for call along with my contact information

## 2019-05-29 ENCOUNTER — Other Ambulatory Visit: Payer: Self-pay | Admitting: Hematology

## 2019-05-30 ENCOUNTER — Telehealth: Payer: Self-pay | Admitting: Internal Medicine

## 2019-05-30 ENCOUNTER — Other Ambulatory Visit (HOSPITAL_COMMUNITY): Payer: Self-pay | Admitting: *Deleted

## 2019-05-30 DIAGNOSIS — C251 Malignant neoplasm of body of pancreas: Secondary | ICD-10-CM

## 2019-05-30 MED ORDER — OXYCODONE-ACETAMINOPHEN 5-325 MG PO TABS: 1.0000 | ORAL_TABLET | Freq: Four times a day (QID) | ORAL | 0 refills | Status: AC | PRN

## 2019-05-30 NOTE — Telephone Encounter (Signed)
Rec'd call back from daughter Thayer Headings and we have scheduled a Telephone Palliative Consult for 05/31/19 @ 2:30 PM.

## 2019-05-31 ENCOUNTER — Encounter: Payer: Self-pay | Admitting: Internal Medicine

## 2019-05-31 ENCOUNTER — Other Ambulatory Visit: Payer: PPO | Admitting: Internal Medicine

## 2019-05-31 ENCOUNTER — Other Ambulatory Visit: Payer: Self-pay

## 2019-05-31 DIAGNOSIS — Z515 Encounter for palliative care: Secondary | ICD-10-CM | POA: Diagnosis not present

## 2019-05-31 NOTE — Progress Notes (Signed)
Aug 18th, 2020 [facility] Palliative Care Consult Note Telephone: [phone]  Fax: [phone]  Due to the current COVID-19 infection/crises, the family prefer, and have given their verbal consent for, a provider visit via telemedicine. HIPPA policies of confidentially were discussed. Video-audio (telehealth) contact was unable to be done due technical barriers from the patients side.  PATIENT NAME: Kristine Rodriguez DOB: 1946/03/20 MRN: [id number] [address] [place] [address] Pt #:  [id number] PRIMARY CARE PROVIDER:   Sharilyn Rodriguez, [address] [place],  [place] [address] [phone]  REFERRING PROVIDER:  Dr. Delton Coombes ([place] [phone]) Referral 05/19/2019)  RESPONSIBLE PARTY:  Kristine Rodriguez ([place]) [phone] - CALL THIS [place]. (son) Kristine Rodriguez. ASSESSMENT / RECOMMENDATIONS:  1. Advance Care Planning: a. Directives: DNR completed; copy uploaded into [facility] EMR, and mailed to patients home. b. Goals of Care: No further plans for aggressive cancer treatment. Request by patient and daughter for hospice referral for end of life care. 2. Cognitive / Functional status: PCG daughter Kristine Rodriguez reports patient with increased confusion; notes slow processing of information. Speech slower and mumbled. Oriented to place and person; recognizes family members. Increased weakness with extreme fatigue after ambulating 20 feet. Now needing assist to transfer, ambulate. Dependent for dressing and hygiene. Weight Aug 3rd was 102 lbs, reflecting loss of 20 lbs over previous 3 months. Spontaneous emesis without nausea after eating or drinking small amounts foods/fluids, or after exertion. Taking Compazine q 6 hr. No appetite. Consumes only small amounts broth only. Constipation currently well managed with evening dose Lactulose. Back pain radiating to bilateral sides managed with 2 tabs Percocet 5-325mg  q 6 hr but  daughter mentions now less effective. Kristine Rodriguez reports tiny facial blood blister above R cheek, and water blister between nose/eye. Reports LE swelling L>R noted about 1 week ago and progressing. We discussed possible DVT setting of hypercoagulable state of cancer. She continues to smoke small amounts. 3. Stress setting of life-threatening illness / Family Supports: Divorced. 3 children.  Patient living in her own home with 24/7 coverage by very supportive daughter and son. 4. Follow up: Hospice Referral I spent 60 minutes providing this consultation from 2:30pm to 3:30pm. More than 50% of the time in this consultation was spent coordinating communication.   HISTORY OF PRESENT ILLNESS:  Kristine Rodriguez is a 73 y.o. female with Metastic (liver/lungs) pancreatic cancer (dx April 2020. Chemo (port-a-cath) with improvement size pancreatic mass/bilat pulm nodules and liver metastasis. Improvement tumor markers. Chemo held d/t weight loss). Hospitalized 7/30-8/1 with hyperosmolar nonketotic state. Toujeo restarted. f/I 8/7 weakness. H/O L breast cancer, DM, and HTN. Palliative Care was asked to help address goals of care.   CODE STATUS: DNR  PPS: 20-30%  HOSPICE ELIGIBILITY/DIAGNOSIS: TBD  PAST MEDICAL HISTORY:  Past Medical History:  Diagnosis Date   Cancer ([place])    hx of breast cancer left   Diabetes mellitus    Family history of breast cancer    Family history of pancreatic cancer    Hypertension    Tobacco use 02/04/2019    SOCIAL HX:  Social History   Tobacco Use   Smoking status: Current Every Day Smoker    Packs/day: 0.50    Types: Cigarettes   Smokeless tobacco: Never Used  Substance Use Topics   Alcohol use: Yes    Comment: socially drink    ALLERGIES:  Allergies  Allergen Reactions   Duricef [Cefadroxil] Shortness Of  Breath and Swelling   Tape Rash     PERTINENT MEDICATIONS:  Outpatient Encounter Medications as of 05/31/2019  Medication Sig   albuterol  (VENTOLIN HFA) 108 (90 Base) MCG/ACT inhaler Inhale 2 puffs into the lungs every 4 (four) hours as needed for wheezing or shortness of breath.   dronabinol (MARINOL) 5 MG capsule Take 1 capsule (5 mg total) by mouth 2 (two) times daily before a meal.   lactulose, encephalopathy, (CHRONULAC) 10 GM/15ML SOLN Take 30 mLs (20 g total) by mouth at bedtime.   lidocaine-prilocaine (EMLA) cream Apply 1 application topically as needed. 1 hour prior to stick and cover with plastic wrap   mirtazapine (REMERON) 30 MG tablet Take 0.5 tablets (15 mg total) by mouth at bedtime. (Patient taking differently: Take 30 mg by mouth at bedtime. )   oxyCODONE-acetaminophen (PERCOCET/ROXICET) 5-325 MG tablet Take 1-2 tablets by mouth every 6 (six) hours as needed for moderate pain or severe pain.   prochlorperazine (COMPAZINE) 10 MG tablet TAKE 1 TABLET BY MOUTH EVERY 6 HOURS AS NEEDED FOR NAUSEA AND VOMITING   ONETOUCH ULTRA test strip TEST TWICE DAILY (Patient not taking: Reported on 05/31/2019)   [DISCONTINUED] B-D ULTRAFINE III SHORT PEN 31G X 8 MM MISC USE ONCE AT BEDTIME   [DISCONTINUED] Insulin Glargine, 1 Unit Dial, (TOUJEO SOLOSTAR) 300 UNIT/ML SOPN Inject 10 Units into the skin daily.   [DISCONTINUED] lidocaine (LIDODERM) 5 % Place 1 patch onto the skin daily. Remove & Discard patch within 12 hours or as directed by MD (Patient not taking: Reported on 05/15/2019)   No facility-administered encounter medications on file as of 05/31/2019.     PHYSICAL EXAM:   PE deferred d/t telehealth/telephonic nature of visit.  Kristine Handler, NP

## 2019-06-07 ENCOUNTER — Inpatient Hospital Stay (HOSPITAL_COMMUNITY): Payer: PPO | Admitting: Hematology

## 2019-06-07 ENCOUNTER — Inpatient Hospital Stay (HOSPITAL_COMMUNITY): Payer: PPO

## 2019-06-09 ENCOUNTER — Other Ambulatory Visit (HOSPITAL_COMMUNITY): Payer: Self-pay | Admitting: Nurse Practitioner

## 2019-06-09 ENCOUNTER — Telehealth (HOSPITAL_COMMUNITY): Payer: Self-pay | Admitting: Emergency Medicine

## 2019-06-09 NOTE — Telephone Encounter (Signed)
Per Lala Lund, NP patient can stop taking Marinol and Remeron if they wish to do so. Kristin with Hospice care called asking for medications that could be discontinued for the patient. This nurse left a VM with kristin at hospice for her to call back so this could be communicated.

## 2019-07-14 DEATH — deceased

## 2020-09-20 IMAGING — MR MRI THORACIC SPINE WITHOUT CONTRAST
4 of 6 series · 10 of 48 positions shown · non-contrast
Comparison: Thoracic and lumbar spine x-rays dated December 28, 2018.

CLINICAL DATA: Acute midline thoracic back pain. Chronic bilateral
lower back pain without radiculopathy.

EXAM:
MRI THORACIC AND LUMBAR SPINE WITHOUT CONTRAST
TECHNIQUE: Multiplanar and multiecho pulse sequences of the thoracic and lumbar
spine were obtained without intravenous contrast.

[Series 5: T1 · sagittal · 4.0mm · 0.52mm/px · 3 of 13 slices shown (1 of 2)]
[im 1/13]
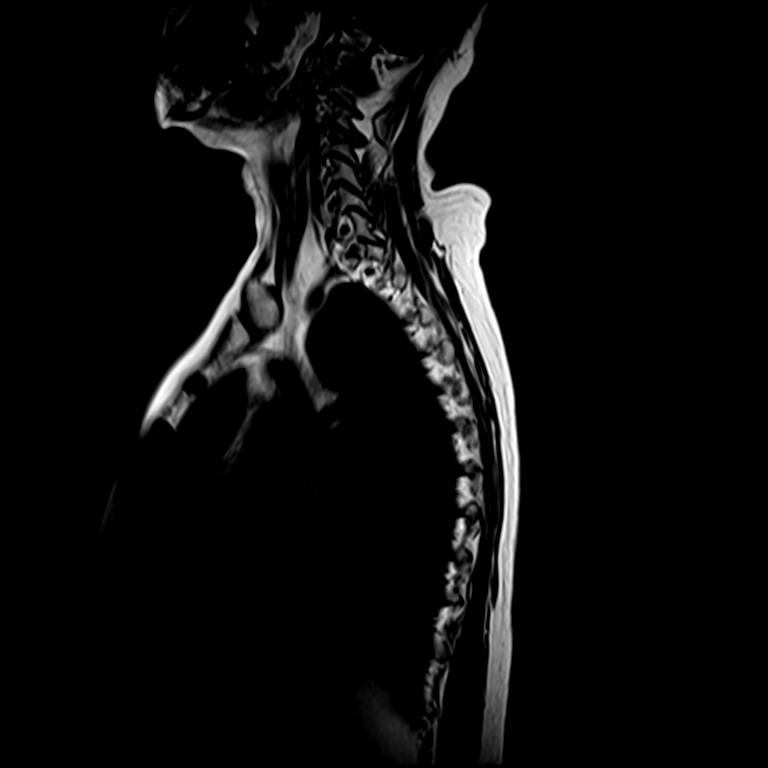
[im 9/13]
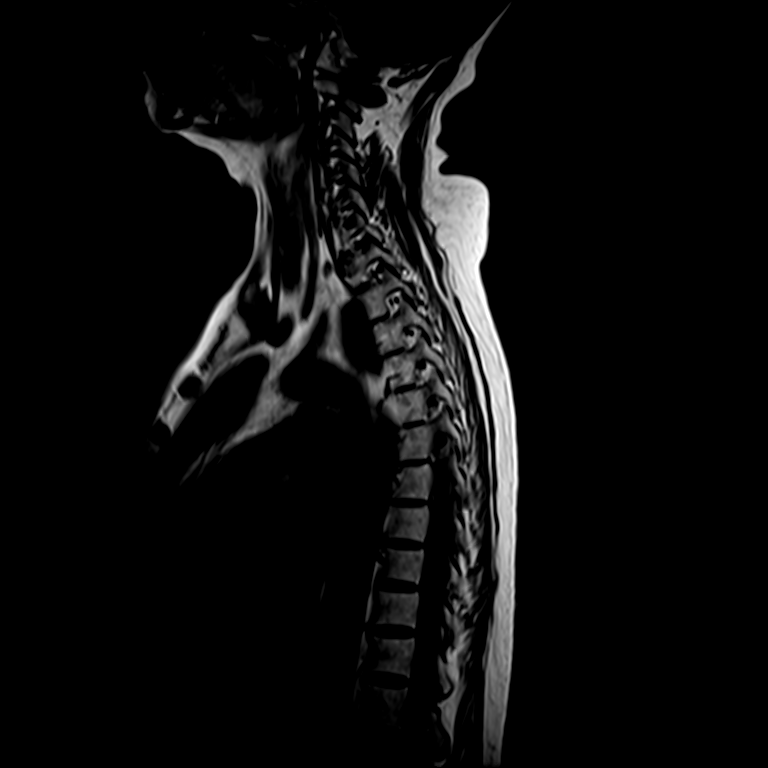
[im 13/13]
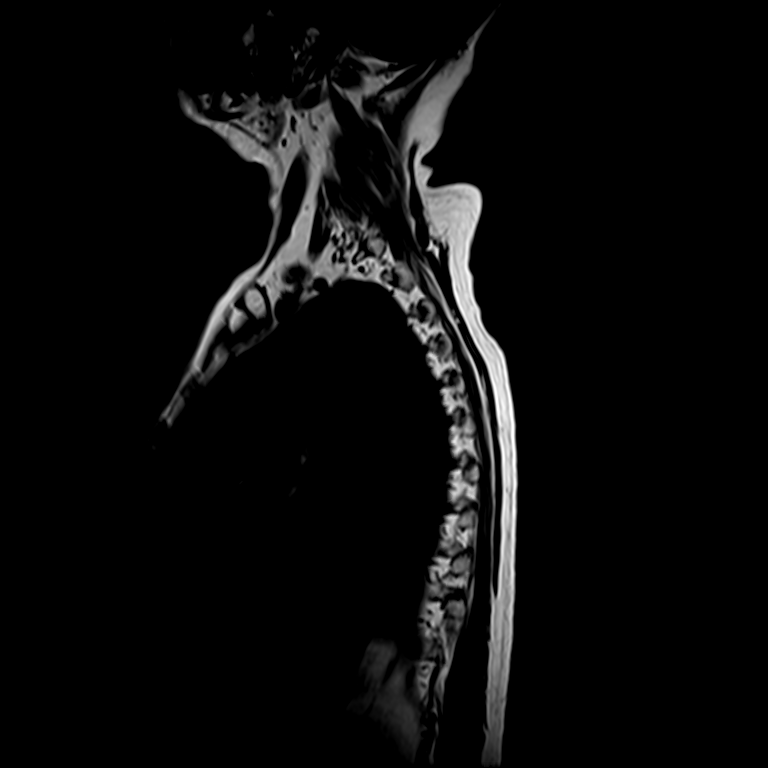

[Series 6: T1 · sagittal · 4.0mm · 0.38mm/px · 1 of 13 slices shown (2 of 2)]
[im 1/13]
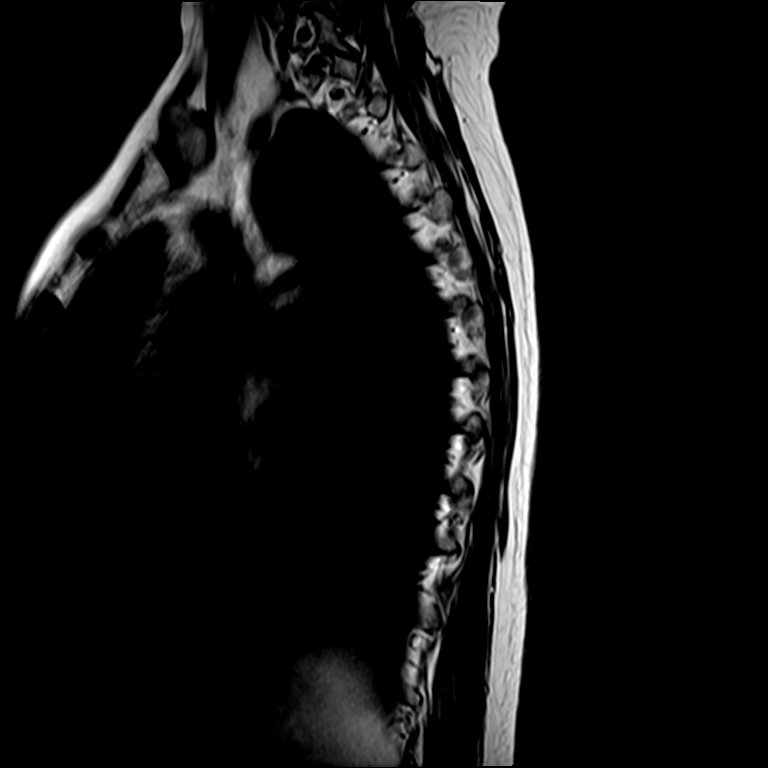

[Series 8: T2 · sagittal · 4.0mm · 0.38mm/px · 3 of 13 slices shown (1 of 2)]
[im 1/13]
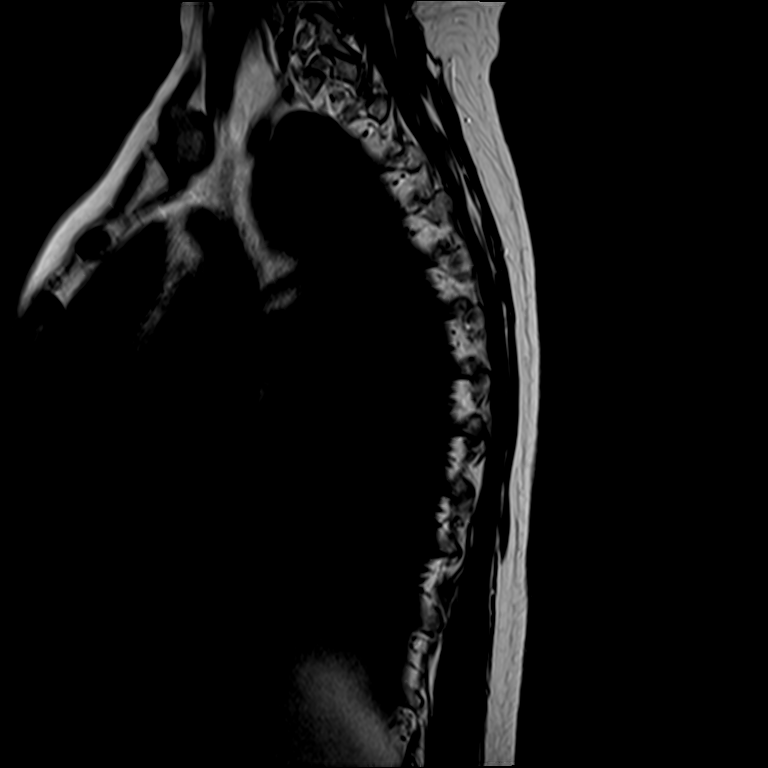
[im 7/13]
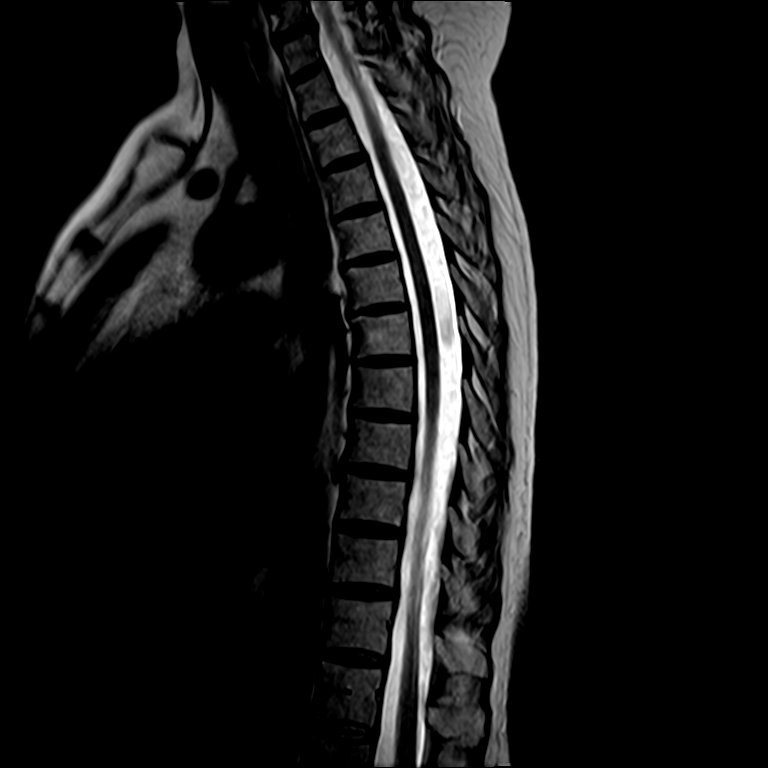
[im 13/13]
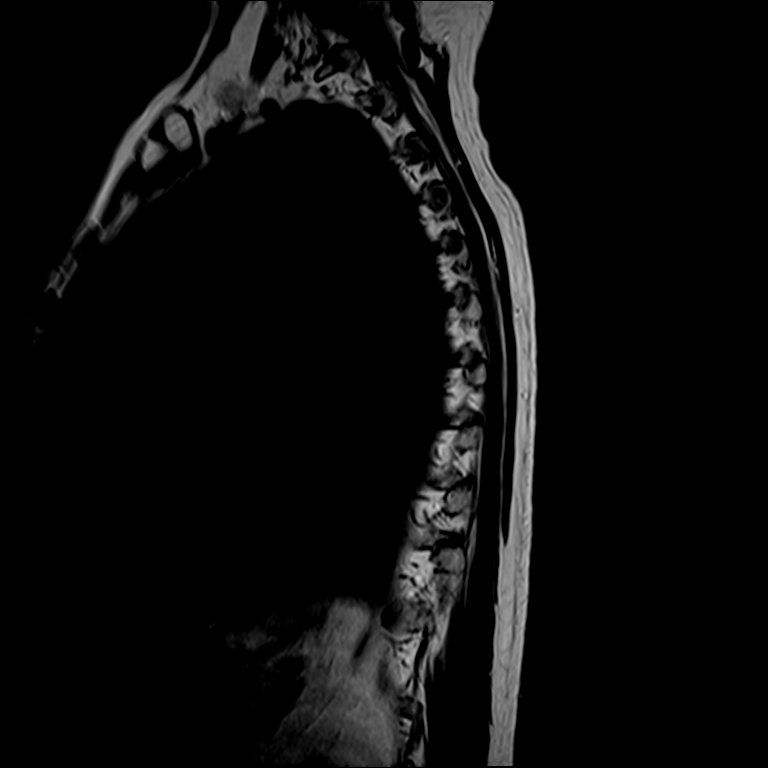

[Series 9: T2 · axial · 3.0mm · 0.30mm/px · z∈[-235,-106]mm · 3 of 36 slices shown (2 of 2)]
[im 6/36]
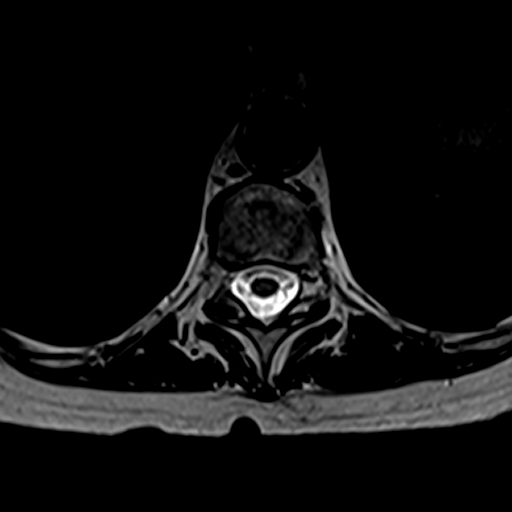
[im 19/36]
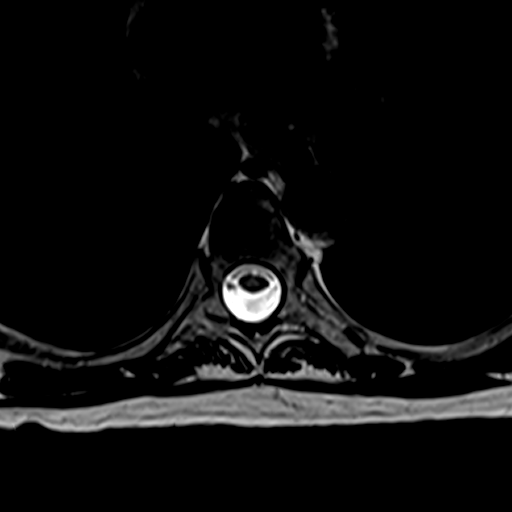
[im 30/36]
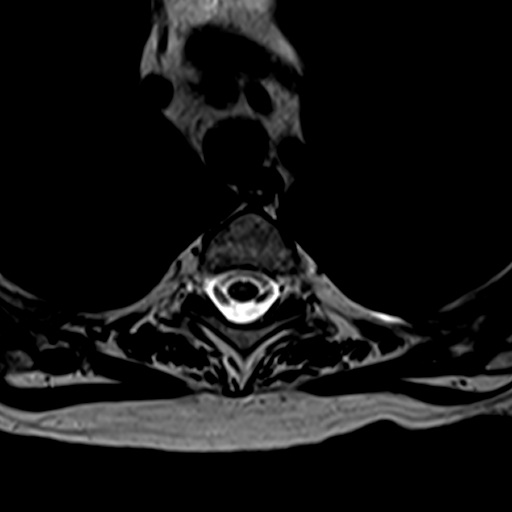

[10 of 48 positions shown; findings below may reference images not displayed]

FINDINGS: MRI THORACIC SPINE FINDINGS

Alignment:  Physiologic.

Vertebrae: No fracture, evidence of discitis, or bone lesion.

Cord:  Normal signal and morphology.

Paraspinal and other soft tissues: There are a few small pulmonary
nodules in the right upper lobe measuring up to 5 mm. Otherwise
negative.

Disc levels:

No significant disc bulge or herniation. No spinal canal or
neuroforaminal stenosis.

MRI LUMBAR SPINE FINDINGS

Segmentation:  Standard.

Alignment:  Physiologic.

Vertebrae:  No fracture, evidence of discitis, or bone lesion.

Conus medullaris and cauda equina: Conus extends to the L1-L2 level.
Conus and cauda equina appear normal.

Paraspinal and other soft tissues: Negative.

Disc levels:

T12-L1 to L3-L4: No significant disc bulge or herniation. No spinal
canal or neuroforaminal stenosis.

L4-L5: Minimal disc bulging. Mild right and minimal left facet
arthropathy. No stenosis.

L5-S1: Negative disc. Mild bilateral facet arthropathy. No stenosis.
IMPRESSION: Thoracic spine:

1. No acute osseous abnormality or significant degenerative changes.
2. There are few small pulmonary nodules in the right upper lobe
measuring up to 5 mm. Recommend non-emergent CT chest without
contrast for further evaluation.

Lumbar spine:

1.  No acute osseous abnormality.
2. Mild lower lumbar facet arthropathy.  No stenosis or impingement.

## 2020-09-20 IMAGING — MR MRI LUMBAR SPINE WITHOUT CONTRAST
4 of 5 series · 13 of 48 positions shown · non-contrast
Comparison: Thoracic and lumbar spine x-rays dated December 28, 2018.

CLINICAL DATA: Acute midline thoracic back pain. Chronic bilateral
lower back pain without radiculopathy.

EXAM:
MRI THORACIC AND LUMBAR SPINE WITHOUT CONTRAST
TECHNIQUE: Multiplanar and multiecho pulse sequences of the thoracic and lumbar
spine were obtained without intravenous contrast.

[Series 2: T2 · sagittal · 4.0mm · 0.40mm/px · 4 of 15 slices shown (1 of 2)]
[im 1/15]
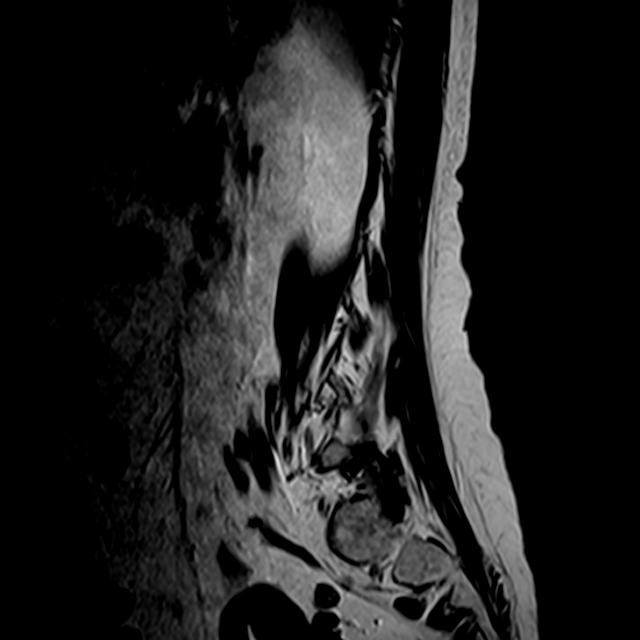
[im 4/15]
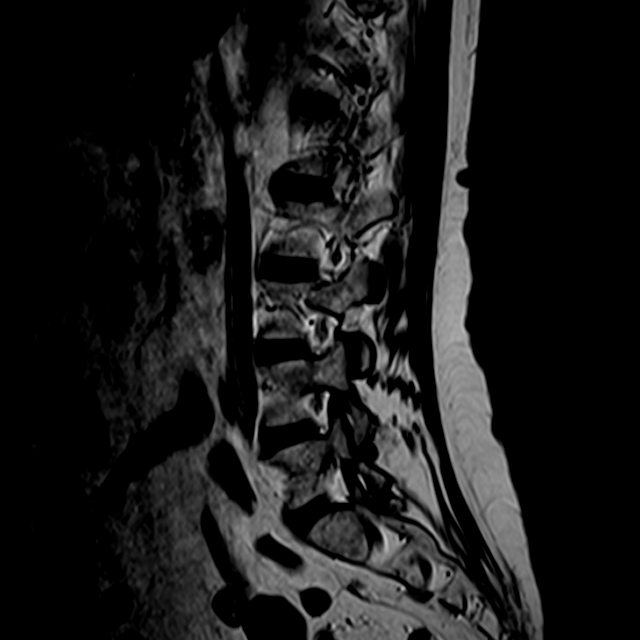
[im 8/15]
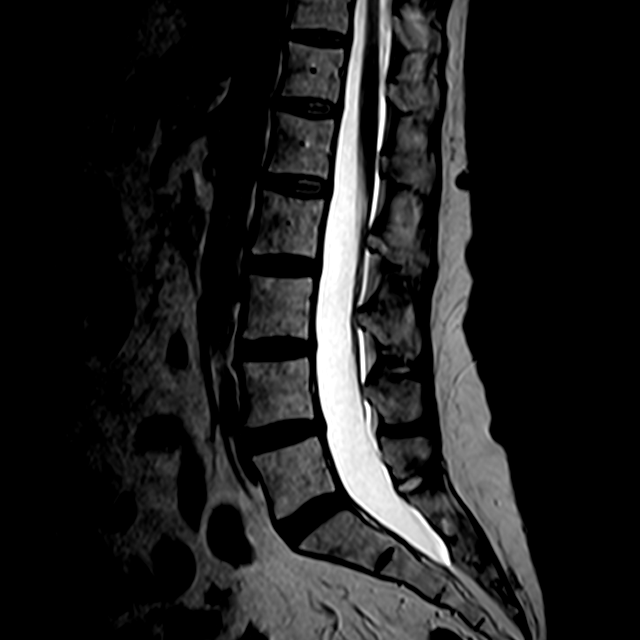
[im 15/15]
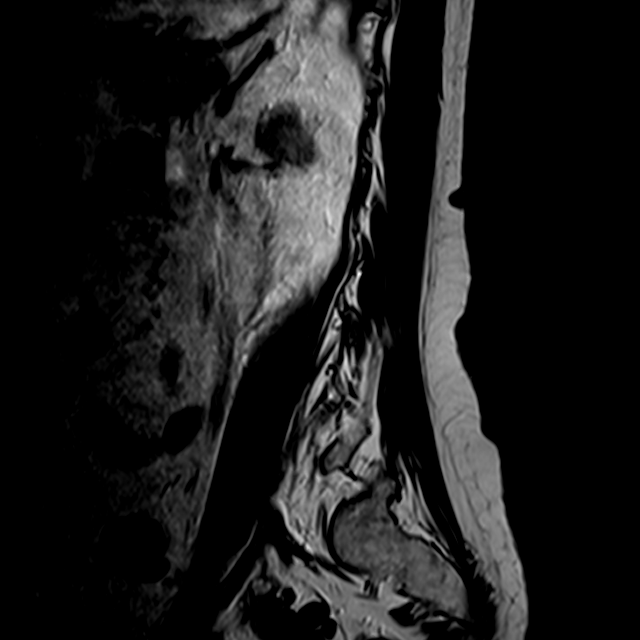

[Series 3: T1 · sagittal · 4.0mm · 0.40mm/px · 3 of 15 slices shown (1 of 2)]
[im 3/15]
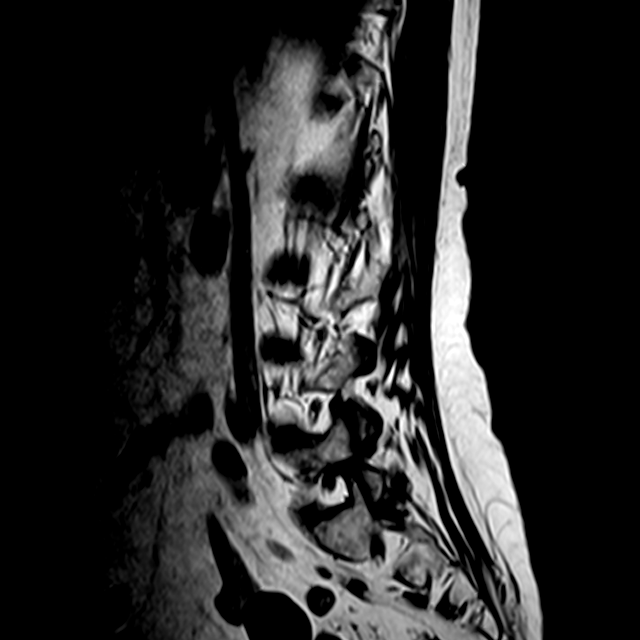
[im 9/15]
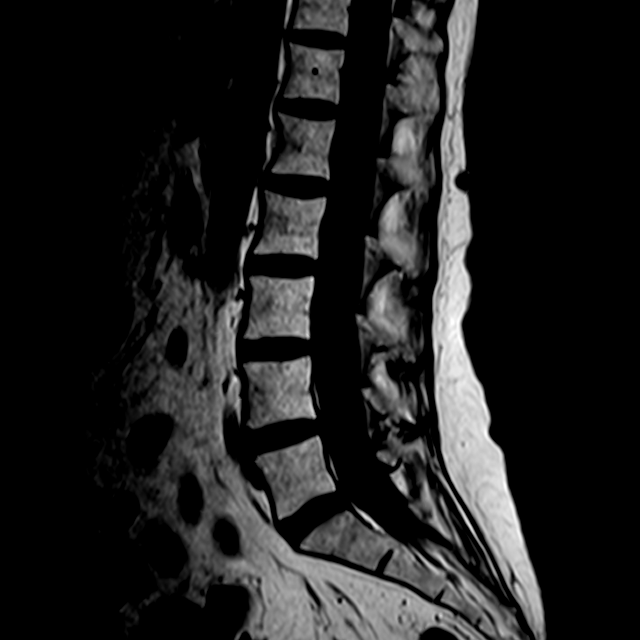
[im 15/15]
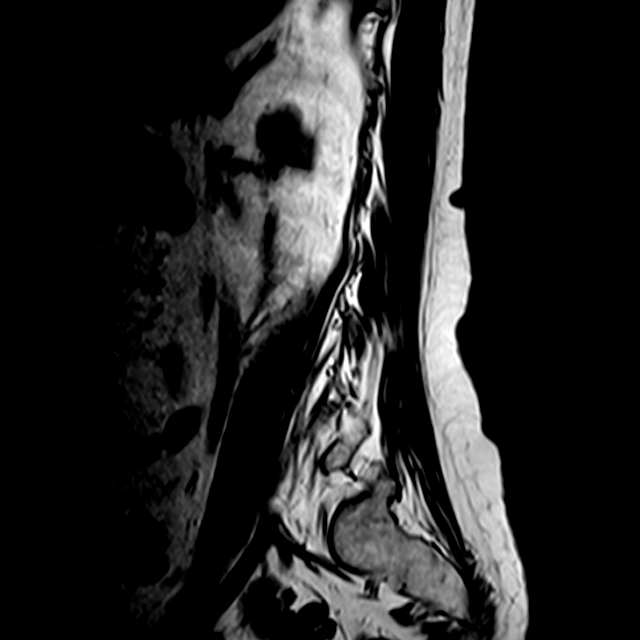

[Series 5: T2 · axial · 4.0mm · 0.26mm/px · z∈[-477,-347]mm · 3 of 38 slices shown (2 of 2)]
[im 6/38]
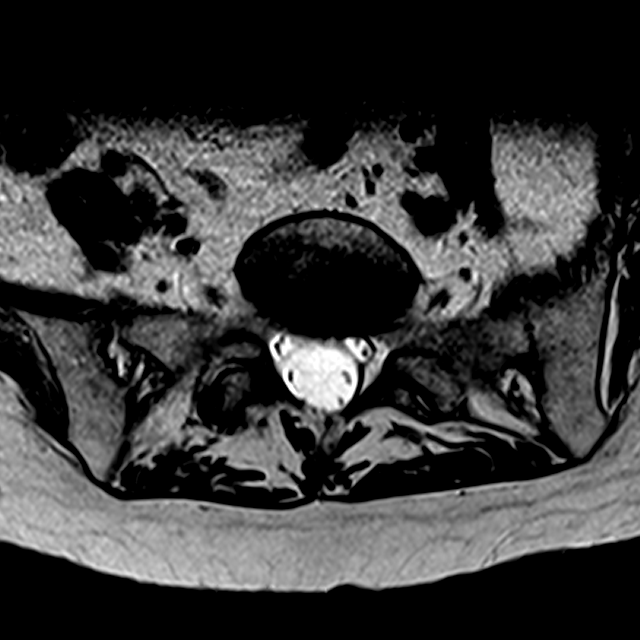
[im 19/38]
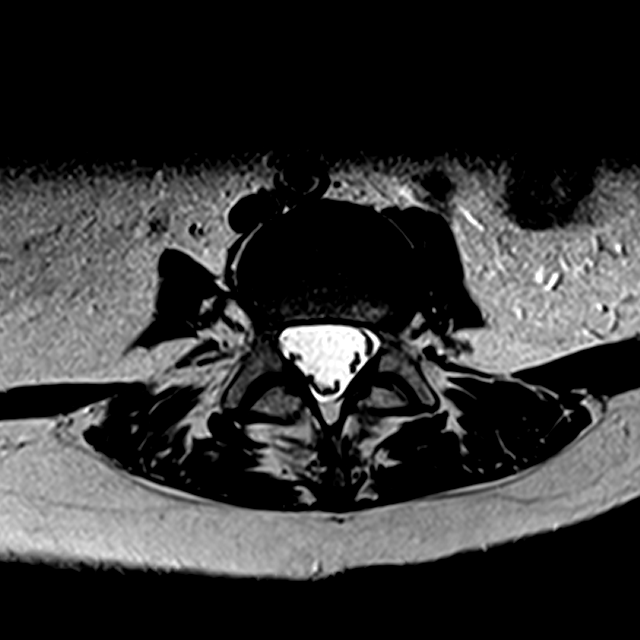
[im 32/38]
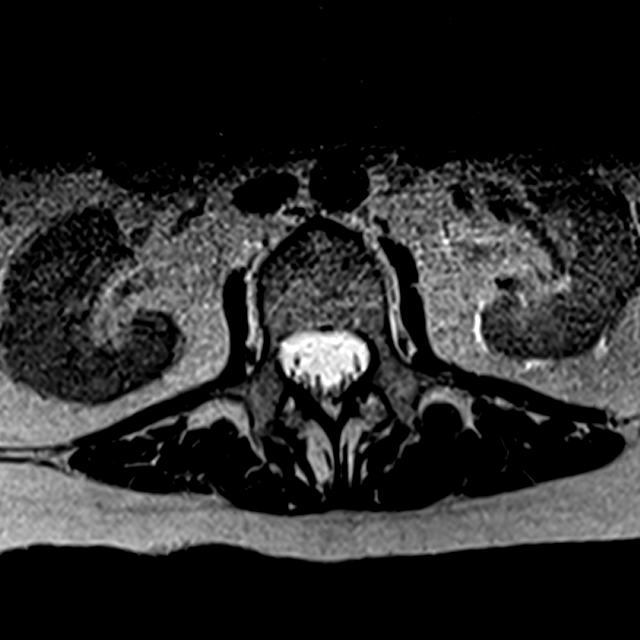

[Series 6: T1 · axial · 4.0mm · 0.26mm/px · z∈[-477,-347]mm · 3 of 38 slices shown (2 of 2)]
[im 6/38]
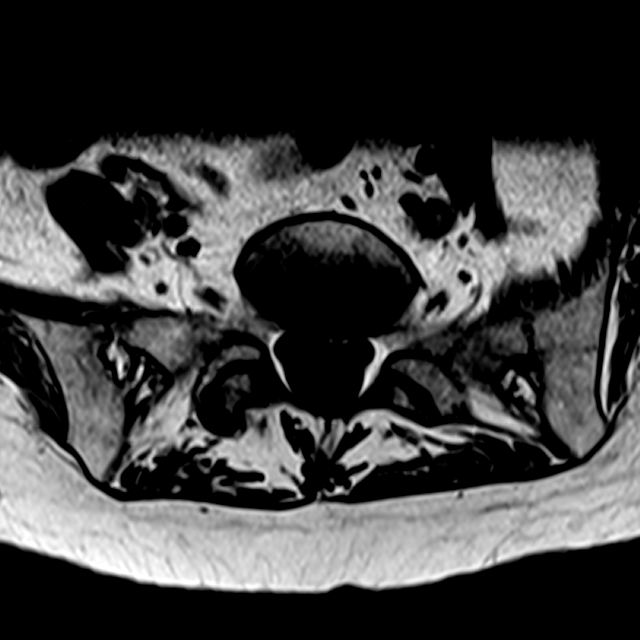
[im 19/38]
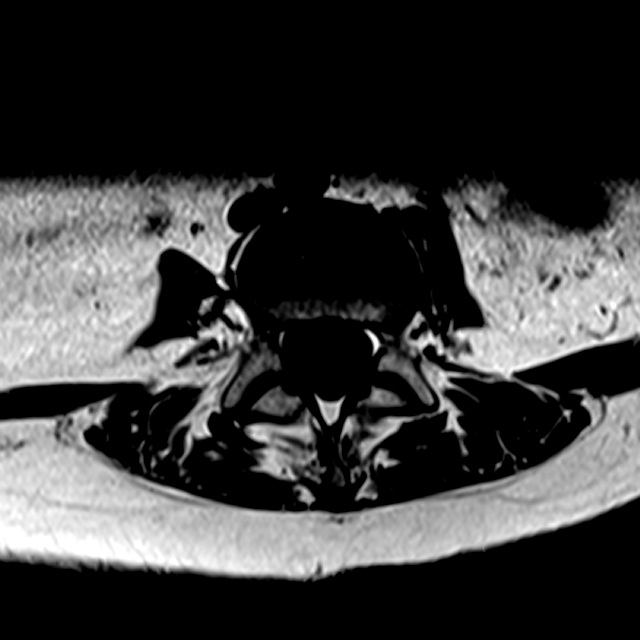
[im 32/38]
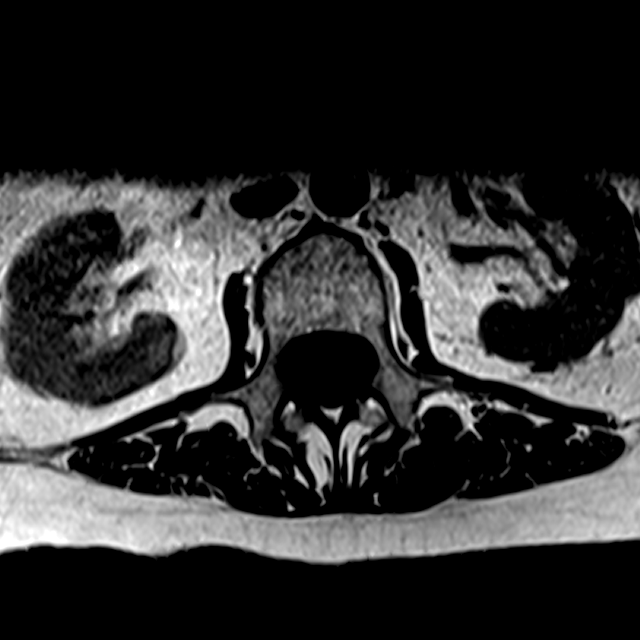

[13 of 48 positions shown; findings below may reference images not displayed]

FINDINGS: MRI THORACIC SPINE FINDINGS

Alignment:  Physiologic.

Vertebrae: No fracture, evidence of discitis, or bone lesion.

Cord:  Normal signal and morphology.

Paraspinal and other soft tissues: There are a few small pulmonary
nodules in the right upper lobe measuring up to 5 mm. Otherwise
negative.

Disc levels:

No significant disc bulge or herniation. No spinal canal or
neuroforaminal stenosis.

MRI LUMBAR SPINE FINDINGS

Segmentation:  Standard.

Alignment:  Physiologic.

Vertebrae:  No fracture, evidence of discitis, or bone lesion.

Conus medullaris and cauda equina: Conus extends to the L1-L2 level.
Conus and cauda equina appear normal.

Paraspinal and other soft tissues: Negative.

Disc levels:

T12-L1 to L3-L4: No significant disc bulge or herniation. No spinal
canal or neuroforaminal stenosis.

L4-L5: Minimal disc bulging. Mild right and minimal left facet
arthropathy. No stenosis.

L5-S1: Negative disc. Mild bilateral facet arthropathy. No stenosis.
IMPRESSION: Thoracic spine:

1. No acute osseous abnormality or significant degenerative changes.
2. There are few small pulmonary nodules in the right upper lobe
measuring up to 5 mm. Recommend non-emergent CT chest without
contrast for further evaluation.

Lumbar spine:

1.  No acute osseous abnormality.
2. Mild lower lumbar facet arthropathy.  No stenosis or impingement.
